# Patient Record
Sex: Female | Born: 1975 | Race: Black or African American | Hispanic: No | Marital: Married | Smoking: Former smoker
Health system: Southern US, Community
[De-identification: ages and names within clinical notes are randomized; demographics above are authoritative.]

## PROBLEM LIST (undated history)

## (undated) DIAGNOSIS — F329 Major depressive disorder, single episode, unspecified: Secondary | ICD-10-CM

## (undated) DIAGNOSIS — N879 Dysplasia of cervix uteri, unspecified: Secondary | ICD-10-CM

## (undated) DIAGNOSIS — R7303 Prediabetes: Secondary | ICD-10-CM

## (undated) DIAGNOSIS — R002 Palpitations: Secondary | ICD-10-CM

## (undated) DIAGNOSIS — D219 Benign neoplasm of connective and other soft tissue, unspecified: Secondary | ICD-10-CM

## (undated) DIAGNOSIS — F419 Anxiety disorder, unspecified: Secondary | ICD-10-CM

## (undated) DIAGNOSIS — Z8041 Family history of malignant neoplasm of ovary: Secondary | ICD-10-CM

## (undated) DIAGNOSIS — E559 Vitamin D deficiency, unspecified: Secondary | ICD-10-CM

## (undated) DIAGNOSIS — F32A Depression, unspecified: Secondary | ICD-10-CM

## (undated) HISTORY — DX: Family history of malignant neoplasm of ovary: Z80.41

## (undated) HISTORY — DX: Palpitations: R00.2

## (undated) HISTORY — DX: Depression, unspecified: F32.A

## (undated) HISTORY — DX: Prediabetes: R73.03

## (undated) HISTORY — PX: LEEP: SHX91

## (undated) HISTORY — DX: Benign neoplasm of connective and other soft tissue, unspecified: D21.9

## (undated) HISTORY — DX: Dysplasia of cervix uteri, unspecified: N87.9

## (undated) HISTORY — DX: Major depressive disorder, single episode, unspecified: F32.9

## (undated) HISTORY — DX: Anxiety disorder, unspecified: F41.9

## (undated) HISTORY — PX: COLPOSCOPY: SHX161

## (undated) HISTORY — DX: Vitamin D deficiency, unspecified: E55.9

---

## 2004-02-09 ENCOUNTER — Ambulatory Visit: Payer: Self-pay | Admitting: Family Medicine

## 2004-08-28 ENCOUNTER — Emergency Department: Payer: Self-pay | Admitting: Emergency Medicine

## 2006-05-16 ENCOUNTER — Ambulatory Visit: Payer: Self-pay | Admitting: Emergency Medicine

## 2006-05-16 IMAGING — CR RIGHT FOURTH TOE
1 series · 3 of 3 positions shown · non-contrast
Comparison: none

REASON FOR EXAM: injury
COMMENTS:   LMP: N/A

[Series 1: view not recorded · 0.17mm/px · 3 of 3 slices shown]
[im 1/3]
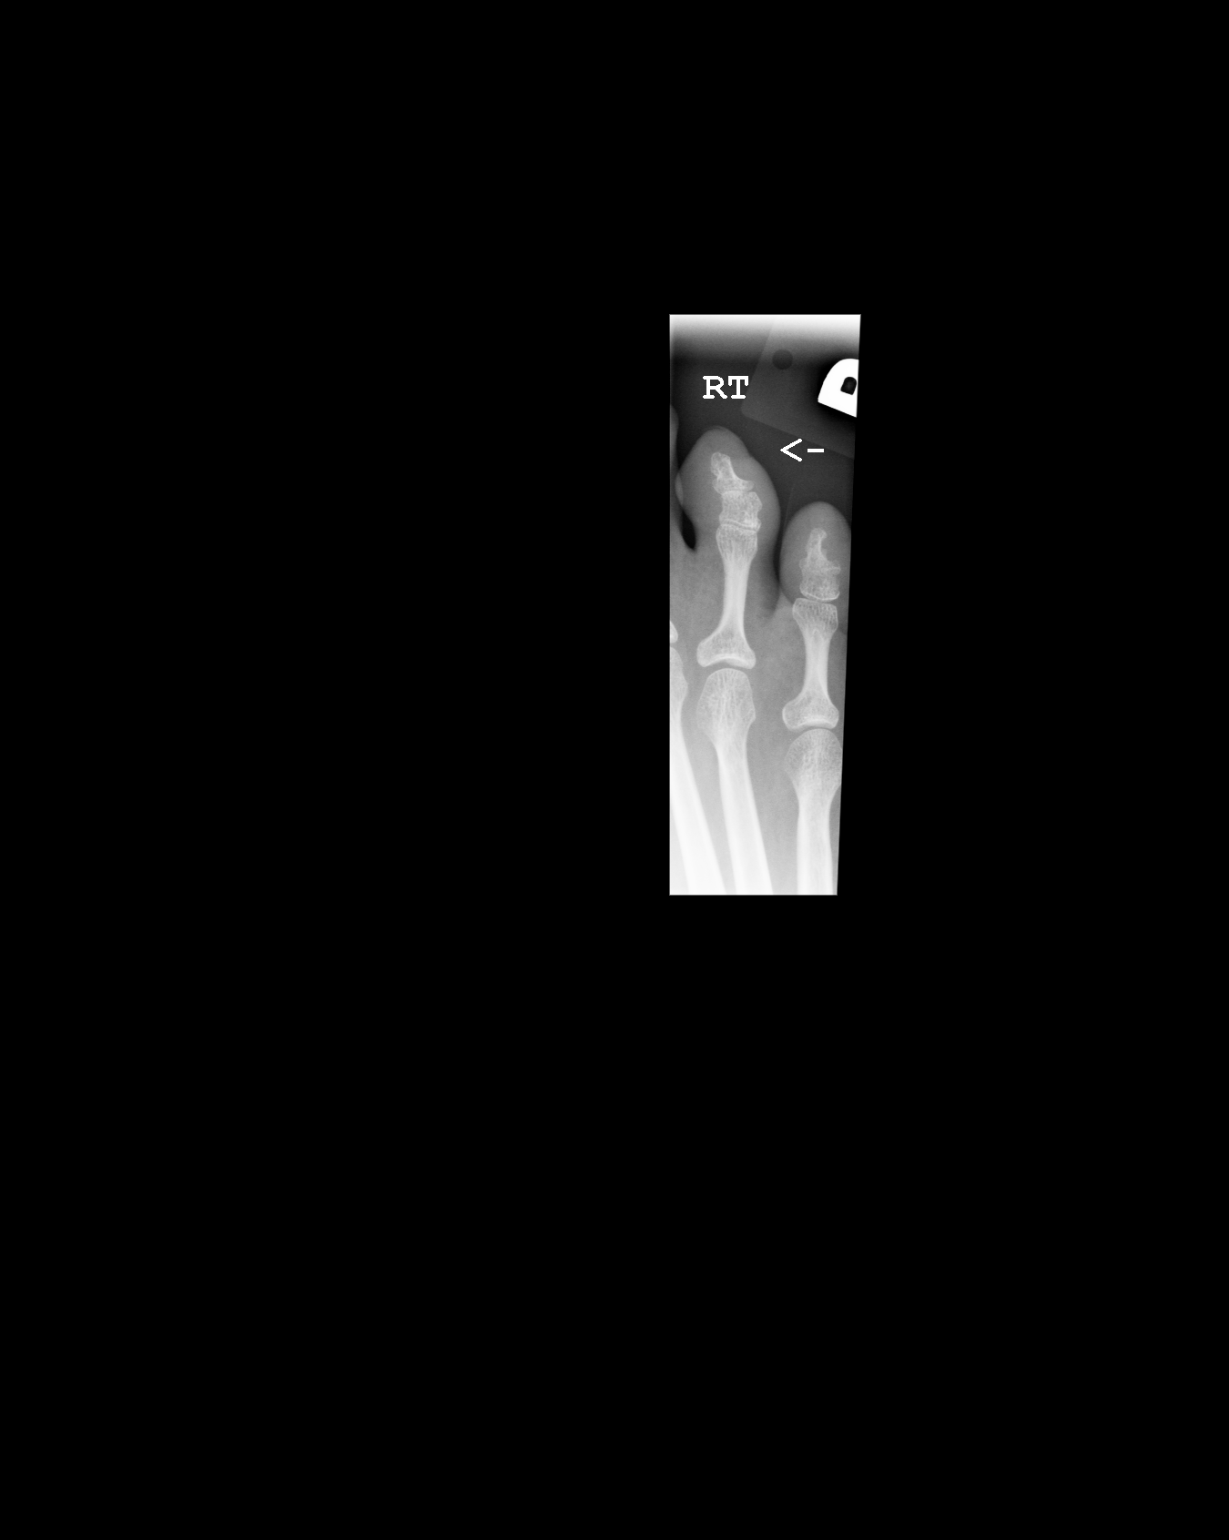
[im 2/3]
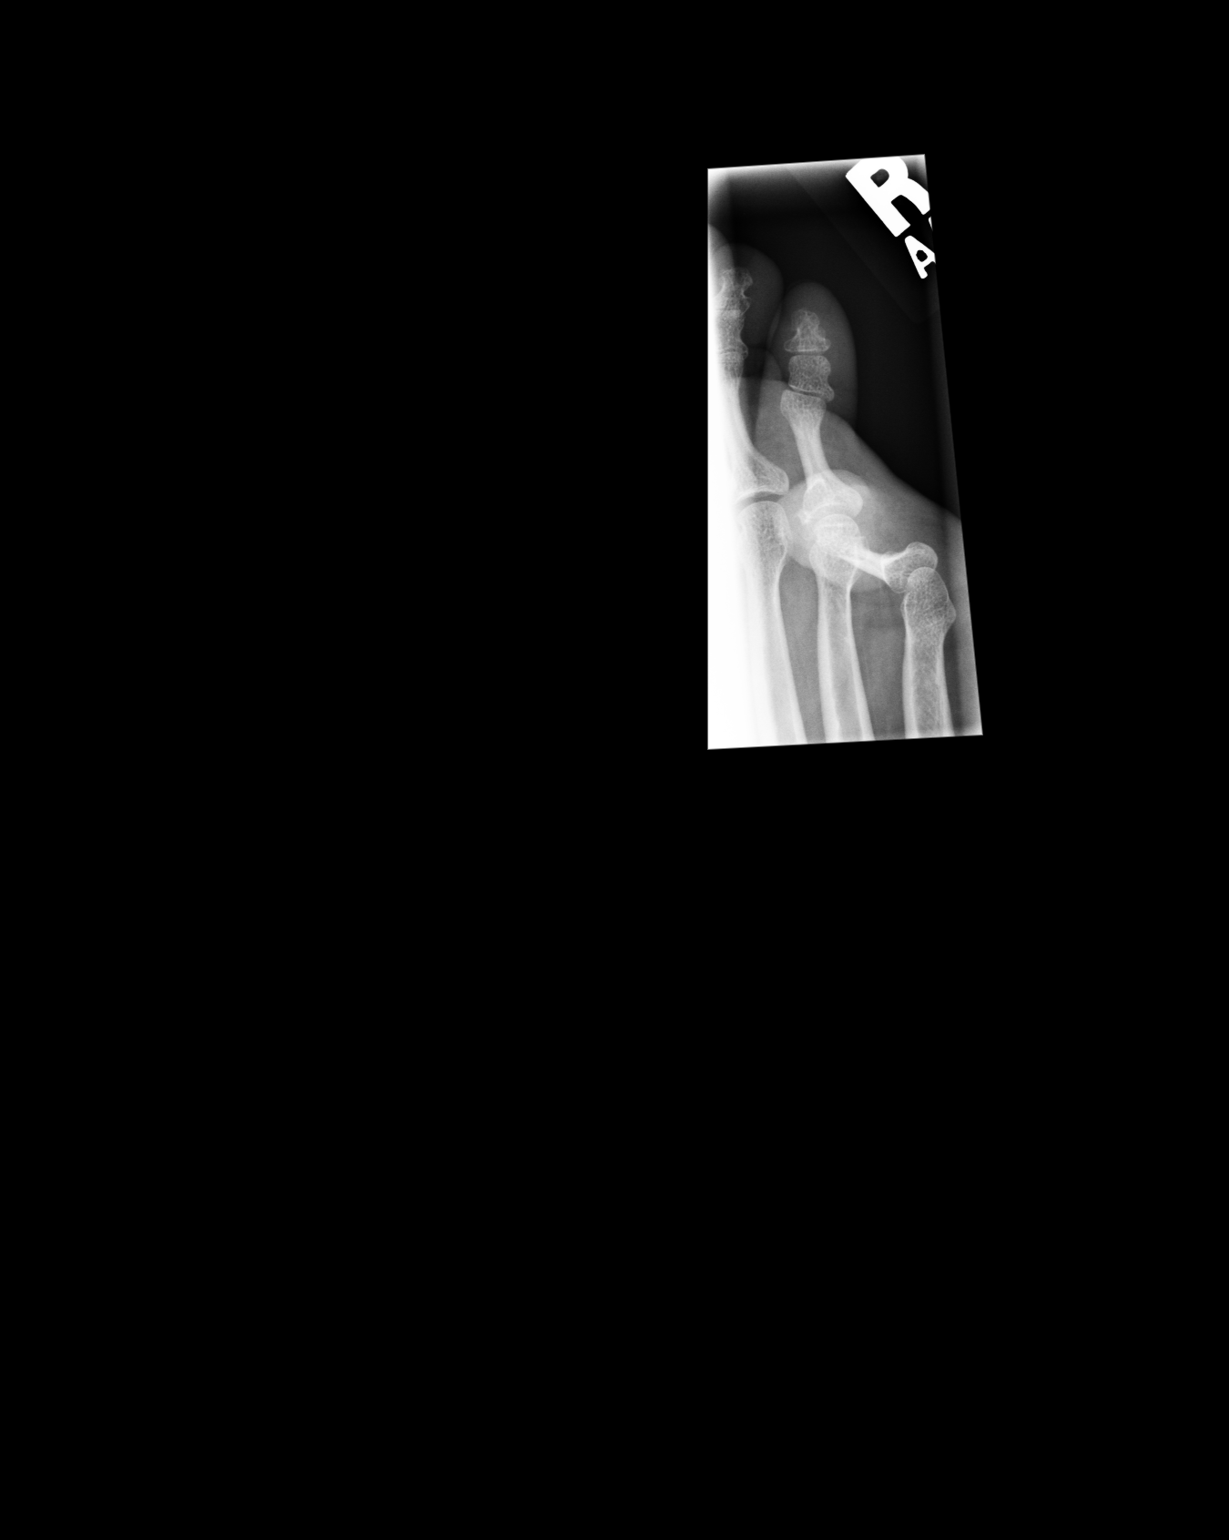
[im 3/3]
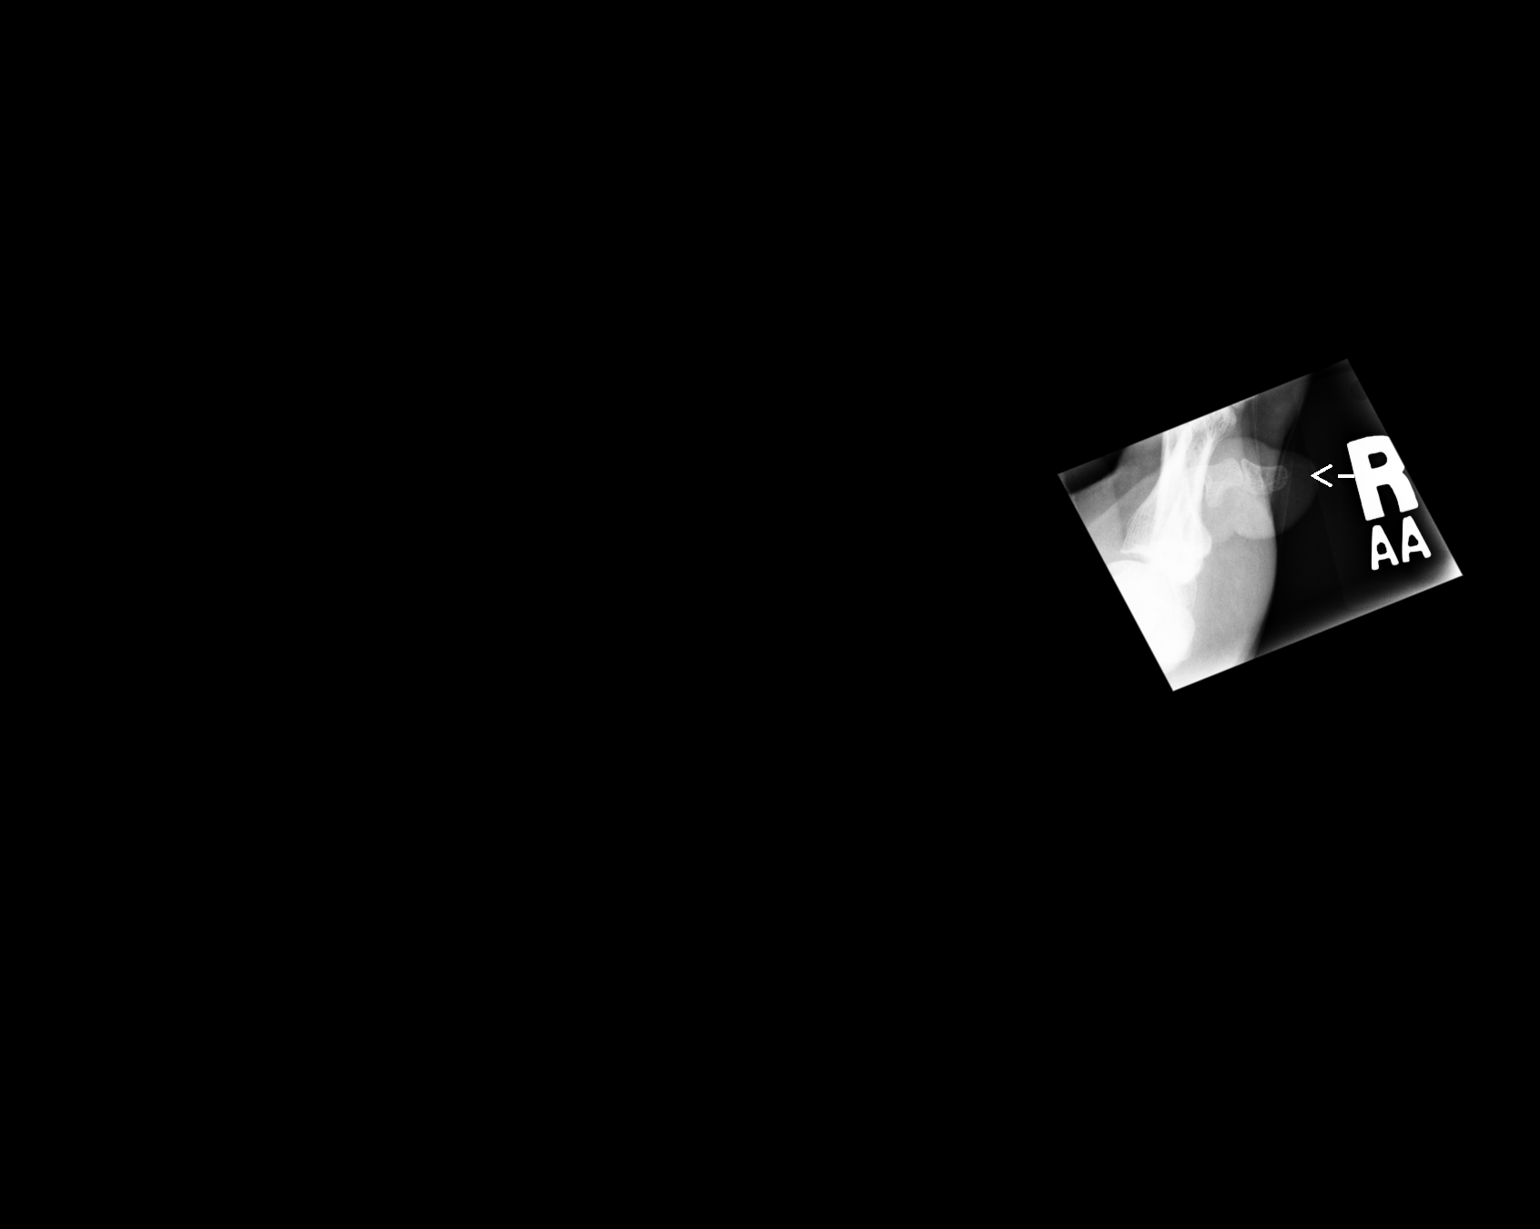

[3 of 3 positions shown; findings below may reference images not displayed]

PROCEDURE:     MDR - MDR TOE 4TH DIGIT RIGHT FOOT  - [DATE]  [DATE]

RESULT:     There does not appear to be evidence of fracture, dislocation or
malalignment.  If there are persistent complaints of pain or persistent
clinical concern, a repeat evaluation in 7-10 days is recommended if
clinically warranted.
IMPRESSION: No radiographic evidence of an overt fracture. A radiooccult fracture cannot
be excluded particularly if of clinical concern. A repeat evaluation can be
obtained in 7-10 days if clinically warranted.

## 2006-06-15 ENCOUNTER — Ambulatory Visit: Payer: Self-pay | Admitting: Emergency Medicine

## 2006-09-21 ENCOUNTER — Ambulatory Visit: Payer: Self-pay | Admitting: Internal Medicine

## 2007-03-12 ENCOUNTER — Ambulatory Visit: Payer: Self-pay | Admitting: Obstetrics and Gynecology

## 2007-03-12 ENCOUNTER — Ambulatory Visit: Payer: Self-pay | Admitting: Family Medicine

## 2007-03-12 IMAGING — US US PELV - US TRANSVAGINAL
1 series · 17 of 25 positions shown · non-contrast
Comparison: none

REASON FOR EXAM: pel pain
COMMENTS:

[Series 1: us pelv - us transvaginal · 17 of 58 slices shown]
[im 1/58]
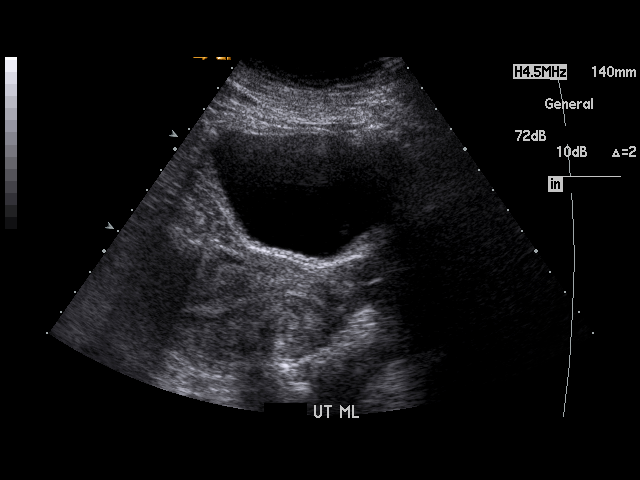
[im 5/58]
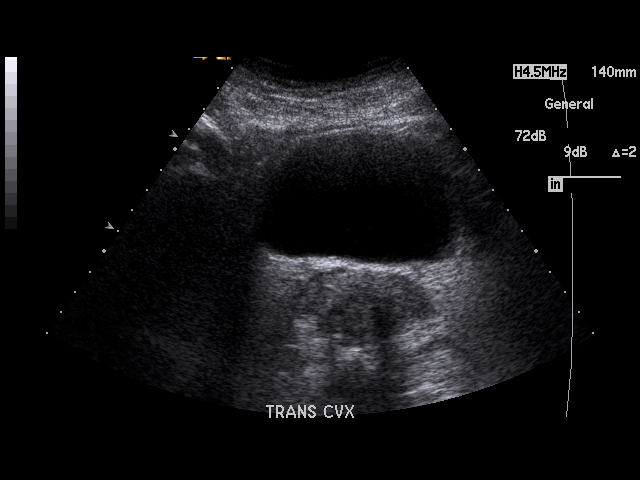
[im 8/58]
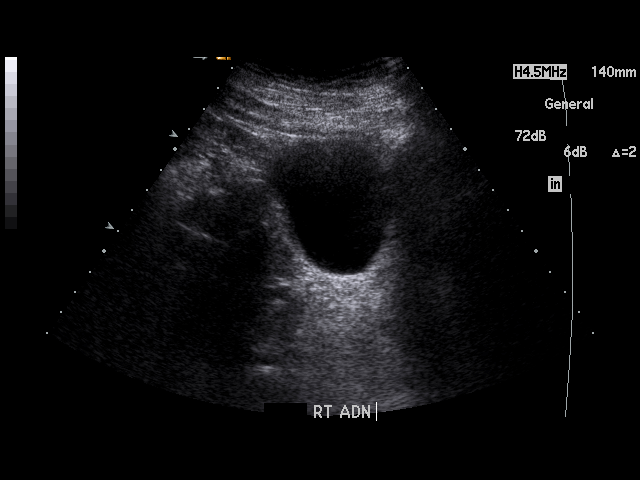
[im 12/58]
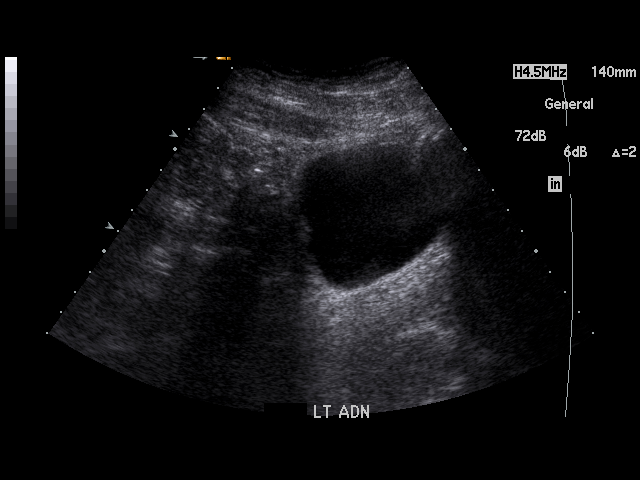
[im 15/58]
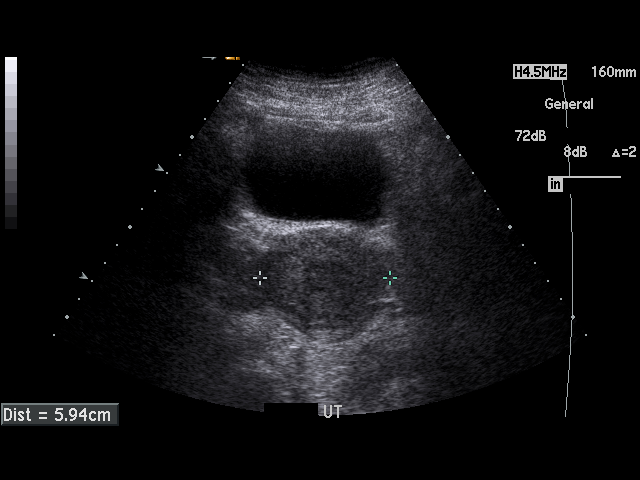
[im 20/58]
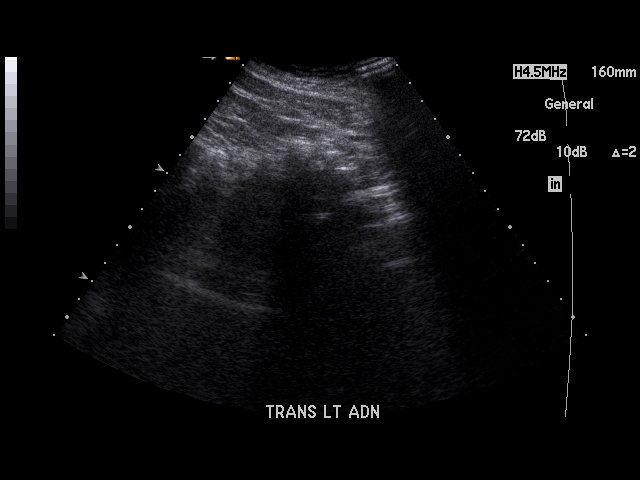
[im 22/58]
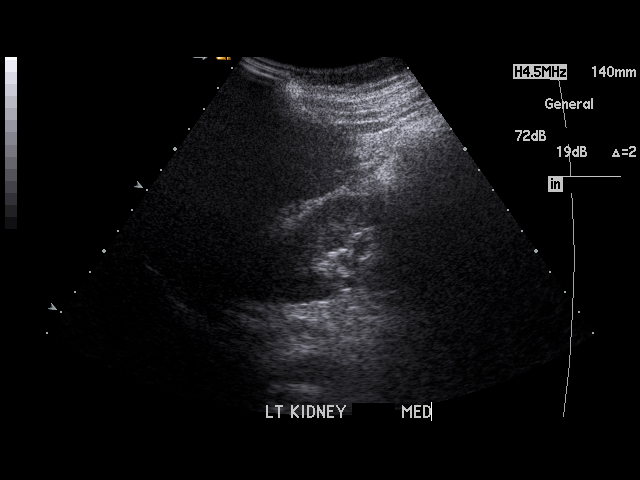
[im 27/58]
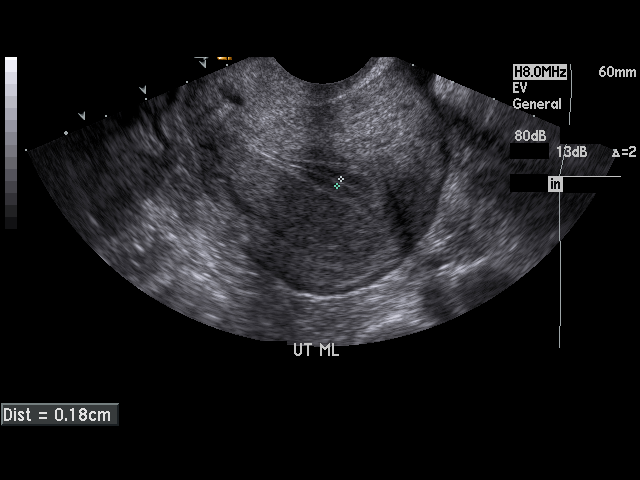
[im 29/58]
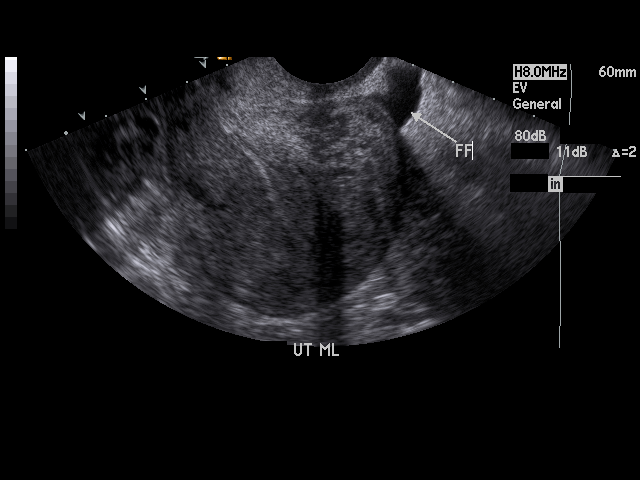
[im 31/58]
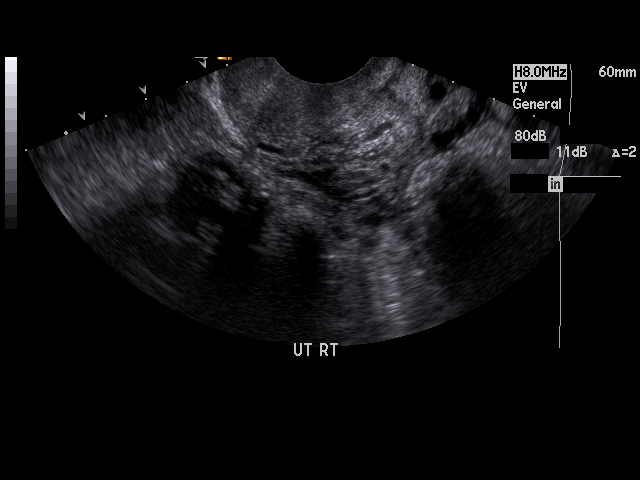
[im 36/58]
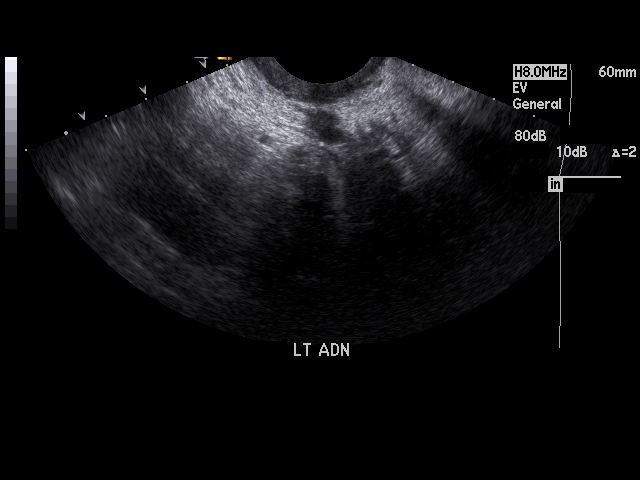
[im 39/58]
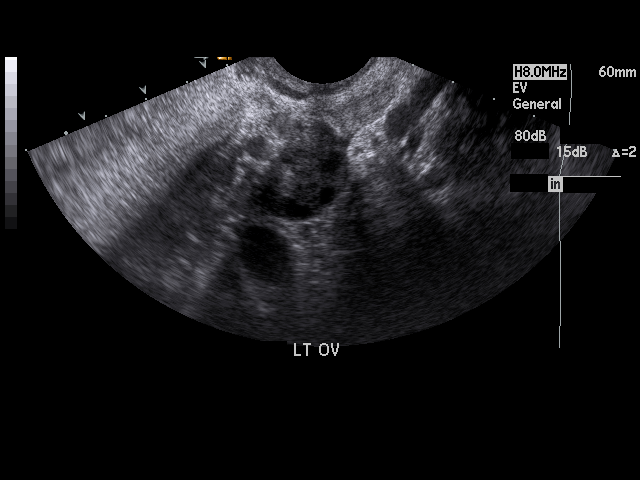
[im 43/58]
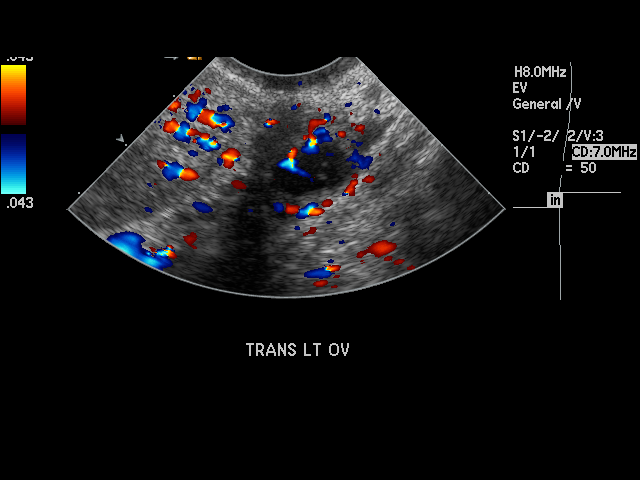
[im 46/58]
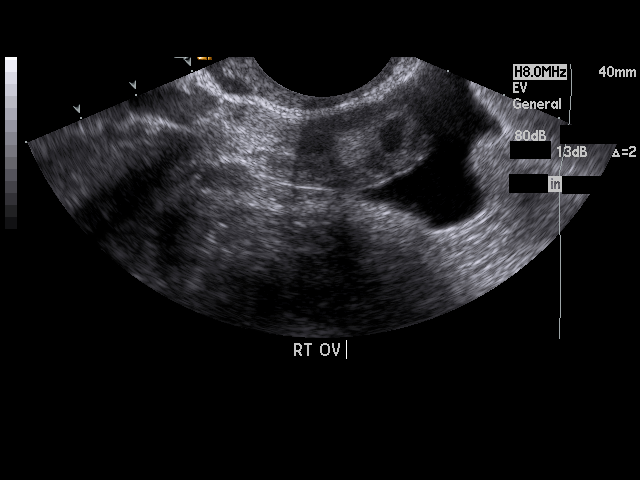
[im 50/58]
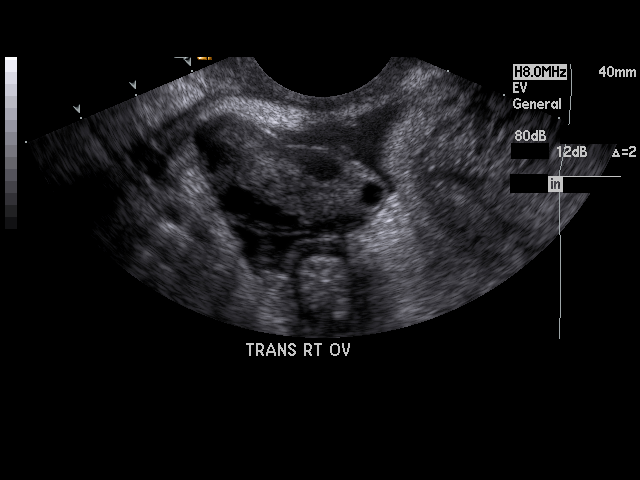
[im 53/58]
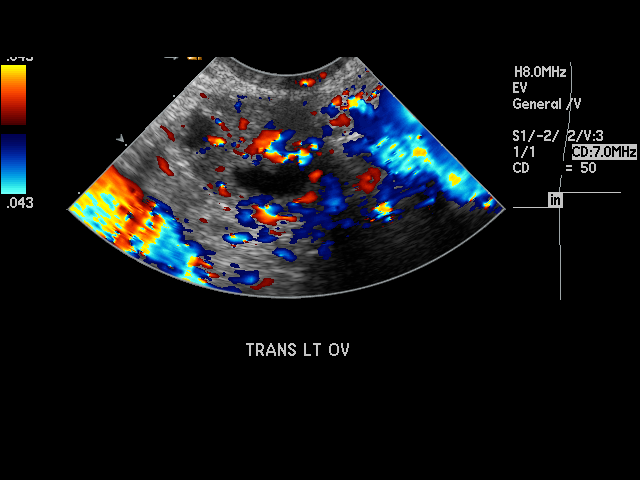
[im 58/58]
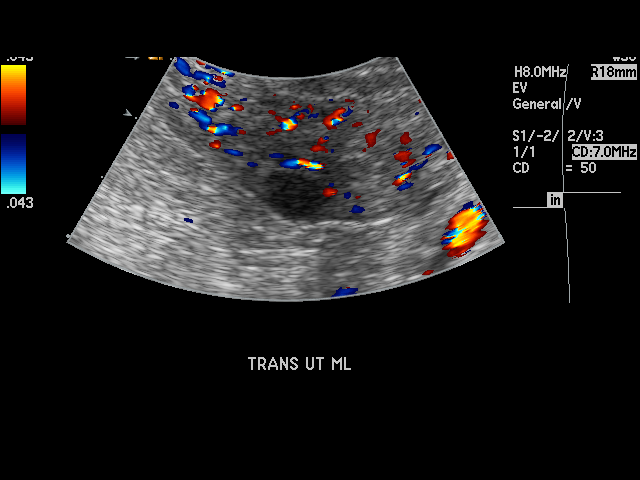

[17 of 25 positions shown; findings below may reference images not displayed]

PROCEDURE:     US  - US PELVIS MASS EXAM  - [DATE]  [DATE] [DATE]  [DATE]

RESULT:     Transabdominal and endovaginal ultrasound was performed.  The
uterus measures 7.93 cm x 5.94 cm x 4.58 cm. There is a 7.3 mm hypoechoic
mass posteriorly in the uterine fundus consistent with a small uterine
fibroid.  The RIGHT and LEFT ovaries are visualized. The RIGHT ovary
measures 3.06 cm at maximum diameter and the LEFT ovary measures 2.5 cm at
maximum diameter. There is a 1.79 cm complex cyst of the LEFT ovary. The
etiology for this is uncertain but a corpus luteal cyst would be one
consideration. There is a nonspecific small amount of free fluid in the
pelvis. The kidneys show no hydronephrosis.
IMPRESSION: 1. The uterus appears normal in size and is retroverted.
2. There is a 7.3 mm hypoechoic mass posteriorly in the uterus compatible
with a tiny uterine fibroid.
3. There is a nonspecific complex cyst of the LEFT ovary measuring 1.79 cm
at maximum diameter.
4. There is nonspecific small amount of free fluid in the pelvis.

## 2007-05-04 ENCOUNTER — Ambulatory Visit: Payer: Self-pay | Admitting: Family Medicine

## 2007-10-28 ENCOUNTER — Ambulatory Visit: Payer: Self-pay | Admitting: Family Medicine

## 2007-12-17 ENCOUNTER — Ambulatory Visit: Payer: Self-pay | Admitting: Internal Medicine

## 2008-02-29 ENCOUNTER — Ambulatory Visit: Payer: Self-pay | Admitting: Internal Medicine

## 2008-03-07 ENCOUNTER — Ambulatory Visit: Payer: Self-pay | Admitting: Internal Medicine

## 2008-10-12 ENCOUNTER — Ambulatory Visit: Payer: Self-pay | Admitting: Internal Medicine

## 2009-04-02 ENCOUNTER — Ambulatory Visit: Payer: Self-pay | Admitting: Internal Medicine

## 2009-04-13 ENCOUNTER — Ambulatory Visit: Payer: Self-pay | Admitting: Internal Medicine

## 2009-11-14 ENCOUNTER — Ambulatory Visit: Payer: Self-pay | Admitting: Internal Medicine

## 2009-12-27 ENCOUNTER — Ambulatory Visit: Payer: Self-pay | Admitting: Family Medicine

## 2012-01-08 DIAGNOSIS — D219 Benign neoplasm of connective and other soft tissue, unspecified: Secondary | ICD-10-CM

## 2012-01-08 HISTORY — DX: Benign neoplasm of connective and other soft tissue, unspecified: D21.9

## 2014-01-02 ENCOUNTER — Ambulatory Visit: Payer: Self-pay | Admitting: Family Medicine

## 2014-01-02 LAB — RAPID STREP-A WITH REFLX: MICRO TEXT REPORT: NEGATIVE

## 2014-01-05 LAB — BETA STREP CULTURE(ARMC)

## 2015-02-05 ENCOUNTER — Ambulatory Visit
Admission: EM | Admit: 2015-02-05 | Discharge: 2015-02-05 | Disposition: A | Discharge status: Home / Self Care | Payer: Federal, State, Local not specified - PPO | Attending: Family Medicine | Admitting: Family Medicine

## 2015-02-05 ENCOUNTER — Encounter: Payer: Self-pay | Admitting: Gynecology

## 2015-02-05 DIAGNOSIS — H6593 Unspecified nonsuppurative otitis media, bilateral: Secondary | ICD-10-CM | POA: Diagnosis not present

## 2015-02-05 DIAGNOSIS — J019 Acute sinusitis, unspecified: Secondary | ICD-10-CM

## 2015-02-05 DIAGNOSIS — J039 Acute tonsillitis, unspecified: Secondary | ICD-10-CM

## 2015-02-05 LAB — RAPID STREP SCREEN (MED CTR MEBANE ONLY): STREPTOCOCCUS, GROUP A SCREEN (DIRECT): NEGATIVE

## 2015-02-05 MED ORDER — PREDNISONE 20 MG PO TABS: 40.0000 mg | ORAL_TABLET | Freq: Every day | ORAL | Status: DC

## 2015-02-05 MED ORDER — AMOXICILLIN-POT CLAVULANATE 875-125 MG PO TABS: 1.0000 | ORAL_TABLET | Freq: Two times a day (BID) | ORAL | Status: AC

## 2015-02-05 MED ORDER — ACETAMINOPHEN 500 MG PO TABS: 1000.0000 mg | ORAL_TABLET | Freq: Four times a day (QID) | ORAL | Status: DC | PRN

## 2015-02-05 MED ORDER — FLUTICASONE PROPIONATE 50 MCG/ACT NA SUSP: 1.0000 | Freq: Two times a day (BID) | NASAL | Status: DC

## 2015-02-05 MED ORDER — FIRST-DUKES MOUTHWASH MT SUSP: 15.0000 mL | Freq: Three times a day (TID) | OROMUCOSAL | Status: AC | PRN

## 2015-02-05 MED ORDER — SALINE SPRAY 0.65 % NA SOLN: 2.0000 | NASAL | Status: DC

## 2015-02-05 NOTE — ED Notes (Signed)
Patient c/o sneezing  / nasal congestion /ear ache / facial pressure x 6 days.

## 2015-02-05 NOTE — Discharge Instructions (Signed)
Otitis Media With Effusion Otitis media with effusion is the presence of fluid in the middle ear. This is a common problem in children, which often follows ear infections. It may be present for weeks or longer after the infection. Unlike an acute ear infection, otitis media with effusion refers only to fluid behind the ear drum and not infection. Children with repeated ear and sinus infections and allergy problems are the most likely to get otitis media with effusion. CAUSES  The most frequent cause of the fluid buildup is dysfunction of the eustachian tubes. These are the tubes that drain fluid in the ears to the back of the nose (nasopharynx). SYMPTOMS   The main symptom of this condition is hearing loss. As a result, you or your child may:  Listen to the TV at a loud volume.  Not respond to questions.  Ask "what" often when spoken to.  Mistake or confuse one sound or word for another.  There may be a sensation of fullness or pressure but usually not pain. DIAGNOSIS   Your health care provider will diagnose this condition by examining you or your child's ears.  Your health care provider may test the pressure in you or your child's ear with a tympanometer.  A hearing test may be conducted if the problem persists. TREATMENT   Treatment depends on the duration and the effects of the effusion.  Antibiotics, decongestants, nose drops, and cortisone-type drugs (tablets or nasal spray) may not be helpful.  Children with persistent ear effusions may have delayed language or behavioral problems. Children at risk for developmental delays in hearing, learning, and speech may require referral to a specialist earlier than children not at risk.  You or your child's health care provider may suggest a referral to an ear, nose, and throat surgeon for treatment. The following may help restore normal hearing:  Drainage of fluid.  Placement of ear tubes (tympanostomy tubes).  Removal of adenoids  (adenoidectomy). HOME CARE INSTRUCTIONS   Avoid secondhand smoke.  Infants who are breastfed are less likely to have this condition.  Avoid feeding infants while they are lying flat.  Avoid known environmental allergens.  Avoid people who are sick. SEEK MEDICAL CARE IF:   Hearing is not better in 3 months.  Hearing is worse.  Ear pain.  Drainage from the ear.  Dizziness. MAKE SURE YOU:   Understand these instructions.  Will watch your condition.  Will get help right away if you are not doing well or get worse.   This information is not intended to replace advice given to you by your health care provider. Make sure you discuss any questions you have with your health care provider.   Document Released: 02/01/2004 Document Revised: 01/14/2014 Document Reviewed: 07/21/2012 Elsevier Interactive Patient Education 2016 [employer] Upper Respiratory Infection, Adult Most upper respiratory infections (URIs) are a viral infection of the air passages leading to the lungs. A URI affects the nose, throat, and upper air passages. The most common type of URI is nasopharyngitis and is typically referred to as "the common cold." URIs run their course and usually go away on their own. Most of the time, a URI does not require medical attention, but sometimes a bacterial infection in the upper airways can follow a viral infection. This is called a secondary infection. Sinus and middle ear infections are common types of secondary upper respiratory infections. Bacterial pneumonia can also complicate a URI. A URI can worsen asthma and chronic obstructive pulmonary disease (  COPD). Sometimes, these complications can require emergency medical care and may be life threatening.  CAUSES Almost all URIs are caused by viruses. A virus is a type of germ and can spread from one person to another.  RISKS FACTORS You may be at risk for a URI if:   You smoke.   You have chronic heart or lung  disease.  You have a weakened defense (immune) system.   You are very young or very old.   You have nasal allergies or asthma.  You work in crowded or poorly ventilated areas.  You work in health care facilities or schools. SIGNS AND SYMPTOMS  Symptoms typically develop 2-3 days after you come in contact with a cold virus. Most viral URIs last 7-10 days. However, viral URIs from the influenza virus (flu virus) can last 14-18 days and are typically more severe. Symptoms may include:   Runny or stuffy (congested) nose.   Sneezing.   Cough.   Sore throat.   Headache.   Fatigue.   Fever.   Loss of appetite.   Pain in your forehead, behind your eyes, and over your cheekbones (sinus pain).  Muscle aches.  DIAGNOSIS  Your health care provider may diagnose a URI by:  Physical exam.  Tests to check that your symptoms are not due to another condition such as:  Strep throat.  Sinusitis.  Pneumonia.  Asthma. TREATMENT  A URI goes away on its own with time. It cannot be cured with medicines, but medicines may be prescribed or recommended to relieve symptoms. Medicines may help:  Reduce your fever.  Reduce your cough.  Relieve nasal congestion. HOME CARE INSTRUCTIONS   Take medicines only as directed by your health care provider.   Gargle warm saltwater or take cough drops to comfort your throat as directed by your health care provider.  Use a warm mist humidifier or inhale steam from a shower to increase air moisture. This may make it easier to breathe.  Drink enough fluid to keep your urine clear or pale yellow.   Eat soups and other clear broths and maintain good nutrition.   Rest as needed.   Return to work when your temperature has returned to normal or as your health care provider advises. You may need to stay home longer to avoid infecting others. You can also use a face mask and careful hand washing to prevent spread of the  virus.  Increase the usage of your inhaler if you have asthma.   Do not use any tobacco products, including cigarettes, chewing tobacco, or electronic cigarettes. If you need help quitting, ask your health care provider. PREVENTION  The best way to protect yourself from getting a cold is to practice good hygiene.   Avoid oral or hand contact with people with cold symptoms.   Wash your hands often if contact occurs.  There is no clear evidence that vitamin C, vitamin E, echinacea, or exercise reduces the chance of developing a cold. However, it is always recommended to get plenty of rest, exercise, and practice good nutrition.  SEEK MEDICAL CARE IF:   You are getting worse rather than better.   Your symptoms are not controlled by medicine.   You have chills.  You have worsening shortness of breath.  You have brown or red mucus.  You have yellow or brown nasal discharge.  You have pain in your face, especially when you bend forward.  You have a fever.  You have swollen neck glands.  You have pain while swallowing.  You have white areas in the back of your throat. SEEK IMMEDIATE MEDICAL CARE IF:   You have severe or persistent:  Headache.  Ear pain.  Sinus pain.  Chest pain.  You have chronic lung disease and any of the following:  Wheezing.  Prolonged cough.  Coughing up blood.  A change in your usual mucus.  You have a stiff neck.  You have changes in your:  Vision.  Hearing.  Thinking.  Mood. MAKE SURE YOU:   Understand these instructions.  Will watch your condition.  Will get help right away if you are not doing well or get worse.   This information is not intended to replace advice given to you by your health care provider. Make sure you discuss any questions you have with your health care provider.   Document Released: 06/19/2000 Document Revised: 05/10/2014 Document Reviewed: 03/31/2013 Elsevier Interactive Patient Education  2016 [employer] Sinusitis, Adult Sinusitis is redness, soreness, and inflammation of the paranasal sinuses. Paranasal sinuses are air pockets within the bones of your face. They are located beneath your eyes, in the middle of your forehead, and above your eyes. In healthy paranasal sinuses, mucus is able to drain out, and air is able to circulate through them by way of your nose. However, when your paranasal sinuses are inflamed, mucus and air can become trapped. This can allow bacteria and other germs to grow and cause infection. Sinusitis can develop quickly and last only a short time (acute) or continue over a long period (chronic). Sinusitis that lasts for more than 12 weeks is considered chronic. CAUSES Causes of sinusitis include:  Allergies.  Structural abnormalities, such as displacement of the cartilage that separates your nostrils (deviated septum), which can decrease the air flow through your nose and sinuses and affect sinus drainage.  Functional abnormalities, such as when the small hairs (cilia) that line your sinuses and help remove mucus do not work properly or are not present. SIGNS AND SYMPTOMS Symptoms of acute and chronic sinusitis are the same. The primary symptoms are pain and pressure around the affected sinuses. Other symptoms include:  Upper toothache.  Earache.  Headache.  Bad breath.  Decreased sense of smell and taste.  A cough, which worsens when you are lying flat.  Fatigue.  Fever.  Thick drainage from your nose, which often is green and may contain pus (purulent).  Swelling and warmth over the affected sinuses. DIAGNOSIS Your health care provider will perform a physical exam. During your exam, your health care provider may perform any of the following to help determine if you have acute sinusitis or chronic sinusitis:  Look in your nose for signs of abnormal growths in your nostrils (nasal polyps).  Tap over the affected sinus to check for  signs of infection.  View the inside of your sinuses using an imaging device that has a light attached (endoscope). If your health care provider suspects that you have chronic sinusitis, one or more of the following tests may be recommended:  Allergy tests.  Nasal culture. A sample of mucus is taken from your nose, sent to a lab, and screened for bacteria.  Nasal cytology. A sample of mucus is taken from your nose and examined by your health care provider to determine if your sinusitis is related to an allergy. TREATMENT Most cases of acute sinusitis are related to a viral infection and will resolve on their own within 10 days. Sometimes, medicines are prescribed  to help relieve symptoms of both acute and chronic sinusitis. These may include pain medicines, decongestants, nasal steroid sprays, or saline sprays. However, for sinusitis related to a bacterial infection, your health care provider will prescribe antibiotic medicines. These are medicines that will help kill the bacteria causing the infection. Rarely, sinusitis is caused by a fungal infection. In these cases, your health care provider will prescribe antifungal medicine. For some cases of chronic sinusitis, surgery is needed. Generally, these are cases in which sinusitis recurs more than 3 times per year, despite other treatments. HOME CARE INSTRUCTIONS  Drink plenty of water. Water helps thin the mucus so your sinuses can drain more easily.  Use a humidifier.  Inhale steam 3-4 times a day (for example, sit in the bathroom with the shower running).  Apply a warm, moist washcloth to your face 3-4 times a day, or as directed by your health care provider.  Use saline nasal sprays to help moisten and clean your sinuses.  Take medicines only as directed by your health care provider.  If you were prescribed either an antibiotic or antifungal medicine, finish it all even if you start to feel better. SEEK IMMEDIATE MEDICAL CARE  IF:  You have increasing pain or severe headaches.  You have nausea, vomiting, or drowsiness.  You have swelling around your face.  You have vision problems.  You have a stiff neck.  You have difficulty breathing.   This information is not intended to replace advice given to you by your health care provider. Make sure you discuss any questions you have with your health care provider.   Document Released: 12/24/2004 Document Revised: 01/14/2014 Document Reviewed: 01/08/2011 Elsevier Interactive Patient Education 2016 [employer] Tonsillitis Tonsillitis is an infection of the throat that causes the tonsils to become red, tender, and swollen. Tonsils are collections of lymphoid tissue at the back of the throat. Each tonsil has crevices (crypts). Tonsils help fight nose and throat infections and keep infection from spreading to other parts of the body for the first 18 months of life.  CAUSES Sudden (acute) tonsillitis is usually caused by infection with streptococcal bacteria. Long-lasting (chronic) tonsillitis occurs when the crypts of the tonsils become filled with pieces of food and bacteria, which makes it easy for the tonsils to become repeatedly infected. SYMPTOMS  Symptoms of tonsillitis include:  A sore throat, with possible difficulty swallowing.  White patches on the tonsils.  Fever.  Tiredness.  New episodes of snoring during sleep, when you did not snore before.  Small, foul-smelling, yellowish-white pieces of material (tonsilloliths) that you occasionally cough up or spit out. The tonsilloliths can also cause you to have bad breath. DIAGNOSIS Tonsillitis can be diagnosed through a physical exam. Diagnosis can be confirmed with the results of lab tests, including a throat culture. TREATMENT  The goals of tonsillitis treatment include the reduction of the severity and duration of symptoms and prevention of associated conditions. Symptoms of tonsillitis can be improved  with the use of steroids to reduce the swelling. Tonsillitis caused by bacteria can be treated with antibiotic medicines. Usually, treatment with antibiotic medicines is started before the cause of the tonsillitis is known. However, if it is determined that the cause is not bacterial, antibiotic medicines will not treat the tonsillitis. If attacks of tonsillitis are severe and frequent, your health care provider may recommend surgery to remove the tonsils (tonsillectomy). HOME CARE INSTRUCTIONS   Rest as much as possible and get plenty of sleep.  Drink  plenty of fluids. While the throat is very sore, eat soft foods or liquids, such as sherbet, soups, or instant breakfast drinks.  Eat frozen ice pops.  Gargle with a warm or cold liquid to help soothe the throat. Mix 1/4 teaspoon of salt and 1/4 teaspoon of baking soda in 8 oz of water. SEEK MEDICAL CARE IF:   Large, tender lumps develop in your neck.  A rash develops.  A green, yellow-brown, or bloody substance is coughed up.  You are unable to swallow liquids or food for 24 hours.  You notice that only one of the tonsils is swollen. SEEK IMMEDIATE MEDICAL CARE IF:   You develop any new symptoms such as vomiting, severe headache, stiff neck, chest pain, or trouble breathing or swallowing.  You have severe throat pain along with drooling or voice changes.  You have severe pain, unrelieved with recommended medications.  You are unable to fully open the mouth.  You develop redness, swelling, or severe pain anywhere in the neck.  You have a fever. MAKE SURE YOU:   Understand these instructions.  Will watch your condition.  Will get help right away if you are not doing well or get worse.   This information is not intended to replace advice given to you by your health care provider. Make sure you discuss any questions you have with your health care provider.   Document Released: 10/03/2004 Document Revised: 01/14/2014 Document  Reviewed: 06/12/2012 Elsevier Interactive Patient Education [employer].

## 2015-02-05 NOTE — ED Provider Notes (Signed)
CSN: [id number]     Arrival date & time 02/05/15  1138 History   First MD Initiated Contact with Patient 02/05/15 1318     Chief Complaint  Patient presents with  . URI   (Consider location/radiation/quality/duration/timing/severity/associated sxs/prior Treatment) HPI Comments: Married african american female works [employer] sick a couple weeks ago doesn't have patient contact; 7 days ago rhinitis clear progressed to yellow now green; ears popping next hoarse voice x 3 days drank a lot of tea with honey and lemon voice mostly back today throat feeling better.  Using advil cold and sinus also doesn't notice any difference with use in symptoms  Denied sick contacts at home established patient, fatigue, sneezing  Patient is a 40 y.o. female presenting with URI. The history is provided by the patient. No language interpreter was used.  URI Presenting symptoms: congestion, cough, ear pain, facial pain, fatigue, rhinorrhea and sore throat   Presenting symptoms: no fever   Congestion:    Location:  Nasal   Interferes with sleep: no     Interferes with eating/drinking: no   Ear pain:    Location:  Bilateral   Severity:  Mild   Onset quality:  Sudden   Duration:  6 days   Progression:  Waxing and waning   Chronicity:  New Rhinorrhea:    Quality:  Green, yellow and clear   Severity:  Mild   Duration:  6 days   Timing:  Intermittent   Progression:  Waxing and waning Sore throat:    Severity:  Mild   Onset quality:  Sudden   Duration:  3 days   Timing:  Constant   Progression:  Partially resolved Severity:  Mild Onset quality:  Sudden Duration:  3 days Timing:  Constant Progression:  Partially resolved Chronicity:  New Relieved by:  Drinking, hot fluids, rest and OTC medications Worsened by:  Eating and drinking Ineffective treatments:  Certain positions, decongestant, drinking, hot fluids, OTC medications and rest Associated symptoms: headaches    Associated symptoms: no arthralgias, no myalgias, no neck pain, no sinus pain, no sneezing, no swollen glands and no wheezing   Headaches:    Severity:  Mild   Onset quality:  Sudden   Duration:  3 days   Timing:  Intermittent   Progression:  Resolved   Chronicity:  New Risk factors: recent illness   Risk factors: not elderly, no chronic cardiac disease, no chronic kidney disease, no chronic respiratory disease, no diabetes mellitus, no immunosuppression, no recent travel and no sick contacts     History reviewed. No pertinent past medical history. History reviewed. No pertinent past surgical history. History reviewed. No pertinent family history. Social History  Substance Use Topics  . Smoking status: Never Smoker   . Smokeless tobacco: None  . Alcohol Use: No   OB History    No data available     Review of Systems  Constitutional: Positive for fatigue. Negative for fever, chills, diaphoresis, activity change and appetite change.  HENT: Positive for congestion, ear pain, nosebleeds, postnasal drip, rhinorrhea, sinus pressure, sore throat and voice change. Negative for dental problem, ear discharge, facial swelling, hearing loss, mouth sores and sneezing.   Eyes: Negative for photophobia, pain, discharge, redness, itching and visual disturbance.  Respiratory: Positive for cough. Negative for choking, chest tightness, shortness of breath, wheezing and stridor.   Cardiovascular: Negative for chest pain, palpitations and leg swelling.  Gastrointestinal: Negative for nausea, vomiting, abdominal pain, diarrhea, constipation, blood  in stool, abdominal distention, anal bleeding and rectal pain.  Endocrine: Negative for cold intolerance and heat intolerance.  Genitourinary: Negative for dysuria and difficulty urinating.  Musculoskeletal: Negative for myalgias, back pain, joint swelling, arthralgias, gait problem, neck pain and neck stiffness.  Skin: Negative for color change, pallor,  rash and wound.  Allergic/Immunologic: Negative for environmental allergies.  Neurological: Positive for headaches. Negative for dizziness, tremors, seizures, syncope, facial asymmetry, speech difficulty, weakness, light-headedness and numbness.  Hematological: Negative for adenopathy. Does not bruise/bleed easily.  Psychiatric/Behavioral: Negative for behavioral problems, confusion, sleep disturbance and agitation.    Allergies  Review of patient's allergies indicates no known allergies.  Home Medications   Prior to Admission medications   Medication Sig Start Date End Date Taking? Authorizing Provider  acetaminophen (TYLENOL) 500 MG tablet Take 2 tablets (1,000 mg total) by mouth every 6 (six) hours as needed for mild pain, moderate pain, fever or headache. 02/05/15   Olen Cordial, NP  amoxicillin-clavulanate (AUGMENTIN) 875-125 MG tablet Take 1 tablet by mouth every 12 (twelve) hours. 02/08/15 02/17/15  Olen Cordial, NP  Diphenhyd-Hydrocort-Nystatin (FIRST-DUKES MOUTHWASH) SUSP Use as directed 15 mLs in the mouth or throat 3 (three) times daily as needed. 02/05/15 02/11/15  Olen Cordial, NP  fluticasone (FLONASE) 50 MCG/ACT nasal spray Place 1 spray into both nostrils 2 (two) times daily. 02/05/15   Olen Cordial, NP  predniSONE (DELTASONE) 20 MG tablet Take 2 tablets (40 mg total) by mouth daily with breakfast. 02/05/15   Olen Cordial, NP  sodium chloride (OCEAN) 0.65 % SOLN nasal spray Place 2 sprays into both nostrils every 2 (two) hours while awake. 02/05/15   Olen Cordial, NP   Meds Ordered and Administered this Visit  Medications - No data to display  BP 121/53 mmHg  Pulse 91  Temp(Src) 99.4 F (37.4 C) (Oral)  Resp 18  Ht 5\' 7"  (1.702 m)  Wt 185 lb (83.915 kg)  BMI 28.97 kg/m2  SpO2 100%  LMP 01/15/2015 No data found.   Physical Exam  Constitutional: She is oriented to person, place, and time. She appears well-developed and well-nourished. She is  active and cooperative.  Non-toxic appearance. She does not have a sickly appearance. She appears ill. No distress.  HENT:  Head: Normocephalic and atraumatic.  Right Ear: Hearing, external ear and ear canal normal. A middle ear effusion is present.  Left Ear: Hearing, external ear and ear canal normal. A middle ear effusion is present.  Nose: Mucosal edema and rhinorrhea present. No nose lacerations, sinus tenderness, nasal deformity, septal deviation or nasal septal hematoma. No epistaxis.  No foreign bodies. Right sinus exhibits no maxillary sinus tenderness and no frontal sinus tenderness. Left sinus exhibits no maxillary sinus tenderness and no frontal sinus tenderness.  Mouth/Throat: Uvula is midline and mucous membranes are normal. Mucous membranes are not pale, not dry and not cyanotic. She does not have dentures. No oral lesions. No trismus in the jaw. Normal dentition. No dental abscesses, uvula swelling, lacerations or dental caries. Oropharyngeal exudate, posterior oropharyngeal edema and posterior oropharyngeal erythema present. No tonsillar abscesses.  Left tonsils with exudate; bilateral tonsils 2+ symmetrical erythema/edema; cobblestoning posterior pharynx; bilateral TMs with air fluid level clear; bilateral external canals with mild erythema 6 oclock position; bilateral nasal turbinates with edema/erythema yellow discharge  Eyes: Conjunctivae, EOM and lids are normal. Pupils are equal, round, and reactive to light. Right eye exhibits no chemosis, no discharge, no exudate and no hordeolum.  No foreign body present in the right eye. Left eye exhibits no chemosis, no discharge, no exudate and no hordeolum. No foreign body present in the left eye. Right conjunctiva is not injected. Right conjunctiva has no hemorrhage. Left conjunctiva is not injected. Left conjunctiva has no hemorrhage. No scleral icterus. Right eye exhibits normal extraocular motion and no nystagmus. Left eye exhibits normal  extraocular motion and no nystagmus. Right pupil is round and reactive. Left pupil is round and reactive. Pupils are equal.  Neck: Trachea normal and normal range of motion. Neck supple. No tracheal tenderness, no spinous process tenderness and no muscular tenderness present. No rigidity. No tracheal deviation, no edema, no erythema and normal range of motion present. No thyroid mass and no thyromegaly present.  Cardiovascular: Normal rate, regular rhythm, S1 normal, S2 normal, normal heart sounds and intact distal pulses.  PMI is not displaced.  Exam reveals no gallop and no friction rub.   No murmur heard. Pulmonary/Chest: Effort normal and breath sounds normal. No accessory muscle usage or stridor. No respiratory distress. She has no decreased breath sounds. She has no wheezes. She has no rhonchi. She has no rales. She exhibits no tenderness.  Abdominal: Soft. She exhibits no distension.  Musculoskeletal: Normal range of motion. She exhibits no edema or tenderness.       Right shoulder: Normal.       Left shoulder: Normal.       Right hip: Normal.       Left hip: Normal.       Right knee: Normal.       Left knee: Normal.       Cervical back: Normal.       Right hand: Normal.       Left hand: Normal.  Lymphadenopathy:       Head (right side): No submental, no submandibular, no tonsillar, no preauricular, no posterior auricular and no occipital adenopathy present.       Head (left side): No submental, no submandibular, no tonsillar, no preauricular, no posterior auricular and no occipital adenopathy present.    She has no cervical adenopathy.       Right cervical: No superficial cervical, no deep cervical and no posterior cervical adenopathy present.      Left cervical: No superficial cervical, no deep cervical and no posterior cervical adenopathy present.  Neurological: She is alert and oriented to person, place, and time. She has normal strength. She is not disoriented. She displays no  atrophy and no tremor. No cranial nerve deficit or sensory deficit. She exhibits normal muscle tone. She displays no seizure activity. Coordination and gait normal. GCS eye subscore is 4. GCS verbal subscore is 5. GCS motor subscore is 6.  Skin: Skin is warm, dry and intact. No abrasion, no bruising, no burn, no ecchymosis, no laceration, no lesion, no petechiae and no rash noted. She is not diaphoretic. No cyanosis or erythema. No pallor. Nails show no clubbing.  Psychiatric: She has a normal mood and affect. Her speech is normal and behavior is normal. Judgment and thought content normal. Cognition and memory are normal.  Nursing note and vitals reviewed.   ED Course  Procedures (including critical care time)  Labs Review Labs Reviewed  RAPID STREP SCREEN (NOT AT [facility])  CULTURE, GROUP A STREP [facility])    Imaging Review No results found.   1345 Discussed rapid strep negative and will call with throat culture results typically 48 hours.  No antibiotic start at this time  as rapid negative and other URI symptoms.  Discussed with patient virus and post nasal drip can cause sore throat/tonsillitis.  Patient verbalized understanding of information/instructions, agreed with plan of care and had no further questions at this time.  MDM   1. Acute tonsillitis, unspecified etiology   2. Acute rhinosinusitis   3. Otitis media with effusion, bilateral    Supportive treatment.   No evidence of invasive bacterial infection, non toxic and well hydrated.  This is most likely self limiting viral infection.  I do not see where any further testing or imaging is necessary at this time.   I will suggest supportive care, rest, good hygiene and encourage the patient to take adequate fluids.  The patient is to return to clinic or EMERGENCY ROOM if symptoms worsen or change significantly e.g. ear pain, fever, purulent discharge from ears or bleeding.  Exitcare handout on otitis media with effusion given to patient.   Patient verbalized agreement and understanding of treatment plan.    Patient notified rapid strep negative.  Suspect Viral illness: no evidence of invasive bacterial infection, non toxic and well hydrated.  This is most likely self limiting viral infection.  I do not see where any further testing or imaging is necessary at this time.   I will suggest supportive care, rest, good hygiene and encourage the patient to take adequate fluids.  refused work excuse.  Notified patient staff will call with culture results once available next 48+ hours.   flonase 1 spray each nostril BID prn, nasal saline 1-2 sprays each nostril prn q2h, motrin 800mg  po TID prn or tylenol 1000mg  po QID prn pain/fever.  Discussed honey with lemon and salt water gargles for comfort also.  The patient is to return to clinic or EMERGENCY ROOM if symptoms worsen or change significantly e.g. fever, lethargy, SOB, wheezing.  Exitcare handout on viral illness given to patient.  Patient verbalized agreement and understanding of treatment plan.    Start flonase 1 spray each nostril bid and saline nasal 2 sprays each nostril q2h prn congestion.  If no relief symptoms with 48 hours of flonase/saline use start augmentin 875mg  po BID x 10 days.  tylenol 1000mg  po QID prn pain.  No evidence of systemic bacterial infection, non toxic and well hydrated.  I do not see where any further testing or imaging is necessary at this time.   I will suggest supportive care, rest, good hygiene and encourage the patient to take adequate fluids.  The patient is to return to clinic or EMERGENCY ROOM if symptoms worsen or change significantly.  Exitcare handout on sinusitis given to patient.  Patient verbalized agreement and understanding of treatment plan and had no further questions at this time.   P2:  Hand washing and cover cough  Patient refused work note.  Usually no specific medical treatment is needed if a virus is causing the sore throat.  The throat most often  gets better on its own within 5 to 7 days.  Antibiotic medicine does not cure viral pharyngitis.  Discussed rapid strep negative.  Throat culture pending typically will call in 48 hours with results.  Prednisone 40mg  po with breakfast x 5 days, dukes mouthwash 42ml po TID gargle/swallow prn sore throat.  Tylenol 1000mg  po QID prn pain/fever. For acute pharyngitis caused by bacteria, your healthcare provider will prescribe an antibiotic.  Marland Kitchen Do not smoke.  Marland Kitchen Avoid secondhand smoke and other air pollutants.  . Use a cool mist humidifier to  add moisture to the air.  . Get plenty of rest.  . You may want to rest your throat by talking less and eating a diet that is mostly liquid or soft for a day or two.   Marland Kitchen Nonprescription throat lozenges and mouthwashes should help relieve the soreness.   . Gargling with warm saltwater and drinking warm liquids may help.  (You can make a saltwater solution by adding 1/4 teaspoon of salt to 8 ounces, or 240 mL, of warm water.)  . A nonprescription pain reliever such as aspirin, acetaminophen, or ibuprofen may ease general aches and pains.   FOLLOW UP with clinic provider if no improvements in the next 7-10 days. Exitcare handout on pharyngitis given to patient.   Patient verbalized understanding of instructions and agreed with plan of care and had no further questions at this time. P2:  Hand washing and diet.    Olen Cordial, NP 02/05/15 1359

## 2015-02-08 LAB — CULTURE, GROUP A STREP (THRC)

## 2015-02-09 ENCOUNTER — Telehealth: Payer: Self-pay | Admitting: Family Medicine

## 2015-02-09 NOTE — Telephone Encounter (Signed)
Telephone message left for patient throat culture normal/negative for strep.  If further questions or concerns to contact clinic between [phone] and ask to speak with a nurse.

## 2015-02-12 ENCOUNTER — Encounter: Payer: Self-pay | Admitting: *Deleted

## 2015-02-12 ENCOUNTER — Ambulatory Visit
Admission: EM | Admit: 2015-02-12 | Discharge: 2015-02-12 | Disposition: A | Discharge status: Home / Self Care | Payer: Federal, State, Local not specified - PPO | Attending: Family Medicine | Admitting: Family Medicine

## 2015-02-12 DIAGNOSIS — J329 Chronic sinusitis, unspecified: Secondary | ICD-10-CM | POA: Diagnosis not present

## 2015-02-12 DIAGNOSIS — H109 Unspecified conjunctivitis: Secondary | ICD-10-CM | POA: Diagnosis not present

## 2015-02-12 LAB — RAPID INFLUENZA A&B ANTIGENS: Influenza A (ARMC): NOT DETECTED

## 2015-02-12 LAB — RAPID INFLUENZA A&B ANTIGENS (ARMC ONLY): INFLUENZA B (ARMC): NOT DETECTED

## 2015-02-12 MED ORDER — POLYMYXIN B-TRIMETHOPRIM 10000-0.1 UNIT/ML-% OP SOLN: 1.0000 [drp] | Freq: Four times a day (QID) | OPHTHALMIC | Status: AC

## 2015-02-12 NOTE — ED Provider Notes (Signed)
CSN: [id number]     Arrival date & time 02/12/15  1139 History   First MD Initiated Contact with Patient 02/12/15 1235     Chief Complaint  Patient presents with  . URI   (Consider location/radiation/quality/duration/timing/severity/associated sxs/prior Treatment) HPI: Patient presents today with symptoms of nasal congestion, sinus pressure. Patient also has symptoms of left eyebrow denies. Patient states that she was seen here last week and was told that she had a viral illness. Her rapid strep test was negative. Patient states that she does not have a bad sore throat anymore. She denies any productive cough, chest pain, shortness of breath, severe headache, nausea, vomiting, diarrhea. She has been treating her symptoms with over-the-counter medications.  No past medical history on file. No past surgical history on file. No family history on file. Social History  Substance Use Topics  . Smoking status: Never Smoker   . Smokeless tobacco: Not on file  . Alcohol Use: No   OB History    No data available     Review of Systems: Negative except mentioned above.   Allergies  Review of patient's allergies indicates no known allergies.  Home Medications   Prior to Admission medications   Medication Sig Start Date End Date Taking? Authorizing Provider  acetaminophen (TYLENOL) 500 MG tablet Take 2 tablets (1,000 mg total) by mouth every 6 (six) hours as needed for mild pain, moderate pain, fever or headache. 02/05/15   Olen Cordial, NP  amoxicillin-clavulanate (AUGMENTIN) 875-125 MG tablet Take 1 tablet by mouth every 12 (twelve) hours. 02/08/15 02/17/15  Olen Cordial, NP  fluticasone (FLONASE) 50 MCG/ACT nasal spray Place 1 spray into both nostrils 2 (two) times daily. 02/05/15   Olen Cordial, NP  predniSONE (DELTASONE) 20 MG tablet Take 2 tablets (40 mg total) by mouth daily with breakfast. 02/05/15   Olen Cordial, NP  sodium chloride (OCEAN) 0.65 % SOLN nasal spray Place 2  sprays into both nostrils every 2 (two) hours while awake. 02/05/15   Olen Cordial, NP   Meds Ordered and Administered this Visit  Medications - No data to display  BP 133/61 mmHg  Pulse 88  Temp(Src) 97.8 F (36.6 C) (Oral)  Resp 18  Ht 5\' 8"  (1.727 m)  Wt 185 lb (83.915 kg)  BMI 28.14 kg/m2  SpO2 100%  LMP 01/15/2015 No data found.   Physical Exam:  GENERAL: NAD HEENT: mild pharyngeal erythema, no exudate, no erythema of TMs, no cervical LAD, mild erythema of left conjunctiva, no discharge appreciated, PERRL, EOMI, mild maxillary sinus tenderness bilaterally RESP: CTA B CARD: RRR NEURO: CN II-XII grossly intact   ED Course  Procedures (including critical care time)  Labs Review Labs Reviewed  RAPID INFLUENZA A&B ANTIGENS ([facility] ONLY)    Imaging Review No results found.   MDM  A/P: Sinusitis, Left Conjunctivitis- we'll treat patient with Augmentin that was arty prescribed to her at last visit, will also treat her with Polytrim ophthalmic for the conjunctivitis, encouraged use of Flonase for her nasal congestion, rest, hydration, Tylenol/Ibuprofen when necessary. If symptoms do persist or worsen I do recommend the patient follow up with her primary care physician.    Paulina Fusi, MD 02/12/15 1302

## 2015-02-12 NOTE — ED Notes (Signed)
Patient was seen last Sunday for sore throat. Per patient strep was negative and she was given amoxillicin which she did not take. Patient stated feeling worse.

## 2015-08-28 ENCOUNTER — Ambulatory Visit
Admission: EM | Admit: 2015-08-28 | Discharge: 2015-08-28 | Disposition: A | Discharge status: Home / Self Care | Payer: Federal, State, Local not specified - PPO | Attending: Family Medicine | Admitting: Family Medicine

## 2015-08-28 DIAGNOSIS — N938 Other specified abnormal uterine and vaginal bleeding: Secondary | ICD-10-CM

## 2015-08-28 LAB — PREGNANCY, URINE: PREG TEST UR: NEGATIVE

## 2015-08-28 NOTE — ED Provider Notes (Signed)
[facility] ____________________________________________  Time seen: Approximately 7:25 PM  I have reviewed the triage vital signs and the nursing notes.   HISTORY  Chief Complaint Vaginal Bleeding   HPI Donna Zuniga is a 40 y.o. female presenting for evaluation of vaginal bleeding. Patient reports she began having vaginal bleeding on August 4 which was consistent with start date of her normal menstrual cycle. Patient reports her menstrual was normally last 1 week. Reports she is continuing vaginal bleeding. Patient reports that the fluid vaginal bleeding varies but reports overall has been light with intermittent heavier days. Patient reports bleeding right now is described as moderate. Patient reports she is not using tampons but using pads and panty lingers. Patient reports she is not saturating pads and states that she could probably use 1 pad throughout the whole day. Patient reports history of similar in the past. Patient reports she had a very similar situation occurred last year.   Patient reports she was previously evaluated by her OB/GYN at that time was found to have a small fibroid in her uterus but declines other findings and was then started on medroxyprogesterone. Patient reports initially she was taking the  Medroxyprogesterone once a day but states that this did not stop her bleeding or OB/GYN and had increased to 3 times a day. Patient reports she has not followed up with her OB/GYN or primary care for this current episode due to her work schedule.  Patient declines pain. Reports occasional pelvic cramping sensation but no pain. Denies back pain, dysuria, vaginal discharge. Denies any weakness, dizziness, palpitations, chest pain, shortness of breath or other complaints. Denies fevers. Reports continues to eat and drink well. Reports has continued to remain active and work. Patient declines pregnancy. Declines concerns of STDs. Reports sexually active with 1  partner, her husband. Reports gravida 4, para 2, abortion 2. Declines any abdominal or pelvic surgeries.  Patient's last menstrual period was 08/11/2015. As above.  PCP: [facility]   History reviewed. No pertinent past medical history. Reports history of anemia, no history of blood transfusions, reports was briefly taken oral iron but states has not taken in several years as not directed to do so by PCP. There are no active problems to display for this patient.   History reviewed. No pertinent surgical history.   No current facility-administered medications for this encounter.   Current Outpatient Prescriptions:  .  acetaminophen (TYLENOL) 500 MG tablet, Take 2 tablets (1,000 mg total) by mouth every 6 (six) hours as needed for mild pain, moderate pain, fever or headache., Disp: 30 tablet, Rfl: 0 .  fluticasone (FLONASE) 50 MCG/ACT nasal spray, Place 1 spray into both nostrils 2 (two) times daily., Disp: 16 g, Rfl: 0 .  predniSONE (DELTASONE) 20 MG tablet, Take 2 tablets (40 mg total) by mouth daily with breakfast., Disp: 10 tablet, Rfl: 0 .  sodium chloride (OCEAN) 0.65 % SOLN nasal spray, Place 2 sprays into both nostrils every 2 (two) hours while awake., Disp: , Rfl: 0  Allergies Review of patient's allergies indicates no known allergies.  History reviewed. No pertinent family history.  Social History Social History  Substance Use Topics  . Smoking status: Current Smoker  . Smokeless tobacco: Never Used  . Alcohol use No    Review of Systems Constitutional: No fever/chills Eyes: No visual changes. ENT: No sore throat. Cardiovascular: Denies chest pain. Respiratory: Denies shortness of breath. Gastrointestinal: No abdominal pain.  No nausea, no vomiting.  No diarrhea.  No constipation. Genitourinary: Negative for dysuria. Musculoskeletal: Negative for back pain. Skin: Negative for rash. Neurological: Negative for headaches, focal weakness or  numbness.  10-point ROS otherwise negative.  ____________________________________________   PHYSICAL EXAM:  VITAL SIGNS: ED Triage Vitals  Enc Vitals Group     BP 08/28/15 1754 127/61     Pulse Rate 08/28/15 1754 78     Resp 08/28/15 1754 18     Temp 08/28/15 1754 98.4 F (36.9 C)     Temp Source 08/28/15 1754 Oral     SpO2 08/28/15 1754 100 %     Weight 08/28/15 1750 185 lb (83.9 kg)     Height 08/28/15 1750 5\' 8"  (1.727 m)     Head Circumference --      Peak Flow --      Pain Score 08/28/15 1753 1     Pain Loc --      Pain Edu? --      Excl. in [place]? --     Constitutional: Alert and oriented. Well appearing and in no acute distress. Eyes: Conjunctivae are normal. PERRL. EOMI. ENT      Head: Normocephalic and atraumatic.      Nose: No congestion/rhinnorhea.      Mouth/Throat: Mucous membranes are moist. Cardiovascular: Normal rate, regular rhythm. Grossly normal heart sounds.  Good peripheral circulation. Respiratory: Normal respiratory effort without tachypnea nor retractions. Breath sounds are clear and equal bilaterally. No wheezes/rales/rhonchi.. Gastrointestinal: Soft and nontender. No distention. Normal Bowel sounds. No CVA tenderness. Musculoskeletal:  Ambulatory with steady gait. No midline cervical, thoracic or lumbar tenderness to palpation.  Pelvic: Patient declines pelvic exam. Neurologic:  Normal speech and language. No gross focal neurologic deficits are appreciated. Speech is normal. No gait instability.  Skin:  Skin is warm, dry and intact. No rash noted. Psychiatric: Mood and affect are normal. Speech and behavior are normal. Patient exhibits appropriate insight and judgment   ___________________________________________   LABS (all labs ordered are listed, but only abnormal results are displayed)  Labs Reviewed  PREGNANCY, URINE    PROCEDURES Procedures    INITIAL IMPRESSION / [place] / ED COURSE  Pertinent labs & imaging results  that were available during my care of the patient were reviewed by me and considered in my medical decision making (see chart for details).  Very well-appearing patient. No acute distress. Presenting for evaluation of abnormal vaginal bleeding. Patient reports menstrual started at normal time but has continued with vaginal bleeding. Patient reports moderate vaginal bleeding at this time. Denies any other accompanying symptoms. Patient reports feeling well otherwise. Patient requests prescription for medroxyprogesterone as this has worked well for her in the past with her dysfunctional uterine bleeding during the one previous occurrence. Urine pregnancy negative.   Discussed in detail with patient regarding evaluation of uterine bleeding as well as evaluating CBC at this time evaluating hemodynamic stability. Discussed in detail with patient and recommended for patient to be seen and evaluated by OB/GYN for further monitoring and management of this. Offered to perform pelvic exam for further evaluation, patient declined. Discussed in detail and offered to evaluate CBC and serum hCG, patient declined any lab work and states that she does not want labs at this time. Discussed risks and benefits of medication of medroxyprogesterone including as patient is a smoker higher risk of elevated blood pressure, DVT, strokes and other side effects. Counseled as patient does not currently want to proceed with further evaluation of blood work at this  time, patient opts to follow up outpatient with her primary physician. Patient declines any further evaluation urgent care at this time. Encourage patient for any bleeding changes or pain to or worsening concerns proceed directly to the ER. Patient then recalls that she has a half-day next week and will plan to follow-up with OB/GYN then.  Discussed follow up with Primary care physician this week. Discussed follow up and return parameters including no resolution or any  worsening concerns. Patient verbalized understanding and agreed to plan.   ____________________________________________   FINAL CLINICAL IMPRESSION(S) / ED DIAGNOSES  Final diagnoses:  Dysfunctional uterine bleeding     Discharge Medication List as of 08/28/2015  7:03 PM      Note: This dictation was prepared with Dragon dictation along with smaller phrase technology. Any transcriptional errors that result from this process are unintentional.    Clinical Course      Marylene Land, NP 08/28/15 2116    Marylene Land, NP 08/28/15 2117

## 2015-08-28 NOTE — ED Triage Notes (Addendum)
Patient started her menstrual cycle Aug 11, 2015, and has continued to have bleeding ever since. She states the bleeding is dark at times and then becomes bright red and heavy again. It happened last year and her OB/GYN prescribed medroxyprogestrone and this stopped the bleeding then.

## 2015-11-08 DIAGNOSIS — E559 Vitamin D deficiency, unspecified: Secondary | ICD-10-CM

## 2015-11-08 HISTORY — DX: Vitamin D deficiency, unspecified: E55.9

## 2015-11-20 DIAGNOSIS — Z1151 Encounter for screening for human papillomavirus (HPV): Secondary | ICD-10-CM | POA: Diagnosis not present

## 2015-11-20 DIAGNOSIS — Z1239 Encounter for other screening for malignant neoplasm of breast: Secondary | ICD-10-CM | POA: Diagnosis not present

## 2015-11-20 DIAGNOSIS — Z315 Encounter for genetic counseling: Secondary | ICD-10-CM | POA: Diagnosis not present

## 2015-11-20 DIAGNOSIS — Z01419 Encounter for gynecological examination (general) (routine) without abnormal findings: Secondary | ICD-10-CM | POA: Diagnosis not present

## 2015-11-20 DIAGNOSIS — Z124 Encounter for screening for malignant neoplasm of cervix: Secondary | ICD-10-CM | POA: Diagnosis not present

## 2015-11-20 DIAGNOSIS — Z8041 Family history of malignant neoplasm of ovary: Secondary | ICD-10-CM | POA: Diagnosis not present

## 2016-01-10 ENCOUNTER — Ambulatory Visit (INDEPENDENT_AMBULATORY_CARE_PROVIDER_SITE_OTHER): Payer: Self-pay

## 2016-01-10 ENCOUNTER — Ambulatory Visit
Admission: EM | Admit: 2016-01-10 | Discharge: 2016-01-10 | Disposition: A | Discharge status: Home / Self Care | Payer: Self-pay | Attending: Family Medicine | Admitting: Family Medicine

## 2016-01-10 ENCOUNTER — Encounter: Payer: Self-pay | Admitting: Emergency Medicine

## 2016-01-10 DIAGNOSIS — S161XXA Strain of muscle, fascia and tendon at neck level, initial encounter: Secondary | ICD-10-CM

## 2016-01-10 DIAGNOSIS — S199XXA Unspecified injury of neck, initial encounter: Secondary | ICD-10-CM | POA: Diagnosis not present

## 2016-01-10 DIAGNOSIS — M542 Cervicalgia: Secondary | ICD-10-CM | POA: Diagnosis not present
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
## 2016-01-10 MED ORDER — IBUPROFEN 800 MG PO TABS
800.0000 mg | ORAL_TABLET | Freq: Once | ORAL | Status: AC
  Administered 2016-01-10: 800 mg via ORAL

## 2016-01-10 MED ORDER — CYCLOBENZAPRINE HCL 10 MG PO TABS: 10.0000 mg | ORAL_TABLET | Freq: Every day | ORAL | 0 refills | Status: DC

## 2016-01-10 MED ORDER — HYDROCODONE-ACETAMINOPHEN 5-325 MG PO TABS: 1.0000 | ORAL_TABLET | Freq: Four times a day (QID) | ORAL | 0 refills | Status: DC | PRN
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
## 2016-01-15 ENCOUNTER — Telehealth: Payer: Self-pay

## 2016-01-23 DIAGNOSIS — S161XXD Strain of muscle, fascia and tendon at neck level, subsequent encounter: Secondary | ICD-10-CM | POA: Diagnosis not present

## 2016-02-05 DIAGNOSIS — M545 Low back pain: Secondary | ICD-10-CM | POA: Diagnosis not present

## 2016-02-05 DIAGNOSIS — M542 Cervicalgia: Secondary | ICD-10-CM | POA: Diagnosis not present

## 2016-02-20 DIAGNOSIS — M545 Low back pain: Secondary | ICD-10-CM | POA: Diagnosis not present

## 2016-02-20 DIAGNOSIS — M542 Cervicalgia: Secondary | ICD-10-CM | POA: Diagnosis not present

## 2016-03-12 DIAGNOSIS — M545 Low back pain: Secondary | ICD-10-CM | POA: Diagnosis not present

## 2016-03-12 DIAGNOSIS — M542 Cervicalgia: Secondary | ICD-10-CM | POA: Diagnosis not present

## 2016-05-15 DIAGNOSIS — K080 Exfoliation of teeth due to systemic causes: Secondary | ICD-10-CM | POA: Diagnosis not present

## 2016-05-22 DIAGNOSIS — R202 Paresthesia of skin: Secondary | ICD-10-CM | POA: Diagnosis not present

## 2016-05-22 DIAGNOSIS — R0981 Nasal congestion: Secondary | ICD-10-CM | POA: Diagnosis not present

## 2016-05-22 DIAGNOSIS — Z720 Tobacco use: Secondary | ICD-10-CM | POA: Diagnosis not present

## 2016-06-07 DIAGNOSIS — N920 Excessive and frequent menstruation with regular cycle: Secondary | ICD-10-CM | POA: Diagnosis not present

## 2016-06-13 ENCOUNTER — Ambulatory Visit: Payer: Self-pay | Admitting: Obstetrics and Gynecology

## 2016-07-01 DIAGNOSIS — F4323 Adjustment disorder with mixed anxiety and depressed mood: Secondary | ICD-10-CM | POA: Diagnosis not present

## 2016-07-11 DIAGNOSIS — F4323 Adjustment disorder with mixed anxiety and depressed mood: Secondary | ICD-10-CM | POA: Diagnosis not present

## 2016-07-12 DIAGNOSIS — F4323 Adjustment disorder with mixed anxiety and depressed mood: Secondary | ICD-10-CM | POA: Insufficient documentation

## 2016-07-17 DIAGNOSIS — F4320 Adjustment disorder, unspecified: Secondary | ICD-10-CM | POA: Diagnosis not present

## 2016-08-05 ENCOUNTER — Telehealth: Payer: Self-pay

## 2016-08-05 DIAGNOSIS — Z30011 Encounter for initial prescription of contraceptive pills: Secondary | ICD-10-CM

## 2016-08-05 MED ORDER — NORETHIN-ETH ESTRAD-FE BIPHAS 1 MG-10 MCG / 10 MCG PO TABS: 1.0000 | ORAL_TABLET | Freq: Every day | ORAL | 0 refills | Status: DC

## 2016-08-05 NOTE — Telephone Encounter (Signed)
Pt is on Microgestrin FE 1/20.  She would prefer the Lo Loestrin that you discussed.  Please send to [facility] in [place].  Pt wants to be notified either way.

## 2016-08-05 NOTE — Telephone Encounter (Signed)
Pt has questions about her bleeding.  She started her menses 7/22.  Her daughter stopped taking her bcp, Loestrin, so pt started taking it on 7/22.  Why is she still bleeding? At her annual you discussed Lo Loestrin c her.  Pt has stopped smoking completely on 6/1.  Will you rx Lo Loestrin?  If so, when does she start it?  Finish this pk of Loestrin or start new pk of Lo Loestrin?  Wants to stop bleeding altogether.  Pt is off today if needs appt.  Adv pt it takes body a good 27m to adjust to bc.  [phone]

## 2016-08-05 NOTE — Telephone Encounter (Signed)
Rx Lo Loestrin eRxd. Annual due 11/18. RN to notify pt.

## 2016-08-05 NOTE — Telephone Encounter (Signed)
Which dose of Loestrin is she on? 1/20 or 1.5/30. If 1/20, pt can continue that Rx and I can send it in. If 1.5/30, I recommend changing her to lower dose. She would start next pill pack. Reassurance re: BTB on OCPs for 3 months. RN to discuss with pt.

## 2016-08-05 NOTE — Telephone Encounter (Signed)
Pt aware.

## 2016-08-22 DIAGNOSIS — F4323 Adjustment disorder with mixed anxiety and depressed mood: Secondary | ICD-10-CM | POA: Diagnosis not present

## 2016-09-12 DIAGNOSIS — L811 Chloasma: Secondary | ICD-10-CM | POA: Diagnosis not present

## 2016-09-12 DIAGNOSIS — L821 Other seborrheic keratosis: Secondary | ICD-10-CM | POA: Diagnosis not present

## 2016-09-13 NOTE — Telephone Encounter (Signed)
Pt returning call. She is off work now & would appreciate a call back. PE#[phone]

## 2016-09-16 ENCOUNTER — Ambulatory Visit (INDEPENDENT_AMBULATORY_CARE_PROVIDER_SITE_OTHER): Payer: Federal, State, Local not specified - PPO | Admitting: Obstetrics and Gynecology

## 2016-09-16 ENCOUNTER — Ambulatory Visit (INDEPENDENT_AMBULATORY_CARE_PROVIDER_SITE_OTHER): Payer: Federal, State, Local not specified - PPO

## 2016-09-16 ENCOUNTER — Other Ambulatory Visit: Payer: Self-pay | Admitting: Obstetrics and Gynecology

## 2016-09-16 ENCOUNTER — Ambulatory Visit: Payer: Self-pay | Admitting: Obstetrics and Gynecology

## 2016-09-16 ENCOUNTER — Encounter: Payer: Self-pay | Admitting: Obstetrics and Gynecology

## 2016-09-16 VITALS — BP 140/80 | HR 88 | Ht 66.5 in | Wt 199.0 lb

## 2016-09-16 DIAGNOSIS — R102 Pelvic and perineal pain unspecified side: Secondary | ICD-10-CM

## 2016-09-16 DIAGNOSIS — N939 Abnormal uterine and vaginal bleeding, unspecified: Secondary | ICD-10-CM

## 2016-09-16 DIAGNOSIS — R319 Hematuria, unspecified: Secondary | ICD-10-CM | POA: Diagnosis not present

## 2016-09-16 DIAGNOSIS — D259 Leiomyoma of uterus, unspecified: Secondary | ICD-10-CM | POA: Insufficient documentation

## 2016-09-16 DIAGNOSIS — N3001 Acute cystitis with hematuria: Secondary | ICD-10-CM | POA: Diagnosis not present

## 2016-09-16 DIAGNOSIS — D219 Benign neoplasm of connective and other soft tissue, unspecified: Secondary | ICD-10-CM | POA: Diagnosis not present

## 2016-09-16 LAB — POCT URINALYSIS DIPSTICK
BILIRUBIN UA: NEGATIVE
GLUCOSE UA: NEGATIVE
Ketones, UA: NEGATIVE
NITRITE UA: NEGATIVE
Spec Grav, UA: 1.025 (ref 1.010–1.025)
pH, UA: 6 (ref 5.0–8.0)

## 2016-09-16 MED ORDER — NITROFURANTOIN MONOHYD MACRO 100 MG PO CAPS: 100.0000 mg | ORAL_CAPSULE | Freq: Two times a day (BID) | ORAL | 0 refills | Status: AC

## 2016-09-16 NOTE — Telephone Encounter (Signed)
Pt is calling today to see if she a be worked in this afternoon due to severe abdmonial pain. Please advise

## 2016-09-16 NOTE — Telephone Encounter (Signed)
Per Elmo Putt schedule patient at 4pm and follow you at 4:30 today. Pt advise of appt

## 2016-09-16 NOTE — Telephone Encounter (Signed)
Pt to come in at 2:50 with 3:30 u/s.

## 2016-09-16 NOTE — Progress Notes (Signed)
Chief Complaint  Patient presents with  . Pelvic Pain    HPI:      [name] is a 41 y.o. No obstetric history on file. who LMP was Patient's last menstrual period was 08/21/2016., presents today for pelvic pain/pressure/ache for the past few days. Pt laid around all day yesterday. She took tylenol and ibup without relief. Sx are a little better today. Pain feels like menstrual cramping/discomfort.   She is on Lo Loestrin OCPs and just started last wk of pills yesterday. Menses are monthly, last 2-3 days, no BTB, mild cramping Qmenses, relieved with motrin. Sx are just worse this month.   She denies any urin sx, vag sx, GI sx. No unusual LBP, belly pain, fevers. She is sex active, no new partners. No unusual dyspareunia. Hx of UTI in the past.  She had a 3.6 cm leio on 9/14 u/s.  Annual due 11/18.     Past Medical History:  Diagnosis Date  . Cervical dysplasia   . Family history of ovarian cancer    doesn't meet [employer] genetic testing guidelines.  . Leiomyoma 2014  . Vitamin D deficiency 11/2015    Past Surgical History:  Procedure Laterality Date  . LEEP      Family History  Problem Relation Age of Onset  . Ovarian cancer Maternal Aunt 50    Social History   Social History  . Marital status: Married    Spouse name: N/A  . Number of children: N/A  . Years of education: N/A   Occupational History  . Not on file.   Social History Main Topics  . Smoking status: Never Smoker  . Smokeless tobacco: Never Used  . Alcohol use No  . Drug use: No  . Sexual activity: Not on file   Other Topics Concern  . Not on file   Social History Narrative  . No narrative on file     Current Outpatient Prescriptions:  .  cyclobenzaprine (FLEXERIL) 10 MG tablet, Take 1 tablet (10 mg total) by mouth at bedtime. prn (Patient not taking: Reported on 09/16/2016), Disp: 30 tablet, Rfl: 0 .  HYDROcodone-acetaminophen (NORCO/VICODIN) 5-325 MG tablet, Take 1-2 tablets by  mouth every 6 (six) hours as needed for severe pain. (Patient not taking: Reported on 09/16/2016), Disp: 6 tablet, Rfl: 0 .  nitrofurantoin, macrocrystal-monohydrate, (MACROBID) 100 MG capsule, Take 1 capsule (100 mg total) by mouth 2 (two) times daily., Disp: 10 capsule, Rfl: 0 .  Norethindrone-Ethinyl Estradiol-Fe Biphas (LO LOESTRIN FE) 1 MG-10 MCG / 10 MCG tablet, Take 1 tablet by mouth daily., Disp: 84 tablet, Rfl: 0   ROS:  Review of Systems  Constitutional: Negative for fever.  Gastrointestinal: Negative for blood in stool, constipation, diarrhea, nausea and vomiting.  Genitourinary: Positive for pelvic pain. Negative for dyspareunia, dysuria, flank pain, frequency, hematuria, urgency, vaginal bleeding, vaginal discharge and vaginal pain.  Musculoskeletal: Negative for back pain.  Skin: Negative for rash.     OBJECTIVE:   Vitals:  BP 140/80 (BP Location: Left Arm, Patient Position: Sitting, Cuff Size: Normal)   Pulse 88   Ht 5' 6.5" (1.689 m)   Wt 199 lb (90.3 kg)   LMP 08/21/2016   BMI 31.64 kg/m   Physical Exam  Constitutional: She is oriented to person, place, and time and well-developed, well-nourished, and in no distress. Vital signs are normal.  Abdominal: Soft. There is no tenderness. There is no rebound and no guarding.  Genitourinary: Vagina normal, cervix  normal, right adnexa normal, left adnexa normal and vulva normal. Uterus is enlarged. Cervix exhibits no motion tenderness and no tenderness. Right adnexum displays no mass and no tenderness. Left adnexum displays no mass and no tenderness. Vulva exhibits no erythema, no exudate, no lesion, no rash and no tenderness. Vagina exhibits no lesion.  Neurological: She is oriented to person, place, and time.  Vitals reviewed.   Results: GYN U/S-->EM=5.36 MM; NO FF IN CDS; 2 LEIO--5.6 X 6.5 CM AND 1.5 X 2.1 CM; BILAT OVAR WITH FOLLICLES  Assessment/Plan: Pelvic pain - For several days. Neg u/s except 2 leio, doubt  etiology of pain. Pos hematuria. Will treat for UTI with macrobid. Will call pt with results/sx f/u.   Acute cystitis with hematuria - Check C&S. Rx macrobid. - Plan: POCT Urinalysis Dipstick, Urine Culture, nitrofurantoin, macrocrystal-monohydrate, (MACROBID) 100 MG capsule  Leiomyoma - Slightly larger from 2014. Most likely not cause of sx. Will follow expectantly.   Hematuria, unspecified type - No blood on vag exam. Will rechk if C&S neg.     Meds ordered this encounter  Medications  . nitrofurantoin, macrocrystal-monohydrate, (MACROBID) 100 MG capsule    Sig: Take 1 capsule (100 mg total) by mouth 2 (two) times daily.    Dispense:  10 capsule    Refill:  0      Return if symptoms worsen or fail to improve.  Alicia B. Copland, PA-C 09/16/2016 5:09 PM

## 2016-09-16 NOTE — Telephone Encounter (Signed)
Called and spoke with Patient about being worked in today. Advise patient this is the work in appt per [place] . Pt is requesting to be seen after 3:30.  Pt has been put schedule at 2:50 and will call back . Prefers to have someone call her back.Please advise.

## 2016-09-19 LAB — URINE CULTURE

## 2016-09-23 ENCOUNTER — Telehealth: Payer: Self-pay

## 2016-09-23 NOTE — Telephone Encounter (Signed)
Pt calling for urine results.  Is still have pain, thinks it may be from her cycle.  Has questions.  [phone] Left detailed msg to call with her specific questions so we can route msg more effeciently.

## 2016-09-24 NOTE — Telephone Encounter (Signed)
Pt has finished the macrobid, is aware of urine results.  Pt also wanted to let [facility] know that she started the 3rd pack of lo loestrin fe Sunday, starts cramping really bad on the brown pills. Wanted to know if she needed to take something otc for the cramping like advil.  She was hoping the lo loestrin fe would help the cramping.  Adv may take ibuprophen 200mg  and inc dose as needed up to 800mg .  Also adv it take body a good 72m to adjust to new bc.  Pt wanted to be sure I let you know all this and she will keep you posted.

## 2016-09-25 NOTE — Telephone Encounter (Signed)
Ok. Will f/u with sx at 11/18 annual/sooner prn.

## 2016-10-03 DIAGNOSIS — F4323 Adjustment disorder with mixed anxiety and depressed mood: Secondary | ICD-10-CM | POA: Diagnosis not present

## 2016-10-08 DIAGNOSIS — F331 Major depressive disorder, recurrent, moderate: Secondary | ICD-10-CM | POA: Diagnosis not present

## 2016-10-29 DIAGNOSIS — F331 Major depressive disorder, recurrent, moderate: Secondary | ICD-10-CM | POA: Diagnosis not present

## 2016-11-11 ENCOUNTER — Telehealth: Payer: Self-pay

## 2016-11-11 DIAGNOSIS — Z30011 Encounter for initial prescription of contraceptive pills: Secondary | ICD-10-CM

## 2016-11-11 MED ORDER — NORETHIN-ETH ESTRAD-FE BIPHAS 1 MG-10 MCG / 10 MCG PO TABS
1.0000 | ORAL_TABLET | Freq: Every day | ORAL | 0 refills | Status: DC
Start: 2016-11-11 — End: 2016-11-11

## 2016-11-11 MED ORDER — NORETHIN-ETH ESTRAD-FE BIPHAS 1 MG-10 MCG / 10 MCG PO TABS: 1.0000 | ORAL_TABLET | Freq: Every day | ORAL | 0 refills | Status: DC

## 2016-11-11 NOTE — Telephone Encounter (Signed)
Pt is refusing to schedule annual. Pt will call back to be schedule

## 2016-11-11 NOTE — Telephone Encounter (Signed)
Pt calling for 90d supply of bcp be sent in as she is on her last week of pills.  [phone]

## 2016-12-03 DIAGNOSIS — F331 Major depressive disorder, recurrent, moderate: Secondary | ICD-10-CM | POA: Diagnosis not present

## 2016-12-10 ENCOUNTER — Encounter: Payer: Self-pay | Admitting: Obstetrics and Gynecology

## 2016-12-10 ENCOUNTER — Ambulatory Visit (INDEPENDENT_AMBULATORY_CARE_PROVIDER_SITE_OTHER): Payer: Federal, State, Local not specified - PPO | Admitting: Obstetrics and Gynecology

## 2016-12-10 VITALS — BP 140/94 | HR 72 | Ht 66.0 in | Wt 208.0 lb

## 2016-12-10 DIAGNOSIS — R351 Nocturia: Secondary | ICD-10-CM | POA: Diagnosis not present

## 2016-12-10 DIAGNOSIS — Z1231 Encounter for screening mammogram for malignant neoplasm of breast: Secondary | ICD-10-CM

## 2016-12-10 DIAGNOSIS — F4323 Adjustment disorder with mixed anxiety and depressed mood: Secondary | ICD-10-CM | POA: Diagnosis not present

## 2016-12-10 DIAGNOSIS — R311 Benign essential microscopic hematuria: Secondary | ICD-10-CM | POA: Diagnosis not present

## 2016-12-10 DIAGNOSIS — E049 Nontoxic goiter, unspecified: Secondary | ICD-10-CM

## 2016-12-10 DIAGNOSIS — Z3041 Encounter for surveillance of contraceptive pills: Secondary | ICD-10-CM | POA: Diagnosis not present

## 2016-12-10 DIAGNOSIS — Z124 Encounter for screening for malignant neoplasm of cervix: Secondary | ICD-10-CM | POA: Diagnosis not present

## 2016-12-10 DIAGNOSIS — Z01419 Encounter for gynecological examination (general) (routine) without abnormal findings: Secondary | ICD-10-CM | POA: Diagnosis not present

## 2016-12-10 DIAGNOSIS — G4700 Insomnia, unspecified: Secondary | ICD-10-CM | POA: Diagnosis not present

## 2016-12-10 DIAGNOSIS — Z1239 Encounter for other screening for malignant neoplasm of breast: Secondary | ICD-10-CM

## 2016-12-10 DIAGNOSIS — Z6832 Body mass index (BMI) 32.0-32.9, adult: Secondary | ICD-10-CM | POA: Diagnosis not present

## 2016-12-10 DIAGNOSIS — Z0000 Encounter for general adult medical examination without abnormal findings: Secondary | ICD-10-CM | POA: Diagnosis not present

## 2016-12-10 DIAGNOSIS — Z8041 Family history of malignant neoplasm of ovary: Secondary | ICD-10-CM | POA: Diagnosis not present

## 2016-12-10 LAB — POCT URINALYSIS DIPSTICK
Bilirubin, UA: NEGATIVE
Glucose, UA: NEGATIVE
KETONES UA: NEGATIVE
NITRITE UA: NEGATIVE
PH UA: 6 (ref 5.0–8.0)
Spec Grav, UA: 1.015 (ref 1.010–1.025)

## 2016-12-10 MED ORDER — NORETHIN-ETH ESTRAD-FE BIPHAS 1 MG-10 MCG / 10 MCG PO TABS: 1.0000 | ORAL_TABLET | Freq: Every day | ORAL | 3 refills | Status: DC

## 2016-12-10 NOTE — Patient Instructions (Signed)
I value your feedback and entrusting us with your care. If you get a [facility] patient survey, I would appreciate you taking the time to let us know about your experience today. Thank you! 

## 2016-12-10 NOTE — Progress Notes (Signed)
PCP:  Ricardo Jericho, NP   Chief Complaint  Patient presents with  . Gynecologic Exam    urinating during the night;  poss menopausal - gets hot at hs     HPI:      Donna Zuniga is a 41 y.o. No obstetric history on file. who LMP was Patient's last menstrual period was 11/15/2016., presents today for her annual examination.  Her menses are regular every 28-30 days, lasting 2-3 days.  Dysmenorrhea mild, occurring first 1-2 days of flow. She does not have intermenstrual bleeding.  Sex activity: single partner, contraception - OCP (estrogen/progesterone).  Last Pap: 11/20/15  Results were: no abnormalities /neg HPV DNA . Likes yearly paps. Hx of STDs: HPV  Last mammogram: none recent. There is no FH of breast cancer. There is a FH of ovarian cancer in her mat aunt, genetic testing hasn't been done because pt doesn't meet [employer] guidelines. The patient does do self-breast exams.  Tobacco use: The patient denies current or previous tobacco use. Alcohol use: none No drug use.  Exercise: moderately active  She does get adequate calcium and Vitamin D in her diet.  Pt notes urinary frequency and nocturia. She drinks caffeine all day.   She also has a hx of enlarged thyroid, followed by PCP.  She has had wt gain with depression after loss of father unexpectedly this yr. Her PCP has given her exercise regimen to start and will f/u with her in 1 mo.   Her BP is elevated today but pt was upset with phone call before coming to office. It was 120/70 with PCP this morning.   Past Medical History:  Diagnosis Date  . Anxiety   . Cervical dysplasia   . Depression   . Family history of ovarian cancer    doesn't meet BC[employer] Fed genetic testing guidelines.  . Leiomyoma 2014  . Vitamin D deficiency 11/2015    Past Surgical History:  Procedure Laterality Date  . LEEP      Family History  Problem Relation Age of Onset  . Ovarian cancer Maternal Aunt 64  .  Hypertension Father     Social History   Socioeconomic History  . Marital status: Married    Spouse name: Not on file  . Number of children: Not on file  . Years of education: Not on file  . Highest education level: Not on file  Social Needs  . Financial resource strain: Not on file  . Food insecurity - worry: Not on file  . Food insecurity - inability: Not on file  . Transportation needs - medical: Not on file  . Transportation needs - non-medical: Not on file  Occupational History  . Not on file  Tobacco Use  . Smoking status: Never Smoker  . Smokeless tobacco: Never Used  Substance and Sexual Activity  . Alcohol use: No  . Drug use: No  . Sexual activity: Yes    Birth control/protection: Pill  Other Topics Concern  . Not on file  Social History Narrative  . Not on file    Current Meds  Medication Sig  . acetaminophen (TYLENOL) 500 MG tablet Take 1 tablet by mouth as needed.  . Cholecalciferol (VITAMIN D3) 2000 units capsule Take 1 capsule by mouth daily.  . Norethindrone-Ethinyl Estradiol-Fe Biphas (LO LOESTRIN FE) 1 MG-10 MCG / 10 MCG tablet Take 1 tablet by mouth daily.  . sertraline (ZOLOFT) 100 MG tablet Take 1 tablet by mouth daily.  Marland Kitchen  traZODone (DESYREL) 50 MG tablet Take 1 tablet by mouth daily.  . [DISCONTINUED] Norethindrone-Ethinyl Estradiol-Fe Biphas (LO LOESTRIN FE) 1 MG-10 MCG / 10 MCG tablet Take 1 tablet daily by mouth.     ROS:  Review of Systems  Constitutional: Negative for fatigue, fever and unexpected weight change.  Respiratory: Negative for cough, shortness of breath and wheezing.   Cardiovascular: Negative for chest pain, palpitations and leg swelling.  Gastrointestinal: Negative for blood in stool, constipation, diarrhea, nausea and vomiting.  Endocrine: Negative for cold intolerance, heat intolerance and polyuria.  Genitourinary: Positive for frequency. Negative for dyspareunia, dysuria, flank pain, genital sores, hematuria, menstrual  problem, pelvic pain, urgency, vaginal bleeding, vaginal discharge and vaginal pain.  Musculoskeletal: Negative for back pain, joint swelling and myalgias.  Skin: Negative for rash.  Neurological: Negative for dizziness, syncope, light-headedness, numbness and headaches.  Hematological: Negative for adenopathy.  Psychiatric/Behavioral: Positive for dysphoric mood. Negative for agitation, confusion, sleep disturbance and suicidal ideas. The patient is not nervous/anxious.      Objective: BP (!) 140/94   Pulse 72   Ht 5\' 6"  (1.676 m)   Wt 208 lb (94.3 kg)   LMP 11/15/2016   BMI 33.57 kg/m    Physical Exam  Constitutional: She is oriented to person, place, and time. She appears well-developed and well-nourished.  Genitourinary: Vagina normal and uterus normal. There is no rash or tenderness on the right labia. There is no rash or tenderness on the left labia. No erythema or tenderness in the vagina. No vaginal discharge found. Right adnexum does not display mass and does not display tenderness. Left adnexum does not display mass and does not display tenderness. Cervix does not exhibit motion tenderness or polyp. Uterus is not enlarged or tender.  Neck: Normal range of motion. Thyromegaly present.  Cardiovascular: Normal rate, regular rhythm and normal heart sounds.  No murmur heard. Pulmonary/Chest: Effort normal and breath sounds normal. Right breast exhibits no mass, no nipple discharge, no skin change and no tenderness. Left breast exhibits no mass, no nipple discharge, no skin change and no tenderness.  Abdominal: Soft. There is no tenderness. There is no guarding.  Musculoskeletal: Normal range of motion.  Neurological: She is alert and oriented to person, place, and time. No cranial nerve deficit.  Psychiatric: She has a normal mood and affect. Her behavior is normal.  Vitals reviewed.   Results: Results for orders placed or performed in visit on 12/10/16 (from the past 24  hour(s))  POCT Urinalysis Dipstick     Status: Abnormal   Collection Time: 12/10/16  5:17 PM  Result Value Ref Range   Color, UA yellow    Clarity, UA clear    Glucose, UA neg    Bilirubin, UA neg    Ketones, UA neg    Spec Grav, UA 1.015 1.010 - 1.025   Blood, UA small    pH, UA 6.0 5.0 - 8.0   Protein, UA trace    Urobilinogen, UA  0.2 or 1.0 E.U./dL   Nitrite, UA neg    Leukocytes, UA Small (1+) (A) Negative    Assessment/Plan: Encounter for annual routine gynecological examination  Cervical cancer screening - Pt prefers yearly paps. - Plan: Pap IG (Image Guided)  Encounter for surveillance of contraceptive pills - OCP RF.  - Plan: Norethindrone-Ethinyl Estradiol-Fe Biphas (LO LOESTRIN FE) 1 MG-10 MCG / 10 MCG tablet  Screening for breast cancer - Pt to sched mammo. - Plan: MM DIGITAL SCREENING BILATERAL  Benign essential microscopic hematuria - Check C&S. If neg, will rechk urine in a couple wks.  - Plan: Urine Culture, POCT Urinalysis Dipstick  Nocturia - D/C caffeine after 12 noon. Check C&S.   Enlarged thyroid - Followed by PCP.  Family history of ovarian cancer - Pt doesn't meet [employer] Fed guidelines for genetic testing. Will cont to follow FH.  Meds ordered this encounter  Medications  . Norethindrone-Ethinyl Estradiol-Fe Biphas (LO LOESTRIN FE) 1 MG-10 MCG / 10 MCG tablet    Sig: Take 1 tablet by mouth daily.    Dispense:  84 tablet    Refill:  3    Please text Lo Loestrin Fe to [id number] to activate coupon card.  Pt needs to schedule annual exam.             GYN counsel breast self exam, mammography screening, adequate intake of calcium and vitamin D, diet and exercise     F/U  Return in about 1 year (around 12/10/2017).  Tanny Harnack B. Tou Hayner, PA-C 12/10/2016 5:22 PM

## 2016-12-11 LAB — PAP IG (IMAGE GUIDED): PAP SMEAR COMMENT: 0

## 2016-12-12 LAB — URINE CULTURE

## 2016-12-24 DIAGNOSIS — F331 Major depressive disorder, recurrent, moderate: Secondary | ICD-10-CM | POA: Diagnosis not present

## 2017-01-09 ENCOUNTER — Inpatient Hospital Stay: Admission: RE | Admit: 2017-01-09 | Payer: Federal, State, Local not specified - PPO | Source: Ambulatory Visit

## 2017-01-15 DIAGNOSIS — F331 Major depressive disorder, recurrent, moderate: Secondary | ICD-10-CM | POA: Diagnosis not present

## 2017-01-20 ENCOUNTER — Inpatient Hospital Stay: Admission: RE | Admit: 2017-01-20 | Payer: Federal, State, Local not specified - PPO | Source: Ambulatory Visit

## 2017-01-29 ENCOUNTER — Telehealth: Payer: Self-pay

## 2017-01-29 NOTE — Telephone Encounter (Signed)
Pt is scheduled for her 1st mammogram on 02/03/17 & inquiring what the procedure is after the mammogram is done on her receiving results. [phone].

## 2017-01-29 NOTE — Telephone Encounter (Signed)
Spoke w/pt. Notified per [facility] that [facility] will notify patient and [facility] will send letter thru my chart unless there is a reason to contact patient with other results.

## 2017-02-03 ENCOUNTER — Inpatient Hospital Stay: Admission: RE | Admit: 2017-02-03 | Payer: Federal, State, Local not specified - PPO | Source: Ambulatory Visit

## 2017-02-06 DIAGNOSIS — E669 Obesity, unspecified: Secondary | ICD-10-CM | POA: Diagnosis not present

## 2017-02-06 DIAGNOSIS — R600 Localized edema: Secondary | ICD-10-CM | POA: Diagnosis not present

## 2017-02-25 ENCOUNTER — Inpatient Hospital Stay: Admission: RE | Admit: 2017-02-25 | Payer: Federal, State, Local not specified - PPO | Source: Ambulatory Visit

## 2017-03-17 ENCOUNTER — Inpatient Hospital Stay: Admission: RE | Admit: 2017-03-17 | Payer: Federal, State, Local not specified - PPO | Source: Ambulatory Visit

## 2017-03-31 DIAGNOSIS — E669 Obesity, unspecified: Secondary | ICD-10-CM | POA: Diagnosis not present

## 2017-04-15 DIAGNOSIS — F331 Major depressive disorder, recurrent, moderate: Secondary | ICD-10-CM | POA: Diagnosis not present

## 2017-04-21 ENCOUNTER — Telehealth: Payer: Self-pay

## 2017-04-21 NOTE — Telephone Encounter (Signed)
Pt has question about her birth control. [phone]

## 2017-04-21 NOTE — Telephone Encounter (Signed)
Pt on Lo Lo and forgot to take pill on Saturday night. She had intercourse Sunday morning. She took 2 pills Sunday night. Questioning if she needs Plan B. Advised that is her decision. She should use back up protection for the remainder of the month and if she is late for her menses take a pregnancy test.

## 2017-04-28 ENCOUNTER — Telehealth: Payer: Self-pay | Admitting: Obstetrics and Gynecology

## 2017-04-28 NOTE — Telephone Encounter (Signed)
Bleeding due to missed pill. She can double up pills till placebo pills, take placebo pills normally, and then start new pill pack correctly. May help stop bleeding but there is no magic Rx for this.

## 2017-04-28 NOTE — Telephone Encounter (Signed)
Patient needs to speak to Alicia's CMA.  Patient stays it is very important that she speaks to someone today.

## 2017-04-28 NOTE — Telephone Encounter (Signed)
Pt eants ABC to send her something in to stop her period. She missed a pill then doubled up the next day now shes on her period and its been on for a week and very painful and heavy. Pt aware this could be due to the missing and doubling up on the pill. She states this have never been this painful please send in something to make it stop,

## 2017-04-28 NOTE — Telephone Encounter (Signed)
Pt states can you Rx provera, it had helped in the past. Pt aware of what you had advised her to do with the BC pills, she states she does not have placebo pills , she's not understanding.

## 2017-04-29 NOTE — Telephone Encounter (Signed)
Rx provera can't be added to her OCPs (progesterone already in OCPs).  She can either continue pills as she has been doing and bleeding should improve, or try my suggestion. The last 4 pills are "placebo pills". Thx.

## 2017-04-29 NOTE — Telephone Encounter (Signed)
Spoke with pt. Bleeding is minimal in AM and then restarts in afternoon. Has cramping, too. Has today's active pill left and then placebo pills. Finish pill pack normally and then see if sx resolve with start of new pill pack. Had u/s 10/18 with 2 large leio but normal otherwise. F/u prn.

## 2017-04-29 NOTE — Telephone Encounter (Signed)
Can you call pt, she wants to talk to you, she doesn't want to have her period for 2 weeks. She doesn't understand why , this has never happened before when she missed a pill. Ive explained it to her and she needs to talk to you , she says

## 2017-05-21 DIAGNOSIS — E669 Obesity, unspecified: Secondary | ICD-10-CM | POA: Diagnosis not present

## 2017-05-21 DIAGNOSIS — K5900 Constipation, unspecified: Secondary | ICD-10-CM | POA: Diagnosis not present

## 2017-05-28 DIAGNOSIS — E663 Overweight: Secondary | ICD-10-CM | POA: Diagnosis not present

## 2017-06-04 DIAGNOSIS — E663 Overweight: Secondary | ICD-10-CM | POA: Insufficient documentation

## 2017-06-11 DIAGNOSIS — F331 Major depressive disorder, recurrent, moderate: Secondary | ICD-10-CM | POA: Diagnosis not present

## 2017-07-04 DIAGNOSIS — R51 Headache: Secondary | ICD-10-CM | POA: Diagnosis not present

## 2017-07-04 DIAGNOSIS — E663 Overweight: Secondary | ICD-10-CM | POA: Diagnosis not present

## 2017-07-05 DIAGNOSIS — F331 Major depressive disorder, recurrent, moderate: Secondary | ICD-10-CM | POA: Diagnosis not present

## 2017-07-15 ENCOUNTER — Telehealth: Payer: Self-pay | Admitting: Obstetrics and Gynecology

## 2017-07-15 NOTE — Telephone Encounter (Signed)
RN to find out more and can advise pt.

## 2017-07-15 NOTE — Telephone Encounter (Signed)
Patient is working in a different time zone.  She wants to make sure she takes her medicine at the same time in our time zone or the time zone she is in.

## 2017-07-15 NOTE — Telephone Encounter (Signed)
Pt just wanted to make sure she should keep taking her pill the same time, pt aware she is correct, what ever the time difference is she would still take it OUR time

## 2017-07-15 NOTE — Telephone Encounter (Signed)
Please advise 

## 2017-08-13 DIAGNOSIS — F331 Major depressive disorder, recurrent, moderate: Secondary | ICD-10-CM | POA: Diagnosis not present

## 2017-09-10 DIAGNOSIS — F331 Major depressive disorder, recurrent, moderate: Secondary | ICD-10-CM | POA: Diagnosis not present

## 2017-09-15 DIAGNOSIS — E663 Overweight: Secondary | ICD-10-CM | POA: Diagnosis not present

## 2017-09-15 DIAGNOSIS — M25512 Pain in left shoulder: Secondary | ICD-10-CM | POA: Diagnosis not present

## 2017-10-09 ENCOUNTER — Telehealth: Payer: Self-pay

## 2017-10-09 NOTE — Telephone Encounter (Signed)
Pt has some questions/concerns about her menstrual period. [phone]

## 2017-10-09 NOTE — Telephone Encounter (Signed)
Pt is going to [place] & was wanting to do continued dosing on her birth control to try to avoid her period as her active pills run out the day before she leaves. Pt is on Lo-Lo Estrin. Verified with JS that patient should start a new pack of pills on Wed of her last week of current pack when she would normally take the white pill. Pt aware.

## 2017-10-21 ENCOUNTER — Other Ambulatory Visit: Payer: Self-pay

## 2017-10-21 ENCOUNTER — Encounter: Payer: Self-pay | Admitting: Emergency Medicine

## 2017-10-21 ENCOUNTER — Ambulatory Visit
Admission: EM | Admit: 2017-10-21 | Discharge: 2017-10-21 | Disposition: A | Discharge status: Home / Self Care | Payer: Federal, State, Local not specified - PPO | Attending: Family Medicine | Admitting: Family Medicine

## 2017-10-21 DIAGNOSIS — R197 Diarrhea, unspecified: Secondary | ICD-10-CM

## 2017-10-21 DIAGNOSIS — R112 Nausea with vomiting, unspecified: Secondary | ICD-10-CM

## 2017-10-21 DIAGNOSIS — A084 Viral intestinal infection, unspecified: Secondary | ICD-10-CM | POA: Diagnosis not present

## 2017-10-21 DIAGNOSIS — R109 Unspecified abdominal pain: Secondary | ICD-10-CM

## 2017-10-21 LAB — CBC WITH DIFFERENTIAL/PLATELET
Abs Immature Granulocytes: 0.01 10*3/uL (ref 0.00–0.07)
BASOS PCT: 0 %
Basophils Absolute: 0 10*3/uL (ref 0.0–0.1)
EOS PCT: 1 %
Eosinophils Absolute: 0 10*3/uL (ref 0.0–0.5)
HCT: 37.1 % (ref 36.0–46.0)
Hemoglobin: 12.5 g/dL (ref 12.0–15.0)
Immature Granulocytes: 0 %
Lymphocytes Relative: 17 %
Lymphs Abs: 0.9 10*3/uL (ref 0.7–4.0)
MCH: 28.7 pg (ref 26.0–34.0)
MCHC: 33.7 g/dL (ref 30.0–36.0)
MCV: 85.1 fL (ref 80.0–100.0)
MONO ABS: 0.4 10*3/uL (ref 0.1–1.0)
MONOS PCT: 7 %
Neutro Abs: 3.8 10*3/uL (ref 1.7–7.7)
Neutrophils Relative %: 75 %
PLATELETS: 236 10*3/uL (ref 150–400)
RBC: 4.36 MIL/uL (ref 3.87–5.11)
RDW: 14.4 % (ref 11.5–15.5)
WBC: 5.1 10*3/uL (ref 4.0–10.5)
nRBC: 0 % (ref 0.0–0.2)

## 2017-10-21 LAB — COMPREHENSIVE METABOLIC PANEL
ALT: 13 U/L (ref 0–44)
ANION GAP: 8 (ref 5–15)
AST: 13 U/L — ABNORMAL LOW (ref 15–41)
Albumin: 3.3 g/dL — ABNORMAL LOW (ref 3.5–5.0)
Alkaline Phosphatase: 47 U/L (ref 38–126)
BUN: 12 mg/dL (ref 6–20)
CALCIUM: 8.5 mg/dL — AB (ref 8.9–10.3)
CHLORIDE: 108 mmol/L (ref 98–111)
CO2: 25 mmol/L (ref 22–32)
CREATININE: 0.91 mg/dL (ref 0.44–1.00)
Glucose, Bld: 92 mg/dL (ref 70–99)
Potassium: 4 mmol/L (ref 3.5–5.1)
SODIUM: 141 mmol/L (ref 135–145)
Total Bilirubin: 0.6 mg/dL (ref 0.3–1.2)
Total Protein: 7.6 g/dL (ref 6.5–8.1)

## 2017-10-21 MED ORDER — ONDANSETRON HCL 4 MG/2ML IJ SOLN
4.0000 mg | Freq: Once | INTRAMUSCULAR | Status: AC
  Administered 2017-10-21: 4 mg via INTRAVENOUS

## 2017-10-21 MED ORDER — KETOROLAC TROMETHAMINE 30 MG/ML IJ SOLN
30.0000 mg | Freq: Once | INTRAMUSCULAR | Status: AC
  Administered 2017-10-21: 30 mg via INTRAVENOUS

## 2017-10-21 MED ORDER — ONDANSETRON 4 MG PO TBDP: 4.0000 mg | ORAL_TABLET | Freq: Three times a day (TID) | ORAL | 0 refills | Status: DC | PRN

## 2017-10-21 MED ORDER — SODIUM CHLORIDE 0.9 % IV BOLUS
1000.0000 mL | Freq: Once | INTRAVENOUS | Status: AC
  Administered 2017-10-21: 1000 mL via INTRAVENOUS

## 2017-10-21 NOTE — ED Provider Notes (Signed)
[facility]    CSN: [id number] Arrival date & time: 10/21/17  1323  History   Chief Complaint Chief Complaint  Patient presents with  . Abdominal Pain   HPI 42 year old female presents with abdominal pain, diarrhea, nausea, and vomiting.  Patient was recently in [place].  She states that her symptoms started on Sunday.  She developed sharp periumbilical abdominal pain.  Associated watery diarrhea.  She has had nausea and a few episodes of emesis.  Her symptoms have continued to be present.  She just got back in [place] yesterday.  Patient feels weak.  Feels dehydrated.  Has had chills but no documented fever.  She has had possible sick contacts where she was staying.  She states that her pain is severe.  No relieving factors.  No other associated symptoms.  No other claims.  PMH, Surgical Hx, Family Hx, Social History reviewed and updated as below.  Past Medical History:  Diagnosis Date  . Anxiety   . Cervical dysplasia   . Depression   . Family history of ovarian cancer    doesn't meet [employer] Fed genetic testing guidelines.  . Leiomyoma 2014  . Vitamin D deficiency 11/2015   Patient Active Problem List   Diagnosis Date Noted  . Enlarged thyroid 12/10/2016  . Nocturia 12/10/2016  . Pelvic pain 09/16/2016  . Leiomyoma 09/16/2016   Past Surgical History:  Procedure Laterality Date  . LEEP     OB History    Gravida  4   Para  2   Term  2   Preterm      AB  2   Living        SAB      TAB      Ectopic      Multiple      Live Births             Home Medications    Prior to Admission medications   Medication Sig Start Date End Date Taking? Authorizing Provider  acetaminophen (TYLENOL) 500 MG tablet Take 1 tablet by mouth as needed. 02/05/15  Yes [provider]  Cholecalciferol (VITAMIN D3) 2000 units capsule Take 1 capsule by mouth daily.   Yes [provider]  Norethindrone-Ethinyl Estradiol-Fe Biphas (LO LOESTRIN  FE) 1 MG-10 MCG / 10 MCG tablet Take 1 tablet by mouth daily. 31/5/40  Yes Copland, Elmo Putt B, PA-C  ondansetron (ZOFRAN-ODT) 4 MG disintegrating tablet Take 1 tablet (4 mg total) by mouth every 8 (eight) hours as needed for nausea or vomiting. 10/21/17   Coral Spikes, DO   Family History Family History  Problem Relation Age of Onset  . Ovarian cancer Maternal Aunt 3  . Hypertension Father    Social History Social History   Tobacco Use  . Smoking status: Former Research scientist (life sciences)  . Smokeless tobacco: Never Used  Substance Use Topics  . Alcohol use: No  . Drug use: No    Allergies   Patient has no known allergies.   Review of Systems Review of Systems  Constitutional: Positive for chills.  Gastrointestinal: Positive for abdominal pain, diarrhea, nausea and vomiting.   Physical Exam Triage Vital Signs ED Triage Vitals  Enc Vitals Group     BP 10/21/17 1351 135/86     Pulse Rate 10/21/17 1351 96     Resp 10/21/17 1351 16     Temp 10/21/17 1351 98.4 F (36.9 C)     Temp Source 10/21/17 1351 Oral  SpO2 10/21/17 1351 100 %     Weight 10/21/17 1347 167 lb (75.8 kg)     Height 10/21/17 1347 5\' 6"  (1.676 m)     Head Circumference --      Peak Flow --      Pain Score 10/21/17 1344 10     Pain Loc --      Pain Edu? --      Excl. in [place]? --    Updated Vital Signs BP 135/86 (BP Location: Left Arm)   Pulse 96   Temp 98.4 F (36.9 C) (Oral)   Resp 16   Ht 5\' 6"  (1.676 m)   Wt 75.8 kg   LMP  (Within Months)   SpO2 100%   BMI 26.95 kg/m   Visual Acuity Right Eye Distance:   Left Eye Distance:   Bilateral Distance:    Right Eye Near:   Left Eye Near:    Bilateral Near:     Physical Exam  Constitutional: She is oriented to person, place, and time. She appears well-developed. No distress.  HENT:  Mouth/Throat: Oropharynx is clear and moist.  Cardiovascular: Regular rhythm.  Mild tachycardia.  Pulmonary/Chest: Effort normal and breath sounds normal.  Abdominal: Soft.  Bowel sounds are normal. She exhibits no distension. There is no tenderness.  Neurological: She is alert and oriented to person, place, and time.  Psychiatric: She has a normal mood and affect. Her behavior is normal.  Nursing note and vitals reviewed.  UC Treatments / Results  Labs (all labs ordered are listed, but only abnormal results are displayed) Labs Reviewed  COMPREHENSIVE METABOLIC PANEL - Abnormal; Notable for the following components:      Result Value   Calcium 8.5 (*)    Albumin 3.3 (*)    AST 13 (*)    All other components within normal limits  CBC WITH DIFFERENTIAL/PLATELET    EKG None  Radiology No results found.  Procedures Procedures (including critical care time)  Medications Ordered in UC Medications  sodium chloride 0.9 % bolus 1,000 mL (1,000 mLs Intravenous New Bag/Given 10/21/17 1447)  ondansetron (ZOFRAN) injection 4 mg (4 mg Intravenous Given 10/21/17 1447)  ketorolac (TORADOL) 30 MG/ML injection 30 mg (30 mg Intravenous Given 10/21/17 1525)    Initial Impression / Assessment and Plan / UC Course  I have reviewed the triage vital signs and the nursing notes.  Pertinent labs & imaging results that were available during my care of the patient were reviewed by me and considered in my medical decision making (see chart for details).    42 year old female presents with nausea, vomiting, diarrhea, with a predominance of diarrhea.  Recent travel out of the country.  Suspect viral gastroenteritis versus foodborne illness.  Her husband is also affected.  This favors a viral etiology.  Patient well-appearing here.  Patient endorsing severe pain.  IV fluids given as well as Zofran.  No emesis here.  Toradol given for pain.  Patient stable at discharge.  Work note given.  Zofran as needed.  Supportive care.  Final Clinical Impressions(s) / UC Diagnoses   Final diagnoses:  Viral gastroenteritis     Discharge Instructions     Rest, lots of fluids.  Use  the zofran as needed for nausea. Push fluids.  Take care  Dr. Lacinda Axon     ED Prescriptions    Medication Sig Dispense Auth. Provider   ondansetron (ZOFRAN-ODT) 4 MG disintegrating tablet Take 1 tablet (4 mg total) by mouth every  8 (eight) hours as needed for nausea or vomiting. 20 tablet Coral Spikes, DO     Controlled Substance Prescriptions [place] Controlled Substance Registry consulted? Not Applicable   Coral Spikes, DO 10/21/17 1542

## 2017-10-21 NOTE — Discharge Instructions (Signed)
Rest, lots of fluids.  Use the zofran as needed for nausea. Push fluids.  Take care  Dr. Lacinda Axon

## 2017-10-21 NOTE — ED Triage Notes (Signed)
Pt c/o sharp pain in the center of her abdomen. She has had diarrhea that is watery. She has vomited once or twice. Started 2 days ago. She has been to [place] and got back yesterday.

## 2017-11-03 ENCOUNTER — Telehealth: Payer: Self-pay

## 2017-11-03 DIAGNOSIS — R51 Headache: Secondary | ICD-10-CM | POA: Diagnosis not present

## 2017-11-03 NOTE — Telephone Encounter (Signed)
Pt calling triage today because she is bleeding now for about 9 to 10 days and passing big clots wants to know if it is related to continuing her pills. Pt is also having a bad headache sense Friday. Pt is going to primary at 1pm and wanting to get a call back before 1 to see if she can ask the primary care to give her something to stop the bleeding she is having. Please advise. Thank you!

## 2017-11-03 NOTE — Telephone Encounter (Signed)
Spoke with pt. The bleeding can be caused by doing the continuous dosing of your pills. Just stop the active pills (has 2 more left),  and start the 4 placebo pills. You may still bleed, but then start a new pill pack. Adding additional hormones to stop the current bleeding will only make attaining cycle control harder. Pt seeing PCP for headache, which could be hormone triggered. F/u prn.

## 2017-11-18 DIAGNOSIS — Z23 Encounter for immunization: Secondary | ICD-10-CM | POA: Diagnosis not present

## 2017-11-18 DIAGNOSIS — F331 Major depressive disorder, recurrent, moderate: Secondary | ICD-10-CM | POA: Diagnosis not present

## 2017-12-16 DIAGNOSIS — Z1231 Encounter for screening mammogram for malignant neoplasm of breast: Secondary | ICD-10-CM | POA: Diagnosis not present

## 2017-12-16 DIAGNOSIS — E663 Overweight: Secondary | ICD-10-CM | POA: Diagnosis not present

## 2017-12-16 DIAGNOSIS — F4321 Adjustment disorder with depressed mood: Secondary | ICD-10-CM | POA: Diagnosis not present

## 2017-12-16 DIAGNOSIS — Z0000 Encounter for general adult medical examination without abnormal findings: Secondary | ICD-10-CM | POA: Diagnosis not present

## 2017-12-16 DIAGNOSIS — R002 Palpitations: Secondary | ICD-10-CM | POA: Diagnosis not present

## 2017-12-16 NOTE — Progress Notes (Signed)
PCP:  Ricardo Jericho, NP   Chief Complaint  Patient presents with  . Gynecologic Exam     HPI:      [name] is a 42 y.o. No obstetric history on file. who LMP was Patient's last menstrual period was 12/02/2017 (approximate)., presents today for her annual examination.  Her menses are regular every 28-30 days, lasting 2-3 days, light flow with OCPs. Occas doesn't have any bleeding on placebo pills.  Dysmenorrhea mild, occurring first 1-2 days of flow. She does not have intermenstrual bleeding.  Sex activity: single partner, contraception - OCP (estrogen/progesterone).  Last Pap: 12/10/16  Results were: no abnormalities /neg HPV DNA 2017. Likes yearly paps. 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 Plans to do at [facility] with PCP. There is no FH of breast cancer. There is a FH of ovarian cancer in her mat aunt, genetic testing hasn't been done because pt doesn't meet [employer] guidelines. The patient does do self-breast exams.  Tobacco use: The patient denies current or previous tobacco use. Alcohol use: none No drug use.  Exercise: moderately active  She does get adequate calcium and Vitamin D in her diet.  She has a hx of enlarged thyroid, followed by PCP. Other labs with PCP, too.  Past Medical History:  Diagnosis Date  . Anxiety   . Cervical dysplasia   . Depression   . Family history of ovarian cancer    doesn't meet BCBS Fed genetic testing guidelines.  . Leiomyoma 2014  . Vitamin D deficiency 11/2015    Past Surgical History:  Procedure Laterality Date  . COLPOSCOPY    . LEEP      Family History  Problem Relation Age of Onset  . Ovarian cancer Maternal Aunt 56  . Hypertension Father   . Other Mother        fibroids/leiomyoma of uterus    Social History   Socioeconomic History  . Marital status: Married    Spouse name: Not on file  . Number of children: Not on file  . Years of education: Not on file    . Highest education level: Not on file  Occupational History  . Not on file  Social Needs  . Financial resource strain: Not on file  . Food insecurity:    Worry: Not on file    Inability: Not on file  . Transportation needs:    Medical: Not on file    Non-medical: Not on file  Tobacco Use  . Smoking status: Former Research scientist (life sciences)  . Smokeless tobacco: Never Used  Substance and Sexual Activity  . Alcohol use: Yes    Comment: occ  . Drug use: No  . Sexual activity: Yes    Birth control/protection: Pill  Lifestyle  . Physical activity:    Days per week: Not on file    Minutes per session: Not on file  . Stress: Not on file  Relationships  . Social connections:    Talks on phone: Not on file    Gets together: Not on file    Attends religious service: Not on file    Active member of club or organization: Not on file    Attends meetings of clubs or organizations: Not on file    Relationship status: Not on file  . Intimate partner violence:    Fear of current or ex partner: Not on file    Emotionally abused: Not on file  Physically abused: Not on file    Forced sexual activity: Not on file  Other Topics Concern  . Not on file  Social History Narrative  . Not on file    Current Meds  Medication Sig  . Cholecalciferol (VITAMIN D3) 2000 units capsule Take 1 capsule by mouth daily.  . Norethindrone-Ethinyl Estradiol-Fe Biphas (LO LOESTRIN FE) 1 MG-10 MCG / 10 MCG tablet Take 1 tablet by mouth daily.     ROS:  Review of Systems  Constitutional: Negative for fatigue, fever and unexpected weight change.  Respiratory: Negative for cough, shortness of breath and wheezing.   Cardiovascular: Negative for chest pain, palpitations and leg swelling.  Gastrointestinal: Negative for blood in stool, constipation, diarrhea, nausea and vomiting.  Endocrine: Negative for cold intolerance, heat intolerance and polyuria.  Genitourinary: Negative for dyspareunia, dysuria, flank pain,  frequency, genital sores, hematuria, menstrual problem, pelvic pain, urgency, vaginal bleeding, vaginal discharge and vaginal pain.  Musculoskeletal: Negative for back pain, joint swelling and myalgias.  Skin: Negative for rash.  Neurological: Negative for dizziness, syncope, light-headedness, numbness and headaches.  Hematological: Negative for adenopathy.  Psychiatric/Behavioral: Negative for agitation, confusion, dysphoric mood, sleep disturbance and suicidal ideas. The patient is not nervous/anxious.      Objective: BP 136/80   Pulse 94   Ht 5\' 7"  (1.702 m)   Wt 184 lb (83.5 kg)   LMP 12/02/2017 (Approximate)   BMI 28.82 kg/m    Physical Exam  Constitutional: She is oriented to person, place, and time. She appears well-developed and well-nourished.  Genitourinary: Vagina normal and uterus normal. There is no rash or tenderness on the right labia. There is no rash or tenderness on the left labia. No erythema or tenderness in the vagina. No vaginal discharge found. Right adnexum does not display mass and does not display tenderness. Left adnexum does not display mass and does not display tenderness. Cervix does not exhibit motion tenderness or polyp. Uterus is not enlarged or tender.  Neck: Normal range of motion. No thyromegaly present.  Cardiovascular: Normal rate, regular rhythm and normal heart sounds.  No murmur heard. Pulmonary/Chest: Effort normal and breath sounds normal. Right breast exhibits no mass, no nipple discharge, no skin change and no tenderness. Left breast exhibits no mass, no nipple discharge, no skin change and no tenderness.  Abdominal: Soft. There is no tenderness. There is no guarding.  Musculoskeletal: Normal range of motion.  Neurological: She is alert and oriented to person, place, and time. No cranial nerve deficit.  Psychiatric: She has a normal mood and affect. Her behavior is normal.  Vitals reviewed.   Assessment/Plan: Encounter for annual routine  gynecological examination  Cervical cancer screening - Pt likes yearly paps. - Plan: Cytology - PAP  Screening for breast cancer - Pt plans to sched at [facility]. - Plan: MM 3D SCREEN BREAST BILATERAL  Family history of ovarian cancer - Doesn't meet BCBS Fed testing guidelines for cancer genetic testing but will cont to monitor yearly.       GYN counsel breast self exam, mammography screening, adequate intake of calcium and vitamin D, diet and exercise     F/U  Return in about 1 year (around 12/18/2018).  Dale Strausser B. Brynn Reznik, PA-C 12/17/2017 2:48 PM

## 2017-12-16 NOTE — Patient Instructions (Addendum)
I value your feedback and entrusting us with your care. If you get a [place] patient survey, I would appreciate you taking the time to let us know about your experience today. Thank you!  [facility]: [phone]  [facility]: [phone]  

## 2017-12-17 ENCOUNTER — Encounter: Payer: Self-pay | Admitting: Obstetrics and Gynecology

## 2017-12-17 ENCOUNTER — Other Ambulatory Visit (HOSPITAL_COMMUNITY)
Admission: RE | Admit: 2017-12-17 | Discharge: 2017-12-17 | Disposition: A | Discharge status: Home / Self Care | Payer: Federal, State, Local not specified - PPO | Source: Ambulatory Visit | Attending: Obstetrics and Gynecology | Admitting: Obstetrics and Gynecology

## 2017-12-17 ENCOUNTER — Ambulatory Visit (INDEPENDENT_AMBULATORY_CARE_PROVIDER_SITE_OTHER): Payer: Federal, State, Local not specified - PPO | Admitting: Obstetrics and Gynecology

## 2017-12-17 VITALS — BP 136/80 | HR 94 | Ht 67.0 in | Wt 184.0 lb

## 2017-12-17 DIAGNOSIS — Z124 Encounter for screening for malignant neoplasm of cervix: Secondary | ICD-10-CM | POA: Insufficient documentation

## 2017-12-17 DIAGNOSIS — Z1239 Encounter for other screening for malignant neoplasm of breast: Secondary | ICD-10-CM

## 2017-12-17 DIAGNOSIS — Z8041 Family history of malignant neoplasm of ovary: Secondary | ICD-10-CM | POA: Insufficient documentation

## 2017-12-17 DIAGNOSIS — Z01419 Encounter for gynecological examination (general) (routine) without abnormal findings: Secondary | ICD-10-CM | POA: Diagnosis not present

## 2017-12-18 LAB — CYTOLOGY - PAP: Diagnosis: NEGATIVE

## 2018-01-03 ENCOUNTER — Other Ambulatory Visit: Payer: Self-pay | Admitting: Obstetrics and Gynecology

## 2018-01-03 DIAGNOSIS — Z3041 Encounter for surveillance of contraceptive pills: Secondary | ICD-10-CM

## 2018-01-04 ENCOUNTER — Other Ambulatory Visit: Payer: Self-pay | Admitting: Maternal Newborn

## 2018-01-04 DIAGNOSIS — Z3041 Encounter for surveillance of contraceptive pills: Secondary | ICD-10-CM

## 2018-01-04 MED ORDER — NORETHIN-ETH ESTRAD-FE BIPHAS 1 MG-10 MCG / 10 MCG PO TABS: 1.0000 | ORAL_TABLET | Freq: Every day | ORAL | 3 refills | Status: DC

## 2018-01-04 NOTE — Progress Notes (Signed)
Rx for Lo Loestrin, patient was unable to get refill.

## 2018-03-10 ENCOUNTER — Encounter: Payer: Self-pay | Admitting: Obstetrics and Gynecology

## 2018-03-11 ENCOUNTER — Other Ambulatory Visit: Payer: Self-pay | Admitting: Obstetrics and Gynecology

## 2018-03-11 DIAGNOSIS — Z713 Dietary counseling and surveillance: Secondary | ICD-10-CM

## 2018-03-11 MED ORDER — PHENTERMINE HCL 37.5 MG PO TABS: 37.5000 mg | ORAL_TABLET | Freq: Every day | ORAL | 1 refills | Status: DC

## 2018-03-11 NOTE — Progress Notes (Signed)
Rx phentermine for 2 months. Pt has done Rx in past. Gained 20# back due to depression sx with loss of father. Seeing therapist. Monitoring to see if she needs depression Rx too.

## 2018-03-11 NOTE — Telephone Encounter (Signed)
Pt left msg on triage line as well.  [phone]

## 2018-04-09 ENCOUNTER — Encounter: Payer: Self-pay | Admitting: Obstetrics and Gynecology

## 2018-04-13 ENCOUNTER — Encounter: Payer: Self-pay | Admitting: Obstetrics and Gynecology

## 2018-04-13 ENCOUNTER — Ambulatory Visit (INDEPENDENT_AMBULATORY_CARE_PROVIDER_SITE_OTHER): Payer: Federal, State, Local not specified - PPO | Admitting: Obstetrics and Gynecology

## 2018-04-13 ENCOUNTER — Other Ambulatory Visit: Payer: Self-pay

## 2018-04-13 DIAGNOSIS — F329 Major depressive disorder, single episode, unspecified: Secondary | ICD-10-CM

## 2018-04-13 DIAGNOSIS — R635 Abnormal weight gain: Secondary | ICD-10-CM

## 2018-04-13 DIAGNOSIS — F32A Depression, unspecified: Secondary | ICD-10-CM

## 2018-04-13 DIAGNOSIS — F419 Anxiety disorder, unspecified: Secondary | ICD-10-CM

## 2018-04-13 NOTE — Patient Instructions (Signed)
I value your feedback and entrusting us with your care. If you get a [place] patient survey, I would appreciate you taking the time to let us know about your experience today. Thank you! 

## 2018-04-13 NOTE — Progress Notes (Signed)
Virtual Visit via Telephone Note  I connected with Donna Zuniga on 04/14/18 at 10:20 by telephone and verified that I am speaking with the correct person using two identifiers.    Chief Complaint  Patient presents with  . Depression  . Weight Gain    HPI:      [name] is a 43 y.o. [phone] who LMP was No LMP recorded., has complaints of weight gain for a couple months. She first reached out via MyChart 3/20 stating she has was eating more due to depression sx with loss of father and had a 20# wt gain. She was seeing therapist at that time. Pt had lost weight previously and had used phentermine in the past with sx improvement of appetite and increased energy. She wanted to try this again and was started on phentermine  3/20. Pt states it didn't help with her appetite or give her energy this time and she wondered if there was another medication that could do this. She is not able to do telephone visits with therapist currently and has depression sx. She also just lost 2 relatives in the last week, 1 to Covid, and she can't go to the funerals. She can't exercise except walk around her neighborhood so she just ordered a treadmill. She usually likes to shop/go out for stress mgmt, but can't do that now either with covid, so she feels like everything is "closing in on her". Because of this, she feels overwhelmed, has lots of worry and little energy, and is eating to cope. No SI. Her PCP prescribed her wellbutrin 1/20 but pt hasn't started it because she is afraid of anti-depressant meds. She is not interested in SSRIs.  She has a hx of heart palpitations and was cleared by her cardiologist at [facility] to take phentermine. He states her sx are related to stress.    Past Medical History:  Diagnosis Date  . Anxiety   . Cervical dysplasia   . Depression   . Family history of ovarian cancer    doesn't meet BCBS Fed genetic testing guidelines.  Marland Kitchen Heart palpitations    Dr. Posey Pronto at  [facility]  . Leiomyoma 2014  . Vitamin D deficiency 11/2015    Past Surgical History:  Procedure Laterality Date  . COLPOSCOPY    . LEEP      Family History  Problem Relation Age of Onset  . Ovarian cancer Maternal Aunt 56  . Hypertension Father   . Other Mother        fibroids/leiomyoma of uterus     Current Outpatient Medications:  .  Cholecalciferol (VITAMIN D3) 2000 units capsule, Take 1 capsule by mouth daily., Disp: , Rfl:  .  Norethindrone-Ethinyl Estradiol-Fe Biphas (LO LOESTRIN FE) 1 MG-10 MCG / 10 MCG tablet, Take 1 tablet by mouth daily., Disp: 84 tablet, Rfl: 3 .  phentermine (ADIPEX-P) 37.5 MG tablet, Take 1 tablet (37.5 mg total) by mouth daily before breakfast., Disp: 30 tablet, Rfl: 1 .  potassium chloride SA (K-DUR,KLOR-CON) 20 MEQ tablet, Take by mouth., Disp: , Rfl:    OBJECTIVE:    Physical Exam Vitals signs reviewed.  Constitutional:      Appearance: She is well-developed.  Neurological:     Mental Status: She is alert and oriented to person, place, and time.  Psychiatric:        Mood and Affect: Mood normal.        Behavior: Behavior normal.  Thought Content: Thought content normal.        Judgment: Judgment normal.    GAD 7 : Generalized Anxiety Score 04/13/2018  Nervous, Anxious, on Edge 3  Control/stop worrying 3  Worry too much - different things 3  Trouble relaxing 3  Restless 0  Easily annoyed or irritable 3  Afraid - awful might happen 3  Total GAD 7 Score 18   Depression screen PHQ 2/9 04/13/2018  Decreased Interest 3  Down, Depressed, Hopeless 3  PHQ - 2 Score 6  Altered sleeping 3  Tired, decreased energy 3  Change in appetite 3  Feeling bad or failure about yourself  1  Trouble concentrating 2  Moving slowly or fidgety/restless 2  Suicidal thoughts 0  PHQ-9 Score 20      Assessment/Plan: Anxiety and depression  Weight gain  Due to loss of relatives/father, eating to cope with wt gain. Encouraged pt to start  wellbutrin since it's in contrave anyway. Doesn't want SSRI. Discussed that phentermine won't reverse depression sx and sequela and she is better off trying to treat depression first. Hopefully with increased motivation and decreased depression sx, she won't be as hungry and will be able to control her wt. Pt agreed to try Rx and see if sx improve. Has Rx from PCP. F/u prn.    I discussed the assessment and treatment plan with the patient. The patient was provided an opportunity to ask questions and all were answered. The patient agreed with the plan and demonstrated an understanding of the instructions.   The patient was advised to call back or seek an in-person evaluation if the symptoms worsen or if the condition fails to improve as anticipated.  I provided 18 minutes of non-face-to-face time during this encounter.   Alicia B. Copland, PA-C 04/14/2018 8:20 AM

## 2018-08-26 ENCOUNTER — Ambulatory Visit: Payer: Federal, State, Local not specified - PPO | Admitting: Obstetrics and Gynecology

## 2018-08-31 ENCOUNTER — Ambulatory Visit: Payer: Federal, State, Local not specified - PPO | Admitting: Obstetrics and Gynecology

## 2018-09-07 ENCOUNTER — Telehealth: Payer: Self-pay

## 2018-09-07 DIAGNOSIS — Z1329 Encounter for screening for other suspected endocrine disorder: Secondary | ICD-10-CM

## 2018-09-07 DIAGNOSIS — N921 Excessive and frequent menstruation with irregular cycle: Secondary | ICD-10-CM

## 2018-09-07 NOTE — Telephone Encounter (Signed)
Patient states she has been bleeding for 3 weeks now. The same thing happened last month also. She has an apt to see ABC on Wed. She is requesting ABC to call her and inquiring if she can give her something to stop the bleeding and she is cramping pretty bad. [id number]

## 2018-09-07 NOTE — Telephone Encounter (Signed)
Is pt still taking OCPs?

## 2018-09-08 NOTE — Telephone Encounter (Signed)
Pt says yes, she is still taking OCP's, hasn't stopped them or missed a pill. Says the appt for tomorrow still needs to happen. Asked her what sx she was having that she needed to be seen, says all this happened about a week ago, is not really experiencing any of them now but still needs to see ABC. Is it possible to get something for the bad cramping? Would like to know what type of labs you are ordering?

## 2018-09-08 NOTE — Telephone Encounter (Signed)
Spoke with Donna Zuniga. Still on Lo lo. Menses were monthly on placebo pills, light for a few days. Has had BTB past 2 cycles, mod to light flow. Donna Zuniga tired of bleeding. Going on vacation next wk and doesn't want to bleed. No late/missed pills. Donna Zuniga also with cramping--hasn't taken any meds. Can try ibup. Check labs before 4:30 appt tomorrow. If labs normal, will change OCPs. If sx still persist, will check GYN u/s. Annual due 12/20.

## 2018-09-08 NOTE — Telephone Encounter (Signed)
Patient is calling to speak with Elmo Putt Copland to follow up with message left on nurse line. Patient is schedule for 09/10/18 at 4:30. Please advise

## 2018-09-09 ENCOUNTER — Other Ambulatory Visit: Payer: Self-pay | Admitting: Obstetrics and Gynecology

## 2018-09-09 ENCOUNTER — Encounter: Payer: Self-pay | Admitting: Obstetrics and Gynecology

## 2018-09-09 ENCOUNTER — Other Ambulatory Visit: Payer: Self-pay

## 2018-09-09 ENCOUNTER — Ambulatory Visit (INDEPENDENT_AMBULATORY_CARE_PROVIDER_SITE_OTHER): Payer: Federal, State, Local not specified - PPO | Admitting: Obstetrics and Gynecology

## 2018-09-09 VITALS — BP 118/90 | Ht 67.0 in | Wt 201.0 lb

## 2018-09-09 DIAGNOSIS — Z1329 Encounter for screening for other suspected endocrine disorder: Secondary | ICD-10-CM | POA: Diagnosis not present

## 2018-09-09 DIAGNOSIS — Z713 Dietary counseling and surveillance: Secondary | ICD-10-CM

## 2018-09-09 DIAGNOSIS — F329 Major depressive disorder, single episode, unspecified: Secondary | ICD-10-CM | POA: Diagnosis not present

## 2018-09-09 DIAGNOSIS — F32A Depression, unspecified: Secondary | ICD-10-CM

## 2018-09-09 DIAGNOSIS — N921 Excessive and frequent menstruation with irregular cycle: Secondary | ICD-10-CM | POA: Diagnosis not present

## 2018-09-09 DIAGNOSIS — F419 Anxiety disorder, unspecified: Secondary | ICD-10-CM

## 2018-09-09 NOTE — Patient Instructions (Signed)
I value your feedback and entrusting us with your care. If you get a [place] patient survey, I would appreciate you taking the time to let us know about your experience today. Thank you! 

## 2018-09-09 NOTE — Progress Notes (Signed)
Donna Jericho, NP   Chief Complaint  Patient presents with  . Advice Only    discuss weight gain,     HPI:      [name] is a 43 y.o. [phone] who LMP was Patient's last menstrual period was 08/26/2018 (approximate)., presents today for BTB f/u. Discussed with pt on phone yesterday but still wanted appt today. Pt on OCPs with monthly menses usually, but skipped 2-3 months of periods this spring. Then restarted bleeding 7/20 and 8/20 but bleeding lasting 3 wks. Light to mod flow, with cramping. Neg UPT. No missed/late pills. Checking labs today. Will call with results. If neg, will change OCPs and f/u at 12/20 annual. If abn, will treat and cycles should then return to normal. NSAIDs prn cramps.   Pt also complains of wt gain (17# since 12/19 appt). Pt eats for comfort, under increased anxiety/stress due to loss of 4 family members since 1/20. Exercising regularly, diet changes without wt loss. Was started on wellbutrin for depression/anxiety sx. 150 mg dose worked well initially, but then stopped working. Went up to 300 mg with PCP but caused horrible headaches. Was prescribed contrave but too expensive. Seeing therapist. Has tried Phentermine in past with wt loss but hx of heart palpitations in past. Cleared by cardio and safe to take per PCP, but pt hesitant. Has 1 mo RF from 3/20. Did wt watchers in past, seen nutritionist. Scared of wt gain with SSRIs.  Past Medical History:  Diagnosis Date  . Anxiety   . Cervical dysplasia   . Depression   . Family history of ovarian cancer    doesn't meet BCBS Fed genetic testing guidelines.  Marland Kitchen Heart palpitations    Dr. Posey Pronto at [facility]  . Leiomyoma 2014  . Vitamin D deficiency 11/2015    Past Surgical History:  Procedure Laterality Date  . COLPOSCOPY    . LEEP      Family History  Problem Relation Age of Onset  . Ovarian cancer Maternal Aunt 2  . Hypertension Father   . Other Mother        fibroids/leiomyoma of  uterus    Social History   Socioeconomic History  . Marital status: Married    Spouse name: Not on file  . Number of children: Not on file  . Years of education: Not on file  . Highest education level: Not on file  Occupational History  . Not on file  Social Needs  . Financial resource strain: Not on file  . Food insecurity    Worry: Not on file    Inability: Not on file  . Transportation needs    Medical: Not on file    Non-medical: Not on file  Tobacco Use  . Smoking status: Former Research scientist (life sciences)  . Smokeless tobacco: Never Used  Substance and Sexual Activity  . Alcohol use: Yes    Comment: occ  . Drug use: No  . Sexual activity: Yes    Birth control/protection: Pill  Lifestyle  . Physical activity    Days per week: Not on file    Minutes per session: Not on file  . Stress: Not on file  Relationships  . Social Herbalist on phone: Not on file    Gets together: Not on file    Attends religious service: Not on file    Active member of club or organization: Not on file    Attends meetings of clubs or organizations: Not  on file    Relationship status: Not on file  . Intimate partner violence    Fear of current or ex partner: Not on file    Emotionally abused: Not on file    Physically abused: Not on file    Forced sexual activity: Not on file  Other Topics Concern  . Not on file  Social History Narrative  . Not on file    Outpatient Medications Prior to Visit  Medication Sig Dispense Refill  . Cholecalciferol (VITAMIN D3) 2000 units capsule Take 1 capsule by mouth daily.    . hydrOXYzine (ATARAX/VISTARIL) 25 MG tablet     . Norethindrone-Ethinyl Estradiol-Fe Biphas (LO LOESTRIN FE) 1 MG-10 MCG / 10 MCG tablet Take 1 tablet by mouth daily. 84 tablet 3  . phentermine (ADIPEX-P) 37.5 MG tablet Take 1 tablet (37.5 mg total) by mouth daily before breakfast. 30 tablet 1  . potassium chloride SA (K-DUR,KLOR-CON) 20 MEQ tablet Take by mouth.     No  facility-administered medications prior to visit.       ROS:  Review of Systems  Constitutional: Negative for fever.  Gastrointestinal: Negative for blood in stool, constipation, diarrhea, nausea and vomiting.  Genitourinary: Positive for menstrual problem. Negative for dyspareunia, dysuria, flank pain, frequency, hematuria, urgency, vaginal bleeding, vaginal discharge and vaginal pain.  Musculoskeletal: Negative for back pain.  Skin: Negative for rash.  Psychiatric/Behavioral: Positive for agitation and dysphoric mood.    OBJECTIVE:   Vitals:  BP 118/90   Ht 5\' 7"  (1.702 m)   Wt 201 lb (91.2 kg)   LMP 08/26/2018 (Approximate)   BMI 31.48 kg/m   Physical Exam Vitals signs reviewed.  Constitutional:      Appearance: She is well-developed.  Neck:     Musculoskeletal: Normal range of motion.  Pulmonary:     Effort: Pulmonary effort is normal.  Musculoskeletal: Normal range of motion.  Skin:    General: Skin is warm and dry.  Neurological:     General: No focal deficit present.     Mental Status: She is alert and oriented to person, place, and time.     Cranial Nerves: No cranial nerve deficit.  Psychiatric:        Mood and Affect: Mood normal.        Behavior: Behavior normal.        Thought Content: Thought content normal.        Judgment: Judgment normal.      Assessment/Plan: Breakthrough bleeding on OCPs - Plan: TSH + free T4, Prolactin; check labs. If neg, change OCPs. Will call with results.  Thyroid disorder screening - Plan: TSH + free T4  Anxiety and depression--seeing PCP and therapist. May see psych.   Wt loss counseling--restart wt watchers, can add phentermine 1/2 dose for 1 mo to help. Try My[employer]itness Pal app.     Return if symptoms worsen or fail to [id number] annual   B. , PA-C 09/09/2018 4:45 PM

## 2018-09-10 ENCOUNTER — Ambulatory Visit: Payer: Federal, State, Local not specified - PPO | Admitting: Obstetrics and Gynecology

## 2018-09-10 ENCOUNTER — Telehealth: Payer: Self-pay | Admitting: Obstetrics and Gynecology

## 2018-09-10 DIAGNOSIS — R899 Unspecified abnormal finding in specimens from other organs, systems and tissues: Secondary | ICD-10-CM

## 2018-09-10 LAB — TSH+FREE T4
Free T4: 1.75 ng/dL (ref 0.82–1.77)
TSH: 0.275 u[IU]/mL — ABNORMAL LOW (ref 0.450–4.500)

## 2018-09-10 LAB — PROLACTIN: Prolactin: 21.2 ng/mL (ref 4.8–23.3)

## 2018-09-10 MED ORDER — LEVONORGESTREL-ETHINYL ESTRAD 0.1-20 MG-MCG PO TABS: 1.0000 | ORAL_TABLET | Freq: Every day | ORAL | 1 refills | Status: DC

## 2018-09-10 NOTE — Telephone Encounter (Signed)
LM with results re: thyroid labs. TSH low but free T4 normal. Rechk in 1 mo. Order placed.  Will go ahead and change OCPs due to BTB for 2 cycles on Lo Lo. Start aviane pills this wknd. Rx eRxd. F/u at 12/20 annual/sooner prn. If sx persist after BC change, will check GYN u/s.

## 2018-09-10 NOTE — Telephone Encounter (Signed)
Discussed pt's labs. Questions answered. Had TSH=0.53, free T4=0.90 on 3/20 labs with cardio.

## 2018-09-10 NOTE — Telephone Encounter (Signed)
Patient would like a calling back please. Thank you!

## 2018-10-26 ENCOUNTER — Other Ambulatory Visit: Payer: Federal, State, Local not specified - PPO

## 2018-10-26 ENCOUNTER — Encounter: Payer: Self-pay | Admitting: Obstetrics and Gynecology

## 2018-10-27 LAB — TSH+FREE T4
Free T4: 1.48 ng/dL (ref 0.82–1.77)
TSH: 0.498 u[IU]/mL (ref 0.450–4.500)

## 2018-10-27 NOTE — Telephone Encounter (Signed)
Pt aware of normal rechk thyroid levels. Was having BTB with Lo Loestrin, so changed to Aviane 9/20. On 2nd pill pack.  BTB resolved but pt states she feels different with higher dose OCP. Has been having lightheadedness for past 2 wks. Was waiting for thyroid rechk before seeing PCP, but since that is normal, suggested she f/u with PCP for further eval. Could be vertigo, BP, etc. Pt denies any nasal congestion/allergy sx. If eval is neg, then will change pills. Due to finish pill pack end of next wk so could change before time to start new pill pack. Pt to keep me posted.

## 2018-11-23 ENCOUNTER — Telehealth: Payer: Self-pay | Admitting: Obstetrics and Gynecology

## 2018-11-23 DIAGNOSIS — Z0000 Encounter for general adult medical examination without abnormal findings: Secondary | ICD-10-CM

## 2018-11-23 DIAGNOSIS — Z131 Encounter for screening for diabetes mellitus: Secondary | ICD-10-CM

## 2018-11-23 DIAGNOSIS — Z13220 Encounter for screening for lipoid disorders: Secondary | ICD-10-CM

## 2018-11-23 NOTE — Telephone Encounter (Signed)
Please see

## 2018-11-23 NOTE — Telephone Encounter (Signed)
Patient is scheduled for her annual on 12/14 with ABC, she is asking if lab order for annual labs could be put in so she could get these done before appt.  She would like a message via mychart letting her know when these are in so she could get to [facility] and get them done.

## 2018-11-23 NOTE — Telephone Encounter (Signed)
Lab orders placed, pt msg sent through [place].

## 2018-11-26 ENCOUNTER — Encounter: Payer: Self-pay | Admitting: Obstetrics and Gynecology

## 2018-11-26 NOTE — Telephone Encounter (Signed)
Pt left msg on triage line as well.

## 2018-11-26 NOTE — Telephone Encounter (Signed)
Called pt, no answer, LVMTRC. 

## 2018-11-26 NOTE — Telephone Encounter (Signed)
Pls call pt. OCPs changed 9/20 due to BTB on Lo loestrin. BTB should be improving. If not, pls scehd for GYN u/s. I'm out of office now and can't call pt today. If this bleeding is first time it's happened since changing pills, that's ok and can then f/u at 12/20 annual. Thx.

## 2018-11-27 ENCOUNTER — Telehealth: Payer: Self-pay | Admitting: Obstetrics and Gynecology

## 2018-11-27 MED ORDER — MICROGESTIN 24 FE 1-20 MG-MCG PO TABS: 1.0000 | ORAL_TABLET | Freq: Every day | ORAL | 0 refills | Status: DC

## 2018-11-27 NOTE — Telephone Encounter (Signed)
Spoke with pt. Just not feeling great on placebo pills of aviane. Felt better on Lo Loestrin but had BTB. Will try lomedia, Rx eRxd. Start with next pill pack. F/u at 12/20 annual.

## 2018-12-08 ENCOUNTER — Encounter: Payer: Self-pay | Admitting: Emergency Medicine

## 2018-12-08 ENCOUNTER — Other Ambulatory Visit: Payer: Self-pay

## 2018-12-08 ENCOUNTER — Ambulatory Visit
Admission: EM | Admit: 2018-12-08 | Discharge: 2018-12-08 | Disposition: A | Discharge status: Home / Self Care | Payer: Federal, State, Local not specified - PPO | Attending: Urgent Care | Admitting: Urgent Care

## 2018-12-08 DIAGNOSIS — J069 Acute upper respiratory infection, unspecified: Secondary | ICD-10-CM | POA: Diagnosis not present

## 2018-12-08 MED ORDER — AZITHROMYCIN 250 MG PO TABS: ORAL_TABLET | ORAL | 0 refills | Status: DC

## 2018-12-08 MED ORDER — BENZONATATE 200 MG PO CAPS: 200.0000 mg | ORAL_CAPSULE | Freq: Three times a day (TID) | ORAL | 0 refills | Status: DC | PRN

## 2018-12-08 NOTE — ED Triage Notes (Addendum)
Patient c/o productive cough that started 2 weeks ago. She denies any other symptoms. She refuses COVID testing

## 2018-12-08 NOTE — Discharge Instructions (Signed)
It was very nice seeing you today in clinic. Thank you for entrusting me with your care.   Rest and increase fluid intake. Please utilize the medications that we discussed. Your prescriptions has been called in to your pharmacy.   Make arrangements to follow up with your regular doctor in 1 week for re-evaluation if not improving. If your symptoms/condition worsens, please seek follow up care either here or in the ER. Please remember, our Branch providers are "right here with you" when you need [place].   Again, it was my pleasure to take care of you today. Thank you for choosing our clinic. I hope that you start to feel better quickly.   Honor Loh, MSN, APRN, FNP-C, CEN Advanced Practice Provider [facility]

## 2018-12-09 NOTE — ED Provider Notes (Signed)
Donna Zuniga, Donna Zuniga   Name: Donna Zuniga DOB: 1975-11-04 MRN: [id number] CSN: [id number] PCP: Ricardo Jericho, NP  Arrival date and time:  12/08/18 1703  Chief Complaint:  Cough   NOTE: Prior to seeing the patient today, I have reviewed the triage nursing documentation and vital signs. Clinical staff has updated patient's PMH/PSHx, current medication list, and drug allergies/intolerances to ensure comprehensive history available to assist in medical decision making.   History:   HPI: Donna Zuniga is a 43 y.o. female who presents today with complaints of a 2 week history of a productive cough. She denies any associated fevers. She has been using Delsym and Mucinex, which have helped her to be able to cough up the sputum rather than it settling in her chest. Over the course of the last few days, patient notes that her sputum has changed to being thicker and yellow/green in color. She has Cough is worse at night and when supine. She states, "I can hear my self rattling". Patient has been more fatigued and has had some minor congestion. She denies that she has experienced any nausea, vomiting, diarrhea, or abdominal pain. She is eating and drinking well. Patient denies any perceived alterations to her sense of taste or smell.  Patient denies being in close contact with anyone known to be ill. She has never been tested for SARS-CoV-2 (novel coronavirus) per her report and refuses to be tested while in clinic today.   Past Medical History:  Diagnosis Date   Anxiety    Cervical dysplasia    Depression    Family history of ovarian cancer    doesn't meet BC[employer] Fed genetic testing guidelines.   Heart palpitations    Dr. Posey Pronto at [facility]   Leiomyoma 2014   Vitamin D deficiency 11/2015    Past Surgical History:  Procedure Laterality Date   COLPOSCOPY     LEEP      Family History  Problem Relation Age of Onset   Ovarian cancer Maternal Aunt 48   Hypertension Father     Other Mother        fibroids/leiomyoma of uterus    Social History   Tobacco Use   Smoking status: Former Smoker   Smokeless tobacco: Never Used  Substance Use Topics   Alcohol use: Yes    Comment: occ   Drug use: No    Patient Active Problem List   Diagnosis Date Noted   Family history of ovarian cancer 12/17/2017   Enlarged thyroid 12/10/2016   Nocturia 12/10/2016   Pelvic pain 09/16/2016   Leiomyoma 09/16/2016    Home Medications:    Current Meds  Medication Sig   Cholecalciferol (VITAMIN D3) 2000 units capsule Take 1 capsule by mouth daily.   Norethindrone Acetate-Ethinyl Estrad-FE (MICROGESTIN 24 FE) 1-20 MG-MCG(24) tablet Take 1 tablet by mouth daily.    Allergies:   Bupropion  Review of Systems (ROS): Review of Systems  Constitutional: Positive for fatigue. Negative for fever.  HENT: Positive for congestion. Negative for ear pain, postnasal drip, rhinorrhea, sinus pressure, sinus pain, sneezing and sore throat.   Eyes: Negative for pain, discharge and redness.  Respiratory: Positive for cough (productive) and shortness of breath (mild). Negative for chest tightness.   Cardiovascular: Negative for chest pain and palpitations.  Gastrointestinal: Negative for abdominal pain, diarrhea, nausea and vomiting.  Musculoskeletal: Negative for arthralgias, back pain, myalgias and neck pain.  Skin: Negative for color change, pallor and rash.  Neurological: Negative for dizziness, syncope, weakness  and headaches.  Hematological: Negative for adenopathy.     Vital Signs: Today's Vitals   12/08/18 1744 12/08/18 1745 12/08/18 1747 12/08/18 1823  BP:   (!) 144/82   Pulse:   80   Resp:   18   Temp:   98.6 F (37 C)   TempSrc:   Oral   SpO2:   100%   Weight:  205 lb (93 kg)    Height:  5\' 8"  (1.727 m)    PainSc: 0-No pain   0-No pain    Physical Exam: Physical Exam  Constitutional: She is oriented to person, place, and time and well-developed,  well-nourished, and in no distress. No distress.  HENT:  Head: Normocephalic and atraumatic.  Right Ear: Tympanic membrane normal.  Left Ear: Tympanic membrane normal.  Nose: Mucosal edema (mild) present. No sinus tenderness.  Mouth/Throat: Uvula is midline, oropharynx is clear and moist and mucous membranes are normal.  Eyes: Pupils are equal, round, and reactive to light. Conjunctivae and EOM are normal.  Neck: Normal range of motion. Neck supple.  Cardiovascular: Normal rate, regular rhythm, normal heart sounds and intact distal pulses. Exam reveals no gallop and no friction rub.  No murmur heard. Pulmonary/Chest: Effort normal. No respiratory distress. She has no decreased breath sounds. She has no wheezes. She has rhonchi (scattered). She has no rales.  Deep cough in clinic. SPO2 100% on RA.   Abdominal: Soft. Normal appearance and bowel sounds are normal. She exhibits no distension. There is no abdominal tenderness.  Musculoskeletal: Normal range of motion.  Lymphadenopathy:       Head (right side): Submandibular adenopathy present.       Head (left side): Submandibular adenopathy present.  Neurological: She is alert and oriented to person, place, and time. Gait normal.  Skin: Skin is warm and dry. No rash noted. She is not diaphoretic.  Psychiatric: Mood, memory, affect and judgment normal.  Nursing note and vitals reviewed.   Urgent Care Treatments / Results:   LABS: PLEASE NOTE: all labs that were ordered this encounter are listed, however only abnormal results are displayed. Labs Reviewed - No data to display  EKG: -None  RADIOLOGY: No results found.  PROCEDURES: Procedures  MEDICATIONS RECEIVED THIS VISIT: Medications - No data to display  PERTINENT CLINICAL COURSE NOTES/UPDATES:   Initial Impression / Assessment and Plan / Urgent Care Course:  Pertinent labs & imaging results that were available during my care of the patient were personally reviewed by me and  considered in my medical decision making (see lab/imaging section of note for values and interpretations).  Donna Zuniga is a 43 y.o. female who presents to [facility] today with complaints of Cough   Patient overall well appearing and in no acute distress today in clinic. Presenting symptoms (see HPI) and exam as documented above. She presents with symptoms associated with SARS-CoV-2 (novel coronavirus). Discussed typical symptom constellation. Reviewed potential for infection and need for testing, however patient refused to be tested today. In the absence of testing, patient was encouraged to follow CDC and [place] DHHS guidelines regarding self quarantine until symptoms resolve/improve.   Regarding cough today. Discussed have chest radiograph performed to assess for infiltrates/consolidation that would be suggestive of a community acquired pneumonia. Patient declines radiographs and wishes to proceed with treatment. Given the chronicity of her symptoms, and the fact that she feels like she is worse despite conservative treatment at home, will proceed with treatment using a 5 day course of  azithromycin. Discussed supportive care. Will also send in a supply of benzonatate for PRN use to help with her cough.   Current clinical condition warrants patient being out of work in order to quarantine while waiting for testing results. She was provided with the appropriate documentation to provide to her place of employment that will allow for her to RTW on 12/10/2018. Again, I reminded her that to exercise social distancing, practice frequent handwashing, and wear a mask at all times.   Discussed follow up with primary care physician in 1 week for re-evaluation. I have reviewed the follow up and strict return precautions for any new or worsening symptoms. Patient is aware of symptoms that would be deemed urgent/emergent, and would thus require further evaluation either here or in the emergency  department. At the time of discharge, she verbalized understanding and consent with the discharge plan as it was reviewed with her. All questions were fielded by provider and/or clinic staff prior to patient discharge.    Final Clinical Impressions / Urgent Care Diagnoses:   Final diagnoses:  Acute upper respiratory infection    New Prescriptions:  [place] Controlled Substance Registry consulted? Not Applicable  Meds ordered this encounter  Medications   azithromycin (ZITHROMAX) 250 MG tablet    Sig: 2 tabs (500 mg) x 1 day, then 1 tab (250 mg) x 4 days.    Dispense:  6 tablet    Refill:  0   benzonatate (TESSALON) 200 MG capsule    Sig: Take 1 capsule (200 mg total) by mouth 3 (three) times daily as needed for cough.    Dispense:  15 capsule    Refill:  0    Recommended Follow up Care:  Patient encouraged to follow up with the following provider within the specified time frame, or sooner as dictated by the severity of her symptoms. As always, she was instructed that for any urgent/emergent care needs, she should seek care either here or in the emergency department for more immediate evaluation.  Follow-up Information    Ricardo Jericho, NP In 1 week.   Specialty: Family Medicine Why: General reassessment of symptoms if not improving Contact information: [address] Donna Zuniga [id number] [phone]         NOTE: This note was prepared using Dragon dictation software along with smaller phrase technology. Despite my best ability to proofread, there is the potential that transcriptional errors may still occur from this process, and are completely unintentional.    Karen Kitchens, NP 12/09/18 1850

## 2018-12-21 ENCOUNTER — Other Ambulatory Visit (HOSPITAL_COMMUNITY)
Admission: RE | Admit: 2018-12-21 | Discharge: 2018-12-21 | Disposition: A | Discharge status: Home / Self Care | Payer: Federal, State, Local not specified - PPO | Source: Ambulatory Visit | Attending: Obstetrics and Gynecology | Admitting: Obstetrics and Gynecology

## 2018-12-21 ENCOUNTER — Other Ambulatory Visit: Payer: Self-pay

## 2018-12-21 ENCOUNTER — Encounter: Payer: Self-pay | Admitting: Obstetrics and Gynecology

## 2018-12-21 ENCOUNTER — Ambulatory Visit (INDEPENDENT_AMBULATORY_CARE_PROVIDER_SITE_OTHER): Payer: Federal, State, Local not specified - PPO | Admitting: Obstetrics and Gynecology

## 2018-12-21 VITALS — BP 140/90 | Ht 67.0 in | Wt 211.0 lb

## 2018-12-21 DIAGNOSIS — Z01419 Encounter for gynecological examination (general) (routine) without abnormal findings: Secondary | ICD-10-CM | POA: Diagnosis not present

## 2018-12-21 DIAGNOSIS — Z3041 Encounter for surveillance of contraceptive pills: Secondary | ICD-10-CM

## 2018-12-21 DIAGNOSIS — Z124 Encounter for screening for malignant neoplasm of cervix: Secondary | ICD-10-CM

## 2018-12-21 DIAGNOSIS — Z8041 Family history of malignant neoplasm of ovary: Secondary | ICD-10-CM

## 2018-12-21 DIAGNOSIS — Z1231 Encounter for screening mammogram for malignant neoplasm of breast: Secondary | ICD-10-CM

## 2018-12-21 MED ORDER — AUROVELA 24 FE 1-20 MG-MCG(24) PO TABS: 1.0000 | ORAL_TABLET | Freq: Every day | ORAL | 3 refills | Status: DC

## 2018-12-21 NOTE — Patient Instructions (Signed)
I value your feedback and entrusting us with your care. If you get a [facility] patient survey, I would appreciate you taking the time to let us know about your experience today. Thank you!  As of December 17, 2018, your lab results will be released to your MyChart immediately, before I even have a chance to see them. Please give me time to review them and contact you if there are any abnormalities. Thank you for your patience.  

## 2018-12-21 NOTE — Progress Notes (Signed)
PCP:  Ricardo Jericho, NP   Chief Complaint  Patient presents with  . Gynecologic Exam     HPI:      [name] is a 43 y.o. No obstetric history on file. who LMP was Patient's last menstrual period was 12/06/2018 (approximate)., presents today for her annual examination.  Her menses were regular every 28-30 days, lasting 2-3 days, light flow with Lo Loestrin last yr, but then started having BTB. Changed to aviane for cycle control but didn't feel well on them. so changed to [facility] 11/20 and pt doing well so far. Hasn't done placebo wk of first pack yet.     Sex activity: single partner, contraception - OCP (estrogen/progesterone).  Last Pap: 12/17/17  Results were: no abnormalities /neg HPV DNA 2017. Likes yearly paps. 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 Plans to do at [facility] with PCP. There is a FH of breast cancer in 2 maternal cousins. There is a FH of ovarian cancer in her mat aunt, genetic testing hasn't been done because pt doesn't meet [employer] guidelines, but given new breast cancer dx, can try to get testing covered. The patient does do self-breast exams.  Tobacco use: Quit tobacco several yrs ago Alcohol use: none No drug use.  Exercise: moderately active  She does get adequate calcium and Vitamin D in her diet.  She has a hx of enlarged thyroid, followed by PCP. Other labs with PCP, too. Due for fasting labs--orders in system from 11/23/18.  Pt with hx of depression in past, mostly due to grief/loss. Doing ok now.  Pt also trying to lose wt. Has Rx phentermine but hasn't done it due to increased PVCs, followed by cardio.  Past Medical History:  Diagnosis Date  . Anxiety   . Cervical dysplasia   . Depression   . Family history of ovarian cancer    doesn't meet BCBS Fed genetic testing guidelines.  Marland Kitchen Heart palpitations    Dr. Posey Pronto at [facility]  . Leiomyoma 2014  . Vitamin D  deficiency 11/2015    Past Surgical History:  Procedure Laterality Date  . COLPOSCOPY    . LEEP      Family History  Problem Relation Age of Onset  . Ovarian cancer Maternal Aunt 97  . Hypertension Father   . Other Mother        fibroids/leiomyoma of uterus  . Breast cancer Cousin   . Breast cancer Cousin     Social History   Socioeconomic History  . Marital status: Married    Spouse name: Not on file  . Number of children: Not on file  . Years of education: Not on file  . Highest education level: Not on file  Occupational History  . Not on file  Tobacco Use  . Smoking status: Former Research scientist (life sciences)  . Smokeless tobacco: Never Used  Substance and Sexual Activity  . Alcohol use: Yes    Comment: occ  . Drug use: No  . Sexual activity: Yes    Birth control/protection: Pill  Other Topics Concern  . Not on file  Social History Narrative  . Not on file   Social Determinants of Health   Financial Resource Strain:   . Difficulty of Paying Living Expenses: Not on file  Food Insecurity:   . Worried About Charity fundraiser in the Last Year: Not on file  . Ran Out of  Food in the Last Year: Not on file  Transportation Needs:   . Lack of Transportation (Medical): Not on file  . Lack of Transportation (Non-Medical): Not on file  Physical Activity:   . Days of Exercise per Week: Not on file  . Minutes of Exercise per Session: Not on file  Stress:   . Feeling of Stress : Not on file  Social Connections:   . Frequency of Communication with Friends and Family: Not on file  . Frequency of Social Gatherings with Friends and Family: Not on file  . Attends Religious Services: Not on file  . Active Member of Clubs or Organizations: Not on file  . Attends Archivist Meetings: Not on file  . Marital Status: Not on file  Intimate Partner Violence:   . Fear of Current or Ex-Partner: Not on file  . Emotionally Abused: Not on file  . Physically Abused: Not on file  .  Sexually Abused: Not on file    Current Meds  Medication Sig  . Cholecalciferol (VITAMIN D3) 2000 units capsule Take 1 capsule by mouth daily.  Marland Kitchen MAGNESIUM-OXIDE 400 (241.3 Mg) MG tablet Take 1 tablet by mouth daily.  . Norethindrone Acetate-Ethinyl Estrad-FE (AUROVELA 24 FE) 1-20 MG-MCG(24) tablet Take 1 tablet by mouth daily.  . [DISCONTINUED] Norethindrone Acetate-Ethinyl Estrad-FE (AUROVELA 24 FE) 1-20 MG-MCG(24) tablet TK 1 T PO D     ROS:  Review of Systems  Constitutional: Negative for fatigue, fever and unexpected weight change.  Respiratory: Negative for cough, shortness of breath and wheezing.   Cardiovascular: Negative for chest pain, palpitations and leg swelling.  Gastrointestinal: Negative for blood in stool, constipation, diarrhea, nausea and vomiting.  Endocrine: Negative for cold intolerance, heat intolerance and polyuria.  Genitourinary: Negative for dyspareunia, dysuria, flank pain, frequency, genital sores, hematuria, menstrual problem, pelvic pain, urgency, vaginal bleeding, vaginal discharge and vaginal pain.  Musculoskeletal: Negative for back pain, joint swelling and myalgias.  Skin: Negative for rash.  Neurological: Negative for dizziness, syncope, light-headedness, numbness and headaches.  Hematological: Negative for adenopathy.  Psychiatric/Behavioral: Negative for agitation, confusion, dysphoric mood, sleep disturbance and suicidal ideas. The patient is not nervous/anxious.      Objective: BP 140/90   Ht 5\' 7"  (1.702 m)   Wt 211 lb (95.7 kg)   LMP 12/06/2018 (Approximate)   BMI 33.05 kg/m  Pt got in argument before appt. Normal BP at 9/20 appt and at Urgent care 12/20.   Physical Exam Constitutional:      Appearance: She is well-developed.  Genitourinary:     Vulva, vagina, uterus, right adnexa and left adnexa normal.     No vulval lesion or tenderness noted.     No vaginal discharge, erythema or tenderness.     No cervical motion tenderness or  polyp.     Uterus is not enlarged or tender.     No right or left adnexal mass present.     Right adnexa not tender.     Left adnexa not tender.  Neck:     Thyroid: No thyromegaly.  Cardiovascular:     Rate and Rhythm: Normal rate and regular rhythm.     Heart sounds: Normal heart sounds. No murmur.  Pulmonary:     Effort: Pulmonary effort is normal.     Breath sounds: Normal breath sounds.  Chest:     Breasts:        Right: No mass, nipple discharge, skin change or tenderness.  Left: No mass, nipple discharge, skin change or tenderness.  Abdominal:     Palpations: Abdomen is soft.     Tenderness: There is no abdominal tenderness. There is no guarding.  Musculoskeletal:        General: Normal range of motion.     Cervical back: Normal range of motion.  Neurological:     General: No focal deficit present.     Mental Status: She is alert and oriented to person, place, and time.     Cranial Nerves: No cranial nerve deficit.  Skin:    General: Skin is warm and dry.  Psychiatric:        Mood and Affect: Mood normal.        Behavior: Behavior normal.        Thought Content: Thought content normal.        Judgment: Judgment normal.  Vitals reviewed.     Assessment/Plan: Encounter for annual routine gynecological examination  Cervical cancer screening - Plan: [facility] PAP; pt pref. Aware she may get bill from [place].  Encounter for surveillance of contraceptive pills - Plan: Norethindrone Acetate-Ethinyl Estrad-FE (AUROVELA 24 FE) 1-20 MG-MCG(24) tablet; OCP RF. F/u prn BTB.  Encounter for screening mammogram for malignant neoplasm of breast - Plan: 3D MAMMOGRAM SCREENING BILATERAL; pt to sched mammo.  Family history of ovarian cancer--My[employer] testing discussed, handout given. Pt to f/u if desires testing.  Fasting labs--order in system. Will f/u with results.   Meds ordered this encounter  Medications  . Norethindrone Acetate-Ethinyl Estrad-FE (AUROVELA 24 FE) 1-20  MG-MCG(24) tablet    Sig: Take 1 tablet by mouth daily.    Dispense:  3 Package    Refill:  3    Order Specific Question:   Supervising Provider    Answer:   Gae Dry [id number]        GYN counsel breast self exam, mammography screening, adequate intake of calcium and vitamin D, diet and exercise     F/U  Return in about 1 year (around 12/21/2019).  Exa Bomba B. Alette Kataoka, PA-C 12/22/2018 9:17 AM

## 2018-12-22 ENCOUNTER — Encounter: Payer: Self-pay | Admitting: Obstetrics and Gynecology

## 2018-12-23 LAB — CYTOLOGY - PAP: Diagnosis: NEGATIVE

## 2018-12-27 ENCOUNTER — Other Ambulatory Visit: Payer: Self-pay | Admitting: Obstetrics and Gynecology

## 2018-12-27 DIAGNOSIS — Z3041 Encounter for surveillance of contraceptive pills: Secondary | ICD-10-CM

## 2019-01-05 ENCOUNTER — Other Ambulatory Visit: Payer: Federal, State, Local not specified - PPO

## 2019-01-05 ENCOUNTER — Other Ambulatory Visit: Payer: Self-pay | Admitting: Obstetrics and Gynecology

## 2019-01-05 DIAGNOSIS — Z131 Encounter for screening for diabetes mellitus: Secondary | ICD-10-CM

## 2019-01-06 ENCOUNTER — Telehealth: Payer: Self-pay | Admitting: Obstetrics and Gynecology

## 2019-01-06 LAB — COMPREHENSIVE METABOLIC PANEL
ALT: 10 IU/L (ref 0–32)
AST: 12 IU/L (ref 0–40)
Albumin/Globulin Ratio: 1.4 (ref 1.2–2.2)
Albumin: 4.2 g/dL (ref 3.8–4.8)
Alkaline Phosphatase: 61 IU/L (ref 39–117)
BUN/Creatinine Ratio: 15 (ref 9–23)
BUN: 15 mg/dL (ref 6–24)
Bilirubin Total: 0.2 mg/dL (ref 0.0–1.2)
CO2: 22 mmol/L (ref 20–29)
Calcium: 9.5 mg/dL (ref 8.7–10.2)
Chloride: 103 mmol/L (ref 96–106)
Creatinine, Ser: 1.02 mg/dL — ABNORMAL HIGH (ref 0.57–1.00)
GFR calc Af Amer: 78 mL/min/{1.73_m2} (ref >59)
GFR calc non Af Amer: 68 mL/min/{1.73_m2} (ref >59)
Globulin, Total: 3.1 g/dL (ref 1.5–4.5)
Glucose: 103 mg/dL — ABNORMAL HIGH (ref 65–99)
Potassium: 4 mmol/L (ref 3.5–5.2)
Sodium: 143 mmol/L (ref 134–144)
Total Protein: 7.3 g/dL (ref 6.0–8.5)

## 2019-01-06 LAB — LIPID PANEL
Chol/HDL Ratio: 3.1 ratio (ref 0.0–4.4)
Cholesterol, Total: 157 mg/dL (ref 100–199)
HDL: 51 mg/dL (ref >39)
LDL Chol Calc (NIH): 84 mg/dL (ref 0–99)
Triglycerides: 127 mg/dL (ref 0–149)
VLDL Cholesterol Cal: 22 mg/dL (ref 5–40)

## 2019-01-06 LAB — HEMOGLOBIN A1C
Est. average glucose Bld gHb Est-mCnc: 126 mg/dL
Hgb A1c MFr Bld: 6 % — ABNORMAL HIGH (ref 4.8–5.6)

## 2019-01-06 NOTE — Telephone Encounter (Signed)
Patient calling to follow up on concerns with labwork.  [phone].

## 2019-01-06 NOTE — Telephone Encounter (Signed)
Spoke with pt re: recent labs. WNL except pre-DM. Pt had lost wt and was exercising last yr but has gained it back and not exercising. Plans to restart Jan. Will rechk HgA1C in 6 months. Pt also interested in wt loss meds and referred to Dr. Georgianne Fick in [place] office. Pt's PCP started her on HCTZ 12/20 for LE edema and elevated BP readings (had one in our office 12/20 annual but usually normal). Pt's PCP then retired so getting established with new one.

## 2019-01-06 NOTE — Addendum Note (Signed)
Addended by: Ardeth Perfect B on: [id number] 12:46 PM   Modules accepted: Orders

## 2019-01-20 ENCOUNTER — Ambulatory Visit (INDEPENDENT_AMBULATORY_CARE_PROVIDER_SITE_OTHER): Payer: Federal, State, Local not specified - PPO | Admitting: Obstetrics and Gynecology

## 2019-01-20 ENCOUNTER — Encounter: Payer: Self-pay | Admitting: Obstetrics and Gynecology

## 2019-01-20 ENCOUNTER — Other Ambulatory Visit: Payer: Self-pay

## 2019-01-20 VITALS — BP 146/80 | Ht 68.0 in | Wt 210.0 lb

## 2019-01-20 DIAGNOSIS — I493 Ventricular premature depolarization: Secondary | ICD-10-CM

## 2019-01-20 DIAGNOSIS — I10 Essential (primary) hypertension: Secondary | ICD-10-CM

## 2019-01-20 DIAGNOSIS — R7303 Prediabetes: Secondary | ICD-10-CM | POA: Diagnosis not present

## 2019-01-20 DIAGNOSIS — E669 Obesity, unspecified: Secondary | ICD-10-CM | POA: Diagnosis not present

## 2019-01-20 DIAGNOSIS — Z6831 Body mass index (BMI) 31.0-31.9, adult: Secondary | ICD-10-CM

## 2019-01-20 MED ORDER — NOVOFINE 32G X 6 MM MISC: 1.0000 [IU] | Freq: Every day | 3 refills | Status: DC

## 2019-01-20 MED ORDER — SAXENDA 18 MG/3ML ~~LOC~~ SOPN: PEN_INJECTOR | SUBCUTANEOUS | 3 refills | Status: DC

## 2019-01-20 NOTE — Progress Notes (Signed)
Gynecology Office Visit  Chief Complaint:  Chief Complaint  Patient presents with  . Weight Check    History of Present Illness:  Patientis a 44 y.o. [id number] female, who presents for the evaluation of weight gain. She has gained 20 pounds primarily over 10 months. The patient states the following issues have contributed to her weight problem: covid and decreased physical activity.  The patient has no additional symptoms. The patient specifically denies memory loss, muscle weakness, excessive thirst, and polyuria. Pertinent co-morbidities include HTN, PVC's. She has tried phentermine interventions in the past with good success.    Review of Systems: 10 point review of systems negative unless otherwise noted in HPI  Past Medical History:  Past Medical History:  Diagnosis Date  . Anxiety   . Cervical dysplasia   . Depression   . Family history of ovarian cancer    doesn't meet BCBS Fed genetic testing guidelines.  Marland Kitchen Heart palpitations    Dr. Posey Pronto at [facility]  . Leiomyoma 2014  . Vitamin D deficiency 11/2015    Past Surgical History:  Past Surgical History:  Procedure Laterality Date  . COLPOSCOPY    . LEEP      Gynecologic History: No LMP recorded.  Obstetric History: [id number]  Family History:  Family History  Problem Relation Age of Onset  . Ovarian cancer Maternal Aunt 23  . Hypertension Father   . Other Mother        fibroids/leiomyoma of uterus  . Breast cancer Cousin   . Breast cancer Cousin     Social History:  Social History   Socioeconomic History  . Marital status: Married    Spouse name: Not on file  . Number of children: Not on file  . Years of education: Not on file  . Highest education level: Not on file  Occupational History  . Not on file  Tobacco Use  . Smoking status: Former Research scientist (life sciences)  . Smokeless tobacco: Never Used  Substance and Sexual Activity  . Alcohol use: Yes    Comment: occ  . Drug use: No  . Sexual activity: Yes    Birth  control/protection: Pill  Other Topics Concern  . Not on file  Social History Narrative  . Not on file   Social Determinants of Health   Financial Resource Strain:   . Difficulty of Paying Living Expenses: Not on file  Food Insecurity:   . Worried About Charity fundraiser in the Last Year: Not on file  . Ran Out of Food in the Last Year: Not on file  Transportation Needs:   . Lack of Transportation (Medical): Not on file  . Lack of Transportation (Non-Medical): Not on file  Physical Activity:   . Days of Exercise per Week: Not on file  . Minutes of Exercise per Session: Not on file  Stress:   . Feeling of Stress : Not on file  Social Connections:   . Frequency of Communication with Friends and Family: Not on file  . Frequency of Social Gatherings with Friends and Family: Not on file  . Attends Religious Services: Not on file  . Active Member of Clubs or Organizations: Not on file  . Attends Archivist Meetings: Not on file  . Marital Status: Not on file  Intimate Partner Violence:   . Fear of Current or Ex-Partner: Not on file  . Emotionally Abused: Not on file  . Physically Abused: Not on file  . Sexually Abused:  Not on file    Allergies:  Allergies  Allergen Reactions  . Bupropion Other (See Comments)    Medications: Prior to Admission medications   Medication Sig Start Date End Date Taking? Authorizing Provider  Cholecalciferol (VITAMIN D3) 2000 units capsule Take 1 capsule by mouth daily.    [provider]  MAGNESIUM-OXIDE 400 (241.3 Mg) MG tablet Take 1 tablet by mouth daily. 11/09/18   [provider]  Norethindrone Acetate-Ethinyl Estrad-FE (AUROVELA 24 FE) 1-20 MG-MCG(24) tablet Take 1 tablet by mouth daily. 123XX123   Copland, Elmo Putt B, PA-C  phentermine (ADIPEX-P) 37.5 MG tablet TAKE 1 TABLET(37.5 MG) BY MOUTH DAILY BEFORE BREAKFAST 09/09/18 123XX123  Copland, Deirdre Evener, PA-C    Physical Exam Blood pressure (!) 146/80, height 5\' 8"   (1.727 m), weight 210 lb (95.3 kg). Wt Readings from Last 3 Encounters:  01/20/19 210 lb (95.3 kg)  12/21/18 211 lb (95.7 kg)  12/08/18 205 lb (93 kg)  Body mass index is 31.93 kg/m.   General: NAD HEENT: normocephalic, anicteric Thyroid: no enlargement Pulmonary: no increased work of breathing Neurologic: Grossly intact Psychiatric: mood appropriate, affect full  Assessment: 44 y.o. [id number]  medical weight loss consultation  Plan: Problem List Items Addressed This Visit    None    Visit Diagnoses    PVC (premature ventricular contraction)    -  Primary   Essential hypertension       Class 1 obesity with serious comorbidity and body mass index (BMI) of 31.0 to 31.9 in adult, unspecified obesity type       Relevant Medications   Liraglutide -Weight Management (SAXENDA) 18 MG/3ML SOPN   Prediabetes          1) 1500 Calorie ADA Diet  2) Patient education given regarding appropriate lifestyle changes for weight loss including: regular physical activity, healthy coping strategies, caloric restriction and healthy eating patterns.  3) Patient will be started on weight loss medication. The risks and benefits and side effects of medication, such as Adipex (Phenteramine) ,  Tenuate (Diethylproprion), Belviq (lorcarsin), Contrave (buproprion/naltrexone), Qsymia (phentermine/topiramate), and Saxenda (liraglutide) is discussed. The pros and cons of suppressing appetite and boosting metabolism is discussed. Risks of tolerence and addiction is discussed for selected agents discussed. Use of medicine will ne short term, such as 3-4 months at a time followed by a period of time off of the medicine to avoid these risks and side effects for Adipex, Qsymia, and Tenuate discussed. Pt to call with any negative side effects and agrees to keep follow up appts. - recommend trial of saxenda given co-morbidities  4) Comorbidity Screening - hypothyroidism screening, diabetes, and hyperlipidemia screening  offered  5) Encouraged weekly weight monitorig to track progress and sample 1 week food diary  6) Contraception - discussed that all weight loss drugs fall in to pregnancy category X, patient currently has reliable contraception in the form of OCP, given HTN and age switched to progestin only slynd at today's visit  7) 15 minutes face-to-face; counseling/coordination of care > 50 percent of visit  8) Follow up in 3 months  to assess response    [name], MD, [place], [facility] 01/20/2019, 4:43 PM

## 2019-01-23 DIAGNOSIS — F331 Major depressive disorder, recurrent, moderate: Secondary | ICD-10-CM | POA: Diagnosis not present

## 2019-02-09 ENCOUNTER — Encounter: Payer: Self-pay | Admitting: Obstetrics and Gynecology

## 2019-02-11 ENCOUNTER — Other Ambulatory Visit: Payer: Self-pay | Admitting: Obstetrics and Gynecology

## 2019-02-11 MED ORDER — PHENTERMINE HCL 37.5 MG PO TABS: 37.5000 mg | ORAL_TABLET | Freq: Every day | ORAL | 0 refills | Status: DC

## 2019-02-11 NOTE — Telephone Encounter (Signed)
Medication follow up in person 4 weeks

## 2019-02-11 NOTE — Telephone Encounter (Signed)
Patient is schedule for 03/08/19 with AMS

## 2019-02-15 ENCOUNTER — Other Ambulatory Visit: Payer: Self-pay | Admitting: Obstetrics and Gynecology

## 2019-02-15 MED ORDER — SLYND 4 MG PO TABS: 1.0000 | ORAL_TABLET | Freq: Every day | ORAL | 11 refills | Status: DC

## 2019-03-08 ENCOUNTER — Ambulatory Visit: Payer: Federal, State, Local not specified - PPO | Admitting: Obstetrics and Gynecology

## 2019-03-24 ENCOUNTER — Ambulatory Visit: Payer: Federal, State, Local not specified - PPO | Admitting: Obstetrics and Gynecology

## 2019-03-26 ENCOUNTER — Telehealth: Payer: Self-pay | Admitting: Obstetrics and Gynecology

## 2019-03-26 ENCOUNTER — Other Ambulatory Visit: Payer: Self-pay | Admitting: Obstetrics and Gynecology

## 2019-03-26 ENCOUNTER — Other Ambulatory Visit: Payer: Federal, State, Local not specified - PPO

## 2019-03-26 DIAGNOSIS — N912 Amenorrhea, unspecified: Secondary | ICD-10-CM

## 2019-03-26 NOTE — Telephone Encounter (Signed)
Patient is wanting a call back from Donna Zuniga to go over some concerns she is having. Please advise

## 2019-03-26 NOTE — Telephone Encounter (Signed)
Pt changed to slynd by Dr. Georgianne Fick due to increasing Bps. No menses on slynd. Had neg UPT. Will check estradiol level. If WNL, reassurance that some women won't bleed on pills.  Pt also with fatigue and her body "doesn't feel right". No energy/motivation. Normal thyroid/labs 10/20. Has pre-DM. Phentermine not making her feel better but hasn't taken it consistently per pt report. Hx of anxiety/depression, but pt doesn't think it's related. Has PCP appt 4/2 and f/u with Dr. Georgianne Fick 04/21/19.

## 2019-03-30 ENCOUNTER — Other Ambulatory Visit: Payer: Self-pay | Admitting: Obstetrics and Gynecology

## 2019-03-30 ENCOUNTER — Other Ambulatory Visit: Payer: Federal, State, Local not specified - PPO

## 2019-03-30 DIAGNOSIS — N911 Secondary amenorrhea: Secondary | ICD-10-CM

## 2019-03-30 NOTE — Progress Notes (Signed)
Secondary amenorrhea

## 2019-03-30 NOTE — Telephone Encounter (Signed)
Needs lab appointment orders in

## 2019-03-30 NOTE — Telephone Encounter (Signed)
I called and spoke with patient to schedule labs. Patient is requesting to have release to [place].

## 2019-03-31 DIAGNOSIS — Z20822 Contact with and (suspected) exposure to covid-19: Secondary | ICD-10-CM | POA: Insufficient documentation

## 2019-03-31 DIAGNOSIS — I10 Essential (primary) hypertension: Secondary | ICD-10-CM | POA: Insufficient documentation

## 2019-04-02 DIAGNOSIS — Z20822 Contact with and (suspected) exposure to covid-19: Secondary | ICD-10-CM | POA: Diagnosis not present

## 2019-04-04 ENCOUNTER — Ambulatory Visit
Admission: EM | Admit: 2019-04-04 | Discharge: 2019-04-04 | Disposition: A | Discharge status: Home / Self Care | Payer: Federal, State, Local not specified - PPO

## 2019-04-04 ENCOUNTER — Other Ambulatory Visit: Payer: Self-pay

## 2019-04-04 DIAGNOSIS — J019 Acute sinusitis, unspecified: Secondary | ICD-10-CM

## 2019-04-04 MED ORDER — CETIRIZINE-PSEUDOEPHEDRINE ER 5-120 MG PO TB12: 1.0000 | ORAL_TABLET | Freq: Every day | ORAL | 0 refills | Status: DC

## 2019-04-04 MED ORDER — AMOXICILLIN-POT CLAVULANATE 875-125 MG PO TABS: 1.0000 | ORAL_TABLET | Freq: Two times a day (BID) | ORAL | 0 refills | Status: AC

## 2019-04-04 NOTE — Discharge Instructions (Addendum)
It was very nice seeing you today in clinic. Thank you for entrusting me with your care.   Rest and Stay HYDRATED. Water and electrolyte containing beverages (Gatorade, Pedialyte) are best to prevent dehydration and electrolyte abnormalities. Use medications as prescribed. May use Tylenol and/or Ibuprofen as needed for pain/fever.   Make arrangements to follow up with your regular doctor in 1 week for re-evaluation if not improving. If your symptoms/condition worsens, please seek follow up care either here or in the ER. Please remember, our [place] providers are "right here with you" when you need [place].   Again, it was my pleasure to take care of you today. Thank you for choosing our clinic. I hope that you start to feel better quickly.   Honor Loh, MSN, APRN, FNP-C, CEN Advanced Practice Provider [facility]

## 2019-04-04 NOTE — ED Triage Notes (Addendum)
Pt reports symptoms starting on Wednesday. Got a PCR COVID test on Wednesday which was negative. Had fever but gone now. Has head pressure, ear pain, nasal drainage. "I think I have a sinus infection."

## 2019-04-05 NOTE — ED Provider Notes (Signed)
Donna Zuniga, [name]   Name: Donna Zuniga DOB: [birth year] MRN: [id number] CSN: [id number] PCP: Ricardo Jericho, NP  Arrival date and time:  04/04/19 1206  Chief Complaint:  Nasal Congestion, Otalgia, and Sore Throat  NOTE: Prior to seeing the patient today, I have reviewed the triage nursing documentation and vital signs. Clinical staff has updated patient's PMH/PSHx, current medication list, and drug allergies/intolerances to ensure comprehensive history available to assist in medical decision making.   History:   HPI: Donna Zuniga is a 44 y.o. female who presents today with complaints of fatigue, cough, congestion, paranasal sinus tenderness, sore throat, and chills that started approximately 4 days ago. Patient endorses fever to a Tmax of 100.1. Cough has been productive green sputum with no associated shortness of breath or wheezing. She denies that she has experienced any nausea, vomiting, diarrhea, or abdominal pain. She is eating and drinking well. Patient denies any perceived alterations to her sense of taste or smell. Patient denies being in close contact with anyone known to be ill; no one else is her home has experienced a similar symptom constellation. She has been tested for SARS-CoV-2 (novel coronavirus) in the past 14 days; last tested negative on 03/31/2019 per her report.  Patient has not been vaccinated for influenza this season. In efforts to conservatively manage her symptoms at home, the patient notes that she has used IBU, vitamin C, vitamin D, zinc, and OTC decongestants, which have not helped to improve her symptoms.    Past Medical History:  Diagnosis Date  . Anxiety   . Cervical dysplasia   . Depression   . Family history of ovarian cancer    doesn't meet BCBS Fed genetic testing guidelines.  Marland Kitchen Heart palpitations    Dr. Posey Pronto at [facility]  . Leiomyoma 2014  . Pre-diabetes   . Vitamin D deficiency 11/2015    Past Surgical History:  Procedure Laterality  Date  . COLPOSCOPY    . LEEP      Family History  Problem Relation Age of Onset  . Ovarian cancer Maternal Aunt 59  . Hypertension Father   . Other Mother        fibroids/leiomyoma of uterus  . Breast cancer Cousin   . Breast cancer Cousin     Social History   Tobacco Use  . Smoking status: Former Research scientist (life sciences)  . Smokeless tobacco: Never Used  Substance Use Topics  . Alcohol use: Yes    Comment: occ  . Drug use: No    Patient Active Problem List   Diagnosis Date Noted  . Family history of ovarian cancer 12/17/2017  . Overweight (BMI 25.0-29.9) 06/04/2017  . Enlarged thyroid 12/10/2016  . Nocturia 12/10/2016  . Pelvic pain 09/16/2016  . Leiomyoma 09/16/2016  . Situational mixed anxiety and depressive disorder 07/12/2016    Home Medications:    Current Meds  Medication Sig  . hydrochlorothiazide (MICROZIDE) 12.5 MG capsule Take by mouth.    Allergies:   Bupropion  Review of Systems (ROS):  Review of systems NEGATIVE unless otherwise noted in narrative H&P section.   Vital Signs: Today's Vitals   04/04/19 1220 04/04/19 1221 04/04/19 1256  BP:  126/81   Pulse:  (!) 105   Resp:  18   Temp:  98.1 F (36.7 C)   SpO2:  99%   Weight: 202 lb (91.6 kg)    Height: '5\' 8"'$  (1.727 m)    PainSc: 4   3     Physical Exam: Physical  Exam  Constitutional: She is oriented to person, place, and time and well-developed, well-nourished, and in no distress.  Acutely ill appearing; fatigued/listless.  HENT:  Head: Normocephalic and atraumatic.  Right Ear: No tenderness. Tympanic membrane is injected. Tympanic membrane is not bulging. A middle ear effusion (mild serous) is present.  Left Ear: No tenderness. Tympanic membrane is injected. Tympanic membrane is not bulging. A middle ear effusion (mild serous) is present.  Nose: Mucosal edema, rhinorrhea and sinus tenderness present.  Mouth/Throat: Uvula is midline and mucous membranes are normal. Posterior oropharyngeal erythema  present. No oropharyngeal exudate or posterior oropharyngeal edema.  Eyes: Pupils are equal, round, and reactive to light.  Cardiovascular: Regular rhythm, normal heart sounds and intact distal pulses. Tachycardia present.  Pulmonary/Chest: Effort normal. She has rhonchi (upper airways; clears completely with cough).  Mild cough noted in clinic. No SOB or increased WOB. No distress. Able to speak in complete sentences without difficulties. SPO2 99% on RA.  Neurological: She is alert and oriented to person, place, and time. Gait normal.  Skin: Skin is warm and dry. No rash noted. She is not diaphoretic.  Psychiatric: Mood, memory, affect and judgment normal.  Nursing note and vitals reviewed.   Urgent Care Treatments / Results:   No orders of the defined types were placed in this encounter.   LABS: PLEASE NOTE: all labs that were ordered this encounter are listed, however only abnormal results are displayed. Labs Reviewed - No data to display  EKG: -None  RADIOLOGY: No results found.  PROCEDURES: Procedures  MEDICATIONS RECEIVED THIS VISIT: Medications - No data to display  PERTINENT CLINICAL COURSE NOTES/UPDATES:   Initial Impression / Assessment and Plan / Urgent Care Course:  Pertinent labs & imaging results that were available during my care of the patient were personally reviewed by me and considered in my medical decision making (see lab/imaging section of note for values and interpretations).  Donna Zuniga is a 44 y.o. female who presents to [facility] today with complaints of Nasal Congestion, Otalgia, and Sore Throat  Patient acutely ill appearing (non-toxic) appearing in clinic today. She does not appear to be in any acute distress. Presenting symptoms (see HPI) and exam as documented above. Symptoms for 4 days despite conservative management with vitamin supplements and decongestants. She tested (-) for SARS-CoV-2 (novel coronavirus) on Wednesday of this  week. Given exam, and PMH (+) for recurrent sinus issues, will proceed with treatment for sinusitis using a 7 day course of amoxicillin-clavulanate. Discussed supportive care measures at home during acute phase of illness. Patient to rest as much as possible. She was encouraged to ensure adequate hydration (water and ORS) to prevent dehydration and electrolyte derangements. Patient may use APAP and/or IBU on an as needed basis for pain/fever.  Intervention for cough offered, however patient declined citing that her symptoms are mild/controlled. May use PTC preparations for cough as needed. Will also send in prescription for cetirizine-D to help with congestion and to help abate MEEs.   Current clinical condition warrants patient being out of work in order to recover from her current illness. She was provided with the appropriate documentation to provide to her place of employment that will allow for her to RTW on 04/07/2019 with no restrictions.     Discussed follow up with primary care physician in 1 week for re-evaluation. I have reviewed the follow up and strict return precautions for any new or worsening symptoms. Patient is aware of symptoms that would be  deemed urgent/emergent, and would thus require further evaluation either here or in the emergency department. At the time of discharge, she verbalized understanding and consent with the discharge plan as it was reviewed with her. All questions were fielded by provider and/or clinic staff prior to patient discharge.    Final Clinical Impressions / Urgent Care Diagnoses:   Final diagnoses:  Acute non-recurrent sinusitis, unspecified location    New Prescriptions:  [place] Controlled Substance Registry consulted? Not Applicable  Meds ordered this encounter  Medications  . amoxicillin-clavulanate (AUGMENTIN) 875-125 MG tablet    Sig: Take 1 tablet by mouth 2 (two) times daily for 7 days.    Dispense:  14 tablet    Refill:  0  .  cetirizine-pseudoephedrine (ZYRTEC-D) 5-120 MG tablet    Sig: Take 1 tablet by mouth daily.    Dispense:  30 tablet    Refill:  0    Recommended Follow up Care:  Patient encouraged to follow up with the following provider within the specified time frame, or sooner as dictated by the severity of her symptoms. As always, she was instructed that for any urgent/emergent care needs, she should seek care either here or in the emergency department for more immediate evaluation.  Follow-up Information    Ricardo Jericho, NP In 1 week.   Specialty: Family Medicine Why: General reassessment of symptoms if not improving Contact information: [name] [address] [phone]         NOTE: This note was prepared using Dragon dictation software along with smaller phrase technology. Despite my best ability to proofread, there is the potential that transcriptional errors may still occur from this process, and are completely unintentional.    Karen Kitchens, NP 04/05/19 2022

## 2019-04-12 ENCOUNTER — Other Ambulatory Visit: Payer: Federal, State, Local not specified - PPO

## 2019-04-12 ENCOUNTER — Other Ambulatory Visit: Payer: Self-pay | Admitting: Obstetrics and Gynecology

## 2019-04-12 DIAGNOSIS — N911 Secondary amenorrhea: Secondary | ICD-10-CM | POA: Diagnosis not present

## 2019-04-16 ENCOUNTER — Telehealth: Payer: Self-pay | Admitting: Obstetrics and Gynecology

## 2019-04-16 NOTE — Telephone Encounter (Signed)
Patient is calling for labs results. Please advise. Patient aware AMS is out of the office this week and will return on Monday

## 2019-04-19 DIAGNOSIS — R7303 Prediabetes: Secondary | ICD-10-CM | POA: Diagnosis not present

## 2019-04-19 DIAGNOSIS — F331 Major depressive disorder, recurrent, moderate: Secondary | ICD-10-CM | POA: Diagnosis not present

## 2019-04-19 DIAGNOSIS — I10 Essential (primary) hypertension: Secondary | ICD-10-CM | POA: Diagnosis not present

## 2019-04-19 DIAGNOSIS — R609 Edema, unspecified: Secondary | ICD-10-CM | POA: Diagnosis not present

## 2019-04-19 NOTE — Telephone Encounter (Signed)
Lab results from 4/5 have not resulted not sure if it was at a draw station they read active need to be collected

## 2019-04-21 ENCOUNTER — Telehealth: Payer: Self-pay | Admitting: Obstetrics and Gynecology

## 2019-04-21 ENCOUNTER — Ambulatory Visit: Payer: Federal, State, Local not specified - PPO | Admitting: Obstetrics and Gynecology

## 2019-04-21 ENCOUNTER — Other Ambulatory Visit: Payer: Self-pay | Admitting: Obstetrics and Gynecology

## 2019-04-21 MED ORDER — SLYND 4 MG PO TABS: 1.0000 | ORAL_TABLET | Freq: Every day | ORAL | 11 refills | Status: DC

## 2019-04-21 NOTE — Telephone Encounter (Signed)
Patient was schedule today for a medication follow up. Patient reports she isn't able to reschedule to 04/23/19 am appointment due to work. Patient is aware we were rescheduling due to AMS in surgery but now patient is out of medication and the next available appointment she would be able to reschedule for an in person appointment at the [facility] is May 20, 21. Patient is asking if there is away we can reschedule her to work her in for a 4:30 appointment so she can be seen for a more sooner appointment. Patient reports she is out of her birth control. Can she be scheduled for over the phone or virtual? Please advise

## 2019-04-21 NOTE — Telephone Encounter (Signed)
So we can put her in for a 4:30 video visit anytime next week. Also I sent an Rx for slynd to the pharmacy, she may also come by the office to pick up samples if not covered.  Its ok to do a video visit or phone visit.  I'm actually still waiting on some of her labs results to come back as well

## 2019-04-22 NOTE — Telephone Encounter (Signed)
Called and spoke with patient about prescription being sent to pharmacy. Patient is schedule for Tuesday,04/27/19 for telephone visit with AMS

## 2019-04-23 LAB — TSH+PRL+FSH+TESTT+LH+DHEA S...
17-Hydroxyprogesterone: 18 ng/dL
Androstenedione: 39 ng/dL — ABNORMAL LOW (ref 41–262)
DHEA-SO4: 84.3 ug/dL (ref 57.3–279.2)
FSH: 6.4 m[IU]/mL
LH: 4.3 m[IU]/mL
Prolactin: 30.3 ng/mL — ABNORMAL HIGH (ref 4.8–23.3)
TSH: 0.773 u[IU]/mL (ref 0.450–4.500)
Testosterone, Free: 1 pg/mL (ref 0.0–4.2)
Testosterone: <3 ng/dL — ABNORMAL LOW (ref 8–48)

## 2019-04-23 LAB — ESTRADIOL: Estradiol: 28.6 pg/mL

## 2019-04-23 LAB — BETA HCG QUANT (REF LAB): hCG Quant: <1 m[IU]/mL

## 2019-04-24 DIAGNOSIS — F331 Major depressive disorder, recurrent, moderate: Secondary | ICD-10-CM | POA: Diagnosis not present

## 2019-04-26 DIAGNOSIS — Z20822 Contact with and (suspected) exposure to covid-19: Secondary | ICD-10-CM | POA: Diagnosis not present

## 2019-04-27 ENCOUNTER — Other Ambulatory Visit: Payer: Self-pay

## 2019-04-27 ENCOUNTER — Ambulatory Visit (INDEPENDENT_AMBULATORY_CARE_PROVIDER_SITE_OTHER): Payer: Federal, State, Local not specified - PPO | Admitting: Obstetrics and Gynecology

## 2019-04-27 DIAGNOSIS — N911 Secondary amenorrhea: Secondary | ICD-10-CM | POA: Diagnosis not present

## 2019-04-27 DIAGNOSIS — E221 Hyperprolactinemia: Secondary | ICD-10-CM | POA: Diagnosis not present

## 2019-04-27 NOTE — Progress Notes (Signed)
I connected with [name]  on 05/04/19 at  4:30 PM EDT by telephone and verified that I am speaking with the correct person using two identifiers.   I discussed the limitations, risks, security and privacy concerns of performing an evaluation and management service by telephone and the availability of in person appointments. I also discussed with the patient that there may be a patient responsible charge related to this service. The patient expressed understanding and agreed to proceed.  The patient was at home I spoke with the patient from my workstation phone The names of people involved in this encounter were: [name] , and [facility] Visit   Chief Complaint: No chief complaint on file.   History of Present Illness: 44 y.o. [id number] presenting to discuss follow up of laboratory evaluation from 04/12/2019 which was essential normal other than mild elevation in prolactin.  She continues to have amenorrhea.  No headaches, vision changes, or nipple discharge reported by patient.  Also states that she was unable to tolerate phentermine secondary to worsening PVC's.    Review of Systems: review of systems negative unless noted in HPI  Past Medical History:  Past Medical History:  Diagnosis Date  . Anxiety   . Cervical dysplasia   . Depression   . Family history of ovarian cancer    doesn't meet BCBS Fed genetic testing guidelines.  Marland Kitchen Heart palpitations    Dr. Posey Pronto at [facility]  . Leiomyoma 2014  . Pre-diabetes   . Vitamin D deficiency 11/2015    Past Surgical History:  Past Surgical History:  Procedure Laterality Date  . COLPOSCOPY    . LEEP      Gynecologic History: No LMP recorded.  Obstetric History: [id number]  Family History:  Family History  Problem Relation Age of Onset  . Ovarian cancer Maternal Aunt 21  . Hypertension Father   . Other Mother        fibroids/leiomyoma of uterus  . Breast cancer Cousin   .  Breast cancer Cousin     Social History:  Social History   Socioeconomic History  . Marital status: Married    Spouse name: Not on file  . Number of children: Not on file  . Years of education: Not on file  . Highest education level: Not on file  Occupational History  . Not on file  Tobacco Use  . Smoking status: Former Research scientist (life sciences)  . Smokeless tobacco: Never Used  Substance and Sexual Activity  . Alcohol use: Yes    Comment: occ  . Drug use: No  . Sexual activity: Yes    Birth control/protection: Pill  Other Topics Concern  . Not on file  Social History Narrative  . Not on file   Social Determinants of Health   Financial Resource Strain:   . Difficulty of Paying Living Expenses:   Food Insecurity:   . Worried About Charity fundraiser in the Last Year:   . Arboriculturist in the Last Year:   Transportation Needs:   . Film/video editor (Medical):   Marland Kitchen Lack of Transportation (Non-Medical):   Physical Activity:   . Days of Exercise per Week:   . Minutes of Exercise per Session:   Stress:   . Feeling of Stress :   Social Connections:   . Frequency of Communication with Friends and Family:   . Frequency of Social Gatherings with Friends and Family:   . Attends  Religious Services:   . Active Member of Clubs or Organizations:   . Attends Archivist Meetings:   Marland Kitchen Marital Status:   Intimate Partner Violence:   . Fear of Current or Ex-Partner:   . Emotionally Abused:   Marland Kitchen Physically Abused:   . Sexually Abused:     Allergies:  Allergies  Allergen Reactions  . Bupropion Other (See Comments)    Medications: Prior to Admission medications   Medication Sig Start Date End Date Taking? Authorizing Provider  cetirizine-pseudoephedrine (ZYRTEC-D) 5-120 MG tablet Take 1 tablet by mouth daily. 04/04/19   Karen Kitchens, NP  Cholecalciferol (VITAMIN D3) 2000 units capsule Take 1 capsule by mouth daily.    [provider]  Drospirenone (SLYND) 4 MG TABS  Take 1 tablet by mouth daily. 04/21/19   [name], MD  hydrochlorothiazide (MICROZIDE) 12.5 MG capsule Take by mouth. 01/12/19 01/12/20  [provider]  hydrOXYzine (ATARAX/VISTARIL) 25 MG tablet Take 25 mg by mouth 3 (three) times daily. 03/09/19   [provider]    Physical Exam Vitals: There were no vitals filed for this visit. No LMP recorded.  No physical exam as this was a remote telephone visit to promote social distancing during the current COVID-19 Pandemic   Assessment: 44 y.o. [id number] follow up secondary amenorrhea  Plan: Problem List Items Addressed This Visit    None    Visit Diagnoses    Secondary amenorrhea    -  Primary   Relevant Orders   US Transvaginal Non-OB   Prolactin   Hyperprolactinemia (HCC)       Relevant Orders   Prolactin     1) Amenorrhea - discussed that labs are essentially normal with the exception of mild elevation in Prolactin.  I recommended repeating an AM fasting value. As prior value obtained 09/09/2018 was normal.  Prolactin may be mildly elevated on OCPs or other medications, and after meals. She does not appear to be menopausal based on FSH/LH levels.  Amenorrhea also coincides with switch to [facility].  If prolactin is rising or remains elevated we discussed imaging of the pituitary gland as next step.    2) Medical weight loss - felt that phentermine worsened PVCs.   We discussed contrave as an alternative although this too may increase blood pressure.  Kirke Shaggy is also an options but will like required prior authorization. After consideration of pros and cons patient interested in proceeding with contrave Rx  3) Telephone time 26:20 minutes  [name], MD, [place], [place]

## 2019-04-28 ENCOUNTER — Other Ambulatory Visit: Payer: Federal, State, Local not specified - PPO

## 2019-04-28 DIAGNOSIS — Z20822 Contact with and (suspected) exposure to covid-19: Secondary | ICD-10-CM | POA: Diagnosis not present

## 2019-04-28 DIAGNOSIS — E221 Hyperprolactinemia: Secondary | ICD-10-CM | POA: Diagnosis not present

## 2019-04-28 DIAGNOSIS — N911 Secondary amenorrhea: Secondary | ICD-10-CM | POA: Diagnosis not present

## 2019-05-06 ENCOUNTER — Other Ambulatory Visit: Payer: Self-pay

## 2019-05-06 ENCOUNTER — Ambulatory Visit (INDEPENDENT_AMBULATORY_CARE_PROVIDER_SITE_OTHER): Payer: Federal, State, Local not specified - PPO

## 2019-05-06 ENCOUNTER — Other Ambulatory Visit: Payer: Federal, State, Local not specified - PPO

## 2019-05-06 ENCOUNTER — Ambulatory Visit (INDEPENDENT_AMBULATORY_CARE_PROVIDER_SITE_OTHER): Payer: Federal, State, Local not specified - PPO | Admitting: Obstetrics and Gynecology

## 2019-05-06 ENCOUNTER — Encounter: Payer: Self-pay | Admitting: Obstetrics and Gynecology

## 2019-05-06 VITALS — BP 110/78 | Ht 68.0 in | Wt 204.0 lb

## 2019-05-06 DIAGNOSIS — N911 Secondary amenorrhea: Secondary | ICD-10-CM | POA: Diagnosis not present

## 2019-05-06 DIAGNOSIS — Z6831 Body mass index (BMI) 31.0-31.9, adult: Secondary | ICD-10-CM

## 2019-05-06 DIAGNOSIS — E669 Obesity, unspecified: Secondary | ICD-10-CM

## 2019-05-06 MED ORDER — CONTRAVE 8-90 MG PO TB12: ORAL_TABLET | ORAL | 0 refills | Status: DC

## 2019-05-06 NOTE — Progress Notes (Signed)
Gynecology Ultrasound Follow Up  Chief Complaint:  Chief Complaint  Patient presents with  . Amenorrhea     History of Present Illness: Patient is a 44 y.o. female who presents today for ultrasound evaluation of secondary amenorrhea.  Ultrasound demonstrates the following findgins Adnexa: normal appearance Uterus: Enlarged with at least three uterine fibroids, the largest being subserosal and anterior measuring 62.5 x 53.3 x 59.57mm with endometrial stripe not clearly visualized Additional: no free fluid  Review of Systems: Review of Systems  Constitutional: Negative.   Gastrointestinal: Negative.   Genitourinary: Negative.     Past Medical History:  Past Medical History:  Diagnosis Date  . Anxiety   . Cervical dysplasia   . Depression   . Family history of ovarian cancer    doesn't meet BCBS Fed genetic testing guidelines.  Marland Kitchen Heart palpitations    Dr. Posey Pronto at [facility]  . Leiomyoma 2014  . Pre-diabetes   . Vitamin D deficiency 11/2015    Past Surgical History:  Past Surgical History:  Procedure Laterality Date  . COLPOSCOPY    . LEEP      Gynecologic History:  Patient's last menstrual period was 01/08/2019 (approximate).  Family History:  Family History  Problem Relation Age of Onset  . Ovarian cancer Maternal Aunt 33  . Hypertension Father   . Other Mother        fibroids/leiomyoma of uterus  . Breast cancer Cousin   . Breast cancer Cousin     Social History:  Social History   Socioeconomic History  . Marital status: Married    Spouse name: Not on file  . Number of children: Not on file  . Years of education: Not on file  . Highest education level: Not on file  Occupational History  . Not on file  Tobacco Use  . Smoking status: Former Research scientist (life sciences)  . Smokeless tobacco: Never Used  Substance and Sexual Activity  . Alcohol use: Yes    Comment: occ  . Drug use: No  . Sexual activity: Yes    Birth control/protection: Pill  Other Topics Concern  .  Not on file  Social History Narrative  . Not on file   Social Determinants of Health   Financial Resource Strain:   . Difficulty of Paying Living Expenses:   Food Insecurity:   . Worried About Charity fundraiser in the Last Year:   . Arboriculturist in the Last Year:   Transportation Needs:   . Film/video editor (Medical):   Marland Kitchen Lack of Transportation (Non-Medical):   Physical Activity:   . Days of Exercise per Week:   . Minutes of Exercise per Session:   Stress:   . Feeling of Stress :   Social Connections:   . Frequency of Communication with Friends and Family:   . Frequency of Social Gatherings with Friends and Family:   . Attends Religious Services:   . Active Member of Clubs or Organizations:   . Attends Archivist Meetings:   Marland Kitchen Marital Status:   Intimate Partner Violence:   . Fear of Current or Ex-Partner:   . Emotionally Abused:   Marland Kitchen Physically Abused:   . Sexually Abused:     Allergies:  Allergies  Allergen Reactions  . Bupropion Other (See Comments)    Medications: Prior to Admission medications   Medication Sig Start Date End Date Taking? Authorizing Provider  cetirizine-pseudoephedrine (ZYRTEC-D) 5-120 MG tablet Take 1 tablet by mouth daily.  04/04/19   Karen Kitchens, NP  Cholecalciferol (VITAMIN D3) 2000 units capsule Take 1 capsule by mouth daily.    [provider]  Drospirenone (SLYND) 4 MG TABS Take 1 tablet by mouth daily. 04/21/19   [name], MD  hydrochlorothiazide (MICROZIDE) 12.5 MG capsule Take by mouth. 01/12/19 01/12/20  [provider]  hydrOXYzine (ATARAX/VISTARIL) 25 MG tablet Take 25 mg by mouth 3 (three) times daily. 03/09/19   [provider]    Physical Exam Vitals: Blood pressure 110/78, height 5\' 8"  (1.727 m), weight 204 lb (92.5 kg), last menstrual period 01/08/2019. Body mass index is 31.02 kg/m.  General: NAD HEENT: normocephalic, anicteric Pulmonary: No increased work of  breathing Extremities: no edema, erythema, or tenderness Neurologic: Grossly intact, normal gait Psychiatric: mood appropriate, affect full  US Transvaginal Non-OB  Result Date: 05/06/2019 Patient Name: Skylor Pinn DOB: [birth year] MRN: [id number] ULTRASOUND REPORT Location: [facility] Date of Service: 05/06/2019 Indications: Irregular periods Findings: The uterus is retroverted and measures 8.1 x 5.8 x 7.1 cm. Echo texture is heterogenous with evidence of focal masses. Within the uterus are multiple suspected fibroids measuring: Fibroid 1: 62.5 x 53.3 x 59.6 mm subserosal anterior fundal Fibroid 2: 19.1 x 18.8 x 18.6 mm intramural right Fibroid 3: 37.6 x 34.4 x 31.7 mm intramural right The Endometrium is not visualized. Right Ovary measures 3.5 x 2.3 x 2.3 cm. It is normal in appearance. Dominant right follicle measures 0000000 x 14.0 x 19.0 mm Left Ovary measures 2.7 x 1.1 x 1.4 cm. It is normal in appearance. Survey of the adnexa demonstrates no adnexal masses. There is no free fluid in the cul de sac. Impression: 1. There are three uterine fibroids seen. 2. The endometrium is not visible. 3. Normal appearing ovaries. Recommendations: 1.Clinical correlation with the patient's History and Physical Exam. Gweneth Dimitri, RT Images reviewed.  Several uterine fibroids the largest being a subserosal on the patient's right.  Endometrium is not well visualized. [name], MD, [facility], [facility] 05/06/2019, 2:21 PM     Assessment: 44 y.o. [id number] follow up secondary amenorrhea  Plan: Problem List Items Addressed This Visit    None    Visit Diagnoses    Secondary amenorrhea    -  Primary   Class 1 obesity with serious comorbidity and body mass index (BMI) of 31.0 to 31.9 in adult, unspecified obesity type       Relevant Medications   Naltrexone-buPROPion HCl ER (CONTRAVE) 8-90 MG TB12      1) Stop slynd see if menses resume as there does appear to be a  temporal relationship as to when she was taken of combination OCP.  Repeat prolacitn pending.    2) Weight loss contrave - allergy to Wellbutrin was headaches.  Discussed that contrave contains the same active ingredient, will monitor but if notes headaches likely related to Wellbutrin  3) Return in about 4 weeks (around 06/03/2019) for if no menses 4 weeks, otherwise 3 months weight check.    [name], MD, [facility], [facility] 05/06/2019, 2:46 PM

## 2019-05-31 ENCOUNTER — Other Ambulatory Visit: Payer: Self-pay | Admitting: Obstetrics and Gynecology

## 2019-06-01 NOTE — Telephone Encounter (Signed)
Pt is aware she will need an appointment. Please see mychart msg.

## 2019-06-02 NOTE — Telephone Encounter (Signed)
Patient is schedule for telephone visit 06/03/19 at 8 am

## 2019-06-02 NOTE — Telephone Encounter (Signed)
Needs phone visit can be tomorrow

## 2019-06-03 ENCOUNTER — Other Ambulatory Visit: Payer: Self-pay

## 2019-06-03 ENCOUNTER — Ambulatory Visit (INDEPENDENT_AMBULATORY_CARE_PROVIDER_SITE_OTHER): Payer: Federal, State, Local not specified - PPO | Admitting: Obstetrics and Gynecology

## 2019-06-03 ENCOUNTER — Encounter: Payer: Self-pay | Admitting: Obstetrics and Gynecology

## 2019-06-03 VITALS — Ht 67.0 in | Wt 198.0 lb

## 2019-06-03 DIAGNOSIS — E669 Obesity, unspecified: Secondary | ICD-10-CM

## 2019-06-03 DIAGNOSIS — Z6831 Body mass index (BMI) 31.0-31.9, adult: Secondary | ICD-10-CM

## 2019-06-03 MED ORDER — PHENTERMINE HCL 37.5 MG PO TABS: 37.5000 mg | ORAL_TABLET | Freq: Every day | ORAL | 0 refills | Status: DC

## 2019-06-03 NOTE — Progress Notes (Signed)
I connected with Donna Zuniga on 06/03/19 at  8:10 AM EDT by telephone and verified that I am speaking with the correct person using two identifiers.   I discussed the limitations, risks, security and privacy concerns of performing an evaluation and management service by telephone and the availability of in person appointments. I also discussed with the patient that there may be a patient responsible charge related to this service. The patient expressed understanding and agreed to proceed.  The patient was at work I spoke with the patient from my workstation phone The names of people involved in this encounter were: Donna Zuniga , and Donna Zuniga   Gynecology Office Visit  Chief Complaint:  Chief Complaint  Patient presents with  . Weight Check    History of Present Illness: Patientis a 44 y.o. [id number] female, who presents for the evaluation of the desire to lose weight. She has lost 6 pounds 2  weeks. The patient states the following symptoms since starting her weight loss therapy: appetite suppression, energy, and weight loss.  The patient also reports no other ill effects. The patient specifically denies heart palpitations, anxiety, and insomnia.    Review of Systems: 10 point review of systems negative unless otherwise noted in HPI  Past Medical History:  Past Medical History:  Diagnosis Date  . Anxiety   . Cervical dysplasia   . Depression   . Family history of ovarian cancer    doesn't meet BCBS Fed genetic testing guidelines.  Marland Kitchen Heart palpitations    Dr. Posey Pronto at [facility]  . Leiomyoma 2014  . Pre-diabetes   . Vitamin D deficiency 11/2015    Past Surgical History:  Past Surgical History:  Procedure Laterality Date  . COLPOSCOPY    . LEEP      Gynecologic History: No LMP recorded.  Obstetric History: [id number]  Family History:  Family History  Problem Relation Age of Onset  . Ovarian cancer Maternal Aunt 21  . Hypertension Father   . Other  Mother        fibroids/leiomyoma of uterus  . Breast cancer Cousin   . Breast cancer Cousin     Social History:  Social History   Socioeconomic History  . Marital status: Married    Spouse name: Not on file  . Number of children: Not on file  . Years of education: Not on file  . Highest education level: Not on file  Occupational History  . Not on file  Tobacco Use  . Smoking status: Former Research scientist (life sciences)  . Smokeless tobacco: Never Used  Substance and Sexual Activity  . Alcohol use: Yes    Comment: occ  . Drug use: No  . Sexual activity: Yes    Birth control/protection: Pill  Other Topics Concern  . Not on file  Social History Narrative  . Not on file   Social Determinants of Health   Financial Resource Strain:   . Difficulty of Paying Living Expenses:   Food Insecurity:   . Worried About Charity fundraiser in the Last Year:   . Arboriculturist in the Last Year:   Transportation Needs:   . Film/video editor (Medical):   Marland Kitchen Lack of Transportation (Non-Medical):   Physical Activity:   . Days of Exercise per Week:   . Minutes of Exercise per Session:   Stress:   . Feeling of Stress :   Social Connections:   . Frequency of Communication with Friends and Family:   .  Frequency of Social Gatherings with Friends and Family:   . Attends Religious Services:   . Active Member of Clubs or Organizations:   . Attends Archivist Meetings:   Marland Kitchen Marital Status:   Intimate Partner Violence:   . Fear of Current or Ex-Partner:   . Emotionally Abused:   Marland Kitchen Physically Abused:   . Sexually Abused:     Allergies:  Allergies  Allergen Reactions  . Bupropion Other (See Comments)    Medications: Prior to Admission medications   Medication Sig Start Date End Date Taking? Authorizing Provider  Cholecalciferol (VITAMIN D3) 2000 units capsule Take 1 capsule by mouth daily.   Yes [provider]  hydrochlorothiazide (MICROZIDE) 12.5 MG capsule Take by mouth. 01/12/19  01/12/20 Yes [provider]  hydrOXYzine (ATARAX/VISTARIL) 25 MG tablet Take 25 mg by mouth 3 (three) times daily. 03/09/19  Yes [provider]    Physical Exam Height 5\' 7"  (1.702 m), weight 198 lb (89.8 kg). Wt Readings from Last 3 Encounters:  06/03/19 198 lb (89.8 kg)  05/06/19 204 lb (92.5 kg)  04/04/19 202 lb (91.6 kg)  Body mass index is 31.01 kg/m.  No physical exam as this was a remote telephone visit to promote social distancing during the current COVID-19 Pandemic  Assessment: 44 y.o. [id number]   Plan: Problem List Items Addressed This Visit    None    Visit Diagnoses    Class 1 obesity with serious comorbidity and body mass index (BMI) of 31.0 to 31.9 in adult, unspecified obesity type    -  Primary   Relevant Medications   phentermine (ADIPEX-P) 37.5 MG tablet      1) 1500 Calorie ADA Diet  2) Patient education given regarding appropriate lifestyle changes for weight loss including: regular physical activity, healthy coping strategies, caloric restriction and healthy eating patterns.  3) Still has not had a menstrual cycle since discontinuing Slynd.  If no menses in the next month discussed endometrial biopsy.  Could also consider AMH and repeat FHS/estradiol  4) Patient to take medication, with the benefits of appetite suppression and metabolism boost d/w pt, along with the side effects and risk factors of long term use that will be avoided with our use of short bursts of therapy. Rx provided.    5) Telephone Time 8:36 min  6)  Return in about 4 weeks (around 07/01/2019) for medication follow up (phone).    Donna Mood, MD, [facility], [facility] 06/03/2019, 8:25 AM

## 2019-06-29 DIAGNOSIS — Z23 Encounter for immunization: Secondary | ICD-10-CM | POA: Diagnosis not present

## 2019-07-07 ENCOUNTER — Ambulatory Visit: Payer: Federal, State, Local not specified - PPO | Admitting: Obstetrics and Gynecology

## 2019-07-15 ENCOUNTER — Telehealth: Payer: Self-pay | Admitting: Obstetrics and Gynecology

## 2019-07-15 NOTE — Telephone Encounter (Signed)
Patient is calling to speak with Donna Zuniga about her breast pain and to go over some concerns she is having. Please advise

## 2019-07-15 NOTE — Telephone Encounter (Signed)
Pt having bilat breast tenderness past 2 wks, no masses. Pt was on slynd with amenorrhea (and neg lab eval), so stopped slynd and menses resumed normally. Pt restarted slynd about 2 wks ago. Also on phentermine, no other caffeine use. Pt concerned about breast sx. Hasn't has mammo because too scared to wait for results.  Suggested pt wait to see if breast sx improve with new pill pack in 2 wks. If so, sched scr mammo. If sx persist, RTO for breast eval. More than likely due to hormonal changes. F/u prn.

## 2019-07-20 DIAGNOSIS — Z23 Encounter for immunization: Secondary | ICD-10-CM | POA: Diagnosis not present

## 2019-07-21 ENCOUNTER — Encounter: Payer: Self-pay | Admitting: Obstetrics and Gynecology

## 2019-07-21 ENCOUNTER — Other Ambulatory Visit: Payer: Self-pay

## 2019-07-21 ENCOUNTER — Ambulatory Visit (INDEPENDENT_AMBULATORY_CARE_PROVIDER_SITE_OTHER): Payer: Federal, State, Local not specified - PPO | Admitting: Obstetrics and Gynecology

## 2019-07-21 VITALS — Ht 67.0 in | Wt 191.0 lb

## 2019-07-21 DIAGNOSIS — E663 Overweight: Secondary | ICD-10-CM

## 2019-07-21 DIAGNOSIS — Z6829 Body mass index (BMI) 29.0-29.9, adult: Secondary | ICD-10-CM

## 2019-07-21 MED ORDER — PHENTERMINE HCL 37.5 MG PO TABS: 37.5000 mg | ORAL_TABLET | Freq: Every day | ORAL | 0 refills | Status: DC

## 2019-07-21 NOTE — Progress Notes (Addendum)
I connected with Donna Zuniga on 07/21/2019 at  4:30 PM EDT by telephone and verified that I am speaking with the correct person using two identifiers.   I discussed the limitations, risks, security and privacy concerns of performing an evaluation and management service by telephone and the availability of in person appointments. I also discussed with the patient that there may be a patient responsible charge related to this service. The patient expressed understanding and agreed to proceed.  The patient was at home I spoke with the patient from my workstation phone The names of people involved in this encounter were: Donna Zuniga , and Donna Zuniga   Gynecology Office Visit  Chief Complaint:  Chief Complaint  Patient presents with  . Weight Check    History of Present Illness: Patientis a 44 y.o. [id number] female, who presents for the evaluation of the desire to lose weight. She has lost 7 pounds 1 months. The patient states the following symptoms since starting her weight loss therapy: appetite suppression, energy, and weight loss.  The patient also reports no other ill effects. The patient specifically denies heart palpitations, anxiety, and insomnia.    Review of Systems: 10 point review of systems negative unless otherwise noted in HPI  Past Medical History:  Past Medical History:  Diagnosis Date  . Anxiety   . Cervical dysplasia   . Depression   . Family history of ovarian cancer    doesn't meet BCBS Fed genetic testing guidelines.  Marland Kitchen Heart palpitations    Dr. Posey Pronto at [facility]  . Leiomyoma 2014  . Pre-diabetes   . Vitamin D deficiency 11/2015    Past Surgical History:  Past Surgical History:  Procedure Laterality Date  . COLPOSCOPY    . LEEP      Gynecologic History: Patient's last menstrual period was 06/30/2019.  Obstetric History: [id number]  Family History:  Family History  Problem Relation Age of Onset  . Ovarian cancer Maternal Aunt 6  .  Hypertension Father   . Other Mother        fibroids/leiomyoma of uterus  . Breast cancer Cousin   . Breast cancer Cousin     Social History:  Social History   Socioeconomic History  . Marital status: Married    Spouse name: Not on file  . Number of children: Not on file  . Years of education: Not on file  . Highest education level: Not on file  Occupational History  . Not on file  Tobacco Use  . Smoking status: Former Research scientist (life sciences)  . Smokeless tobacco: Never Used  Vaping Use  . Vaping Use: Never used  Substance and Sexual Activity  . Alcohol use: Yes    Comment: occ  . Drug use: No  . Sexual activity: Yes    Birth control/protection: Pill  Other Topics Concern  . Not on file  Social History Narrative  . Not on file   Social Determinants of Health   Financial Resource Strain:   . Difficulty of Paying Living Expenses:   Food Insecurity:   . Worried About Charity fundraiser in the Last Year:   . Arboriculturist in the Last Year:   Transportation Needs:   . Film/video editor (Medical):   Marland Kitchen Lack of Transportation (Non-Medical):   Physical Activity:   . Days of Exercise per Week:   . Minutes of Exercise per Session:   Stress:   . Feeling of Stress :   Social Connections:   .  Frequency of Communication with Friends and Family:   . Frequency of Social Gatherings with Friends and Family:   . Attends Religious Services:   . Active Member of Clubs or Organizations:   . Attends Archivist Meetings:   Marland Kitchen Marital Status:   Intimate Partner Violence:   . Fear of Current or Ex-Partner:   . Emotionally Abused:   Marland Kitchen Physically Abused:   . Sexually Abused:     Allergies:  Allergies  Allergen Reactions  . Bupropion Other (See Comments)    Medications: Prior to Admission medications   Medication Sig Start Date End Date Taking? Authorizing Provider  Cholecalciferol (VITAMIN D3) 2000 units capsule Take 1 capsule by mouth daily.   Yes [provider]   hydrochlorothiazide (MICROZIDE) 12.5 MG capsule Take by mouth. 01/12/19 01/12/20 Yes [provider]  hydrOXYzine (ATARAX/VISTARIL) 25 MG tablet Take 25 mg by mouth 3 (three) times daily. 03/09/19  Yes [provider]  phentermine (ADIPEX-P) 37.5 MG tablet Take 1 tablet (37.5 mg total) by mouth daily before breakfast. 06/03/19  Yes Donna Mood, MD    Physical Exam Height 5\' 7"  (1.702 m), weight 191 lb (86.6 kg), last menstrual period 06/30/2019. Wt Readings from Last 3 Encounters:  07/21/19 191 lb (86.6 kg)  06/03/19 198 lb (89.8 kg)  05/06/19 204 lb (92.5 kg)  Body mass index is 29.91 kg/m.  No physical exam as this was a remote telephone visit to promote social distancing during the current COVID-19 Pandemic   Assessment: 44 y.o. [id number] medical weight loss follow up  Plan: Problem List Items Addressed This Visit    None      1) 1500 Calorie ADA Diet  2) Patient education given regarding appropriate lifestyle changes for weight loss including: regular physical activity, healthy coping strategies, caloric restriction and healthy eating patterns.  3) Patient to take medication, with the benefits of appetite suppression and metabolism boost d/w pt, along with the side effects and risk factors of long term use that will be avoided with our use of short bursts of therapy. Rx provided.    4) Telephone Time 12:20min  5)  Return in about 4 weeks (around 08/18/2019) for medication follow up in person morning appointment please.    Donna Mood, MD, [facility], [facility] 07/21/2019, 4:54 PM

## 2019-07-22 DIAGNOSIS — Z20822 Contact with and (suspected) exposure to covid-19: Secondary | ICD-10-CM | POA: Diagnosis not present

## 2019-08-16 DIAGNOSIS — F331 Major depressive disorder, recurrent, moderate: Secondary | ICD-10-CM | POA: Diagnosis not present

## 2019-08-26 ENCOUNTER — Other Ambulatory Visit: Payer: Self-pay | Admitting: Obstetrics and Gynecology

## 2019-09-01 ENCOUNTER — Ambulatory Visit: Payer: Federal, State, Local not specified - PPO | Admitting: Obstetrics and Gynecology

## 2019-09-09 ENCOUNTER — Ambulatory Visit (INDEPENDENT_AMBULATORY_CARE_PROVIDER_SITE_OTHER): Payer: Federal, State, Local not specified - PPO | Admitting: Obstetrics and Gynecology

## 2019-09-09 ENCOUNTER — Ambulatory Visit: Payer: Federal, State, Local not specified - PPO | Admitting: Obstetrics and Gynecology

## 2019-09-09 ENCOUNTER — Encounter: Payer: Self-pay | Admitting: Obstetrics and Gynecology

## 2019-09-09 ENCOUNTER — Other Ambulatory Visit: Payer: Self-pay

## 2019-09-09 VITALS — BP 140/87 | Ht 68.0 in | Wt 185.0 lb

## 2019-09-09 DIAGNOSIS — E8881 Metabolic syndrome: Secondary | ICD-10-CM

## 2019-09-09 DIAGNOSIS — Z6828 Body mass index (BMI) 28.0-28.9, adult: Secondary | ICD-10-CM | POA: Diagnosis not present

## 2019-09-09 DIAGNOSIS — E663 Overweight: Secondary | ICD-10-CM | POA: Diagnosis not present

## 2019-09-09 MED ORDER — PHENTERMINE HCL 37.5 MG PO TABS: 37.5000 mg | ORAL_TABLET | Freq: Every day | ORAL | 0 refills | Status: DC

## 2019-09-09 NOTE — Progress Notes (Signed)
Gynecology Office Visit  Chief Complaint:  Chief Complaint  Patient presents with  . Weight Check  . Metrorrhagia    History of Present Illness: Patientis a 44 y.o. L4T6256 female, who presents for the evaluation of the desire to lose weight. She has lost 6 pounds 1 months. The patient states the following symptoms since starting her weight loss therapy: appetite suppression, energy, and weight loss.  The patient also reports no other ill effects. The patient specifically denies heart palpitations, anxiety, and insomnia.   Does report some breakthrough bleeding which has started on Slynd.   Review of Systems: 10 point review of systems negative unless otherwise noted in HPI  Past Medical History:  Past Medical History:  Diagnosis Date  . Anxiety   . Cervical dysplasia   . Depression   . Family history of ovarian cancer    doesn't meet BCBS Fed genetic testing guidelines.  Marland Kitchen Heart palpitations    Dr. Posey Pronto at [facility]  . Leiomyoma 2014  . Pre-diabetes   . Vitamin D deficiency 11/2015    Past Surgical History:  Past Surgical History:  Procedure Laterality Date  . COLPOSCOPY    . LEEP      Gynecologic History: No LMP recorded.  Obstetric History: [id number]  Family History:  Family History  Problem Relation Age of Onset  . Ovarian cancer Maternal Aunt 23  . Hypertension Father   . Other Mother        fibroids/leiomyoma of uterus  . Breast cancer Cousin   . Breast cancer Cousin     Social History:  Social History   Socioeconomic History  . Marital status: Married    Spouse name: Not on file  . Number of children: Not on file  . Years of education: Not on file  . Highest education level: Not on file  Occupational History  . Not on file  Tobacco Use  . Smoking status: Former Research scientist (life sciences)  . Smokeless tobacco: Never Used  Vaping Use  . Vaping Use: Never used  Substance and Sexual Activity  . Alcohol use: Yes    Comment: occ  . Drug use: No  . Sexual  activity: Yes    Birth control/protection: Pill  Other Topics Concern  . Not on file  Social History Narrative  . Not on file   Social Determinants of Health   Financial Resource Strain:   . Difficulty of Paying Living Expenses: Not on file  Food Insecurity:   . Worried About Charity fundraiser in the Last Year: Not on file  . Ran Out of Food in the Last Year: Not on file  Transportation Needs:   . Lack of Transportation (Medical): Not on file  . Lack of Transportation (Non-Medical): Not on file  Physical Activity:   . Days of Exercise per Week: Not on file  . Minutes of Exercise per Session: Not on file  Stress:   . Feeling of Stress : Not on file  Social Connections:   . Frequency of Communication with Friends and Family: Not on file  . Frequency of Social Gatherings with Friends and Family: Not on file  . Attends Religious Services: Not on file  . Active Member of Clubs or Organizations: Not on file  . Attends Archivist Meetings: Not on file  . Marital Status: Not on file  Intimate Partner Violence:   . Fear of Current or Ex-Partner: Not on file  . Emotionally Abused: Not on file  .  Physically Abused: Not on file  . Sexually Abused: Not on file    Allergies:  Allergies  Allergen Reactions  . Bupropion Other (See Comments)    Medications: Prior to Admission medications   Medication Sig Start Date End Date Taking? Authorizing Provider  Cholecalciferol (VITAMIN D3) 2000 units capsule Take 1 capsule by mouth daily.   Yes [provider]  hydrochlorothiazide (MICROZIDE) 12.5 MG capsule Take by mouth. 01/12/19 01/12/20 Yes [provider]  hydrOXYzine (ATARAX/VISTARIL) 25 MG tablet Take 25 mg by mouth 3 (three) times daily. 03/09/19  Yes [provider]  phentermine (ADIPEX-P) 37.5 MG tablet Take 1 tablet (37.5 mg total) by mouth daily before breakfast. 07/21/19  Yes [name], MD    Physical Exam Blood pressure 140/87, height  5\' 8"  (1.727 m), weight 185 lb (83.9 kg). Wt Readings from Last 3 Encounters:  09/09/19 185 lb (83.9 kg)  07/21/19 191 lb (86.6 kg)  06/03/19 198 lb (89.8 kg)  Body mass index is 28.13 kg/m.   General: NAD HEENT: normocephalic, anicteric Thyroid: no enlargement Pulmonary: no increased work of breathing Neurologic: Grossly intact Psychiatric: mood appropriate, affect full  Assessment: 44 y.o. [id number] follow up medical weight loss  Plan: Problem List Items Addressed This Visit      Other   Overweight (BMI 25.0-29.9) - Primary   Relevant Orders   Hemoglobin A1c    Other Visit Diagnoses    BMI 28.0-28.9,adult       Relevant Orders   Hemoglobin A1c   Insulin resistance       Relevant Orders   Hemoglobin A1c      1) 1500 Calorie ADA Diet  2) Patient education given regarding appropriate lifestyle changes for weight loss including: regular physical activity, healthy coping strategies, caloric restriction and healthy eating patterns.  3) Patient will be started on weight loss medication. The risks and benefits and side effects of medication, such as Adipex (Phenteramine) ,  Tenuate (Diethylproprion), Belviq (lorcarsin), Contrave (buproprion/naltrexone), Qsymia (phentermine/topiramate), and Saxenda (liraglutide) is discussed. The pros and cons of suppressing appetite and boosting metabolism is discussed. Risks of tolerence and addiction is discussed for selected agents discussed. Use of medicine will ne short term, such as 3-4 months at a time followed by a period of time off of the medicine to avoid these risks and side effects for Adipex, Qsymia, and Tenuate discussed. Pt to call with any negative side effects and agrees to keep follow up appts.  4) Patient to take medication, with the benefits of appetite suppression and metabolism boost d/w pt, along with the side effects and risk factors of long term use that will be avoided with our use of short bursts of therapy. Rx provided.      5) 15 minutes face-to-face; with counseling/coordination of care > 50 percent of visit related to obesity and ongoing management/treatment   6)  Breakthrough bleeding - double up on slynd for 5 days to see if spotting stops  7) Return in about 4 weeks (around 10/07/2019) for wt check in person.   [name], MD, [facility], [facility] 09/09/2019, 4:33 PM

## 2019-09-17 ENCOUNTER — Telehealth: Payer: Self-pay

## 2019-09-17 NOTE — Telephone Encounter (Signed)
Patient seen 09/09/19 by AMS for continuous bleeding. She was advised to double up on birth control for 5 days. It did work for 2-3 days and now she's having bleeding again (not heavy). Inquiring if AMS can send in another type of rx to try to stop it all together. She is leaving on Monday. [phone]

## 2019-09-30 NOTE — Telephone Encounter (Signed)
Patient calling back in regards to message that was left on 09/10 would like to know if meds are going to be called in.

## 2019-10-04 ENCOUNTER — Other Ambulatory Visit: Payer: Self-pay | Admitting: Obstetrics and Gynecology

## 2019-10-04 MED ORDER — NORETHINDRONE ACETATE 5 MG PO TABS: 5.0000 mg | ORAL_TABLET | Freq: Every day | ORAL | 11 refills | Status: DC

## 2019-10-04 NOTE — Telephone Encounter (Signed)
Patient will call back to be scheduled for appointment

## 2019-10-04 NOTE — Telephone Encounter (Signed)
Endometrial biopsy in the next 1-3 weeks

## 2019-10-06 ENCOUNTER — Ambulatory Visit (INDEPENDENT_AMBULATORY_CARE_PROVIDER_SITE_OTHER): Payer: Federal, State, Local not specified - PPO | Admitting: Obstetrics and Gynecology

## 2019-10-06 ENCOUNTER — Encounter: Payer: Self-pay | Admitting: Obstetrics and Gynecology

## 2019-10-06 ENCOUNTER — Other Ambulatory Visit: Payer: Self-pay

## 2019-10-06 VITALS — BP 138/98 | Wt 192.0 lb

## 2019-10-06 DIAGNOSIS — D259 Leiomyoma of uterus, unspecified: Secondary | ICD-10-CM | POA: Diagnosis not present

## 2019-10-06 DIAGNOSIS — N939 Abnormal uterine and vaginal bleeding, unspecified: Secondary | ICD-10-CM | POA: Diagnosis not present

## 2019-10-06 DIAGNOSIS — E663 Overweight: Secondary | ICD-10-CM | POA: Diagnosis not present

## 2019-10-06 DIAGNOSIS — E8881 Metabolic syndrome: Secondary | ICD-10-CM | POA: Diagnosis not present

## 2019-10-06 DIAGNOSIS — Z6828 Body mass index (BMI) 28.0-28.9, adult: Secondary | ICD-10-CM | POA: Diagnosis not present

## 2019-10-06 MED ORDER — PHENTERMINE HCL 37.5 MG PO TABS: 37.5000 mg | ORAL_TABLET | Freq: Every day | ORAL | 0 refills | Status: DC

## 2019-10-06 NOTE — Progress Notes (Signed)
Gynecology Office Visit  Chief Complaint:  Chief Complaint  Patient presents with  . Follow-up    Discuss uterine ablation    History of Present Illness:      Obstetrics & Gynecology Office Visit   Chief Complaint:  Chief Complaint  Patient presents with  . Follow-up    Discuss uterine ablation    History of Present Illness: 44 y.o. [id number] presenting for discussion of additional management option for a diagnosis of AUB.  She is currently being managed with norethindrone.   The patient reports improvement in symptoms but continued breakthrough bleeding.  On her current medication regimen has intermittent breakthrough bleeding.   She has not noted any side-effects or new symptoms.    Review of Systems: 10 point review of systems negative unless otherwise noted in HPI  Past Medical History:  Past Medical History:  Diagnosis Date  . Anxiety   . Cervical dysplasia   . Depression   . Family history of ovarian cancer    doesn't meet BCBS Fed genetic testing guidelines.  Marland Kitchen Heart palpitations    Dr. Posey Pronto at [facility]  . Leiomyoma 2014  . Pre-diabetes   . Vitamin D deficiency 11/2015    Past Surgical History:  Past Surgical History:  Procedure Laterality Date  . COLPOSCOPY    . LEEP      Gynecologic History: No LMP recorded.  Obstetric History: [id number]  Family History:  Family History  Problem Relation Age of Onset  . Ovarian cancer Maternal Aunt 86  . Hypertension Father   . Other Mother        fibroids/leiomyoma of uterus  . Breast cancer Cousin   . Breast cancer Cousin     Social History:  Social History   Socioeconomic History  . Marital status: Married    Spouse name: Not on file  . Number of children: Not on file  . Years of education: Not on file  . Highest education level: Not on file  Occupational History  . Not on file  Tobacco Use  . Smoking status: Former Research scientist (life sciences)  . Smokeless tobacco: Never Used  Vaping Use  . Vaping Use: Never used    Substance and Sexual Activity  . Alcohol use: Yes    Comment: occ  . Drug use: No  . Sexual activity: Yes    Birth control/protection: Pill  Other Topics Concern  . Not on file  Social History Narrative  . Not on file   Social Determinants of Health   Financial Resource Strain:   . Difficulty of Paying Living Expenses: Not on file  Food Insecurity:   . Worried About Charity fundraiser in the Last Year: Not on file  . Ran Out of Food in the Last Year: Not on file  Transportation Needs:   . Lack of Transportation (Medical): Not on file  . Lack of Transportation (Non-Medical): Not on file  Physical Activity:   . Days of Exercise per Week: Not on file  . Minutes of Exercise per Session: Not on file  Stress:   . Feeling of Stress : Not on file  Social Connections:   . Frequency of Communication with Friends and Family: Not on file  . Frequency of Social Gatherings with Friends and Family: Not on file  . Attends Religious Services: Not on file  . Active Member of Clubs or Organizations: Not on file  . Attends Archivist Meetings: Not on file  . Marital Status:  Not on file  Intimate Partner Violence:   . Fear of Current or Ex-Partner: Not on file  . Emotionally Abused: Not on file  . Physically Abused: Not on file  . Sexually Abused: Not on file    Allergies:  Allergies  Allergen Reactions  . Bupropion Other (See Comments)    Medications: Prior to Admission medications   Medication Sig Start Date End Date Taking? Authorizing Provider  Cholecalciferol (VITAMIN D3) 2000 units capsule Take 1 capsule by mouth daily.   Yes [provider]  hydrochlorothiazide (MICROZIDE) 12.5 MG capsule Take by mouth. 01/12/19 01/12/20 Yes [provider]  hydrOXYzine (ATARAX/VISTARIL) 25 MG tablet Take 25 mg by mouth 3 (three) times daily. 03/09/19  Yes [provider]  norethindrone (AYGESTIN) 5 MG tablet Take 1 tablet (5 mg total) by mouth daily. 10/04/19   Yes [name], MD  phentermine (ADIPEX-P) 37.5 MG tablet Take 1 tablet (37.5 mg total) by mouth daily before breakfast. 09/09/19  Yes [name], MD    Physical Exam Blood pressure (!) 138/98, weight 192 lb (87.1 kg). Wt Readings from Last 3 Encounters:  10/19/19 188 lb (85.3 kg)  10/06/19 192 lb (87.1 kg)  09/09/19 185 lb (83.9 kg)   Body mass index is 29.19 kg/m.  General: NAD HEENT: normocephalic, anicteric Thyroid: no enlargement Pulmonary: no increased work of breathing Neurologic: Grossly intact Psychiatric: mood appropriate, affect full  Assessment: 44 y.o. [id number] follow up discussion of UA Plan: Problem List Items Addressed This Visit    None    Visit Diagnoses    Abnormal uterine bleeding (AUB)    -  Primary   Uterine leiomyoma, unspecified location          1) AUB - stopped bleeding on norethindrone continue for next month, discussed [place] vs hysterectomy vs Mirena IUD , given age >45 would obtain endometrial biopsy next visit.  She is intrested in [place]  2) 15 minutes face-to-face; with counseling/coordination of care > 50 percent of visit related to obesity and ongoing management/treatment   3) Return in about 1 month (around 11/05/2019) for medication follow up endometrial biopsy.    [name], MD, [facility], [facility] 10/06/2019, 4:04 PM

## 2019-10-07 LAB — HEMOGLOBIN A1C
Est. average glucose Bld gHb Est-mCnc: 114 mg/dL
Hgb A1c MFr Bld: 5.6 % (ref 4.8–5.6)

## 2019-10-19 ENCOUNTER — Other Ambulatory Visit: Payer: Self-pay

## 2019-10-19 ENCOUNTER — Telehealth: Payer: Self-pay | Admitting: Obstetrics and Gynecology

## 2019-10-19 ENCOUNTER — Emergency Department
Admission: EM | Admit: 2019-10-19 | Discharge: 2019-10-19 | Disposition: A | Discharge status: Home / Self Care | Payer: Federal, State, Local not specified - PPO | Attending: Emergency Medicine | Admitting: Emergency Medicine

## 2019-10-19 ENCOUNTER — Emergency Department: Payer: Federal, State, Local not specified - PPO

## 2019-10-19 DIAGNOSIS — Z87891 Personal history of nicotine dependence: Secondary | ICD-10-CM | POA: Diagnosis not present

## 2019-10-19 DIAGNOSIS — N939 Abnormal uterine and vaginal bleeding, unspecified: Secondary | ICD-10-CM | POA: Diagnosis not present

## 2019-10-19 DIAGNOSIS — R2242 Localized swelling, mass and lump, left lower limb: Secondary | ICD-10-CM | POA: Diagnosis not present

## 2019-10-19 DIAGNOSIS — R220 Localized swelling, mass and lump, head: Secondary | ICD-10-CM | POA: Diagnosis not present

## 2019-10-19 DIAGNOSIS — D259 Leiomyoma of uterus, unspecified: Secondary | ICD-10-CM | POA: Diagnosis not present

## 2019-10-19 LAB — HEMOGLOBIN AND HEMATOCRIT, BLOOD
HCT: 37.4 % (ref 36.0–46.0)
Hemoglobin: 12.4 g/dL (ref 12.0–15.0)

## 2019-10-19 LAB — CBC
HCT: 36.4 % (ref 36.0–46.0)
Hemoglobin: 11.8 g/dL — ABNORMAL LOW (ref 12.0–15.0)
MCH: 27.1 pg (ref 26.0–34.0)
MCHC: 32.4 g/dL (ref 30.0–36.0)
MCV: 83.5 fL (ref 80.0–100.0)
Platelets: 278 10*3/uL (ref 150–400)
RBC: 4.36 MIL/uL (ref 3.87–5.11)
RDW: 14.6 % (ref 11.5–15.5)
WBC: 5.9 10*3/uL (ref 4.0–10.5)
nRBC: 0 % (ref 0.0–0.2)

## 2019-10-19 LAB — BASIC METABOLIC PANEL
Anion gap: 9 (ref 5–15)
BUN: 14 mg/dL (ref 6–20)
CO2: 25 mmol/L (ref 22–32)
Calcium: 9 mg/dL (ref 8.9–10.3)
Chloride: 103 mmol/L (ref 98–111)
Creatinine, Ser: 0.97 mg/dL (ref 0.44–1.00)
GFR, Estimated: >60 mL/min (ref >60)
Glucose, Bld: 90 mg/dL (ref 70–99)
Potassium: 3.6 mmol/L (ref 3.5–5.1)
Sodium: 137 mmol/L (ref 135–145)

## 2019-10-19 LAB — POCT PREGNANCY, URINE: Preg Test, Ur: NEGATIVE

## 2019-10-19 IMAGING — US US PELVIS COMPLETE WITH TRANSVAGINAL
1 series · 13 of 25 positions shown · non-contrast
Comparison: [DATE]

CLINICAL DATA: Vaginal bleeding

EXAM:
TRANSABDOMINAL AND TRANSVAGINAL ULTRASOUND OF PELVIS
TECHNIQUE: Study was performed transabdominally to optimize pelvic field of
view evaluation and transvaginally to optimize internal visceral
architecture evaluation.

[Series 1: us pelvic complete with transvaginal · 13 of 51 slices shown]
[im 1/51]
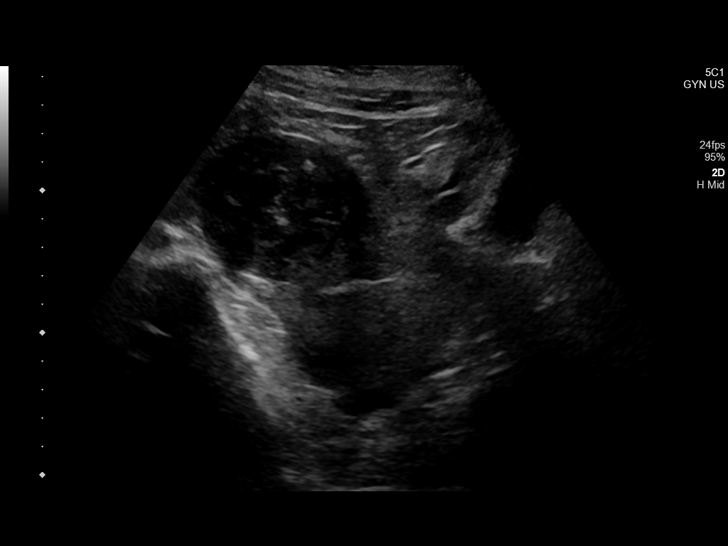
[im 5/51]
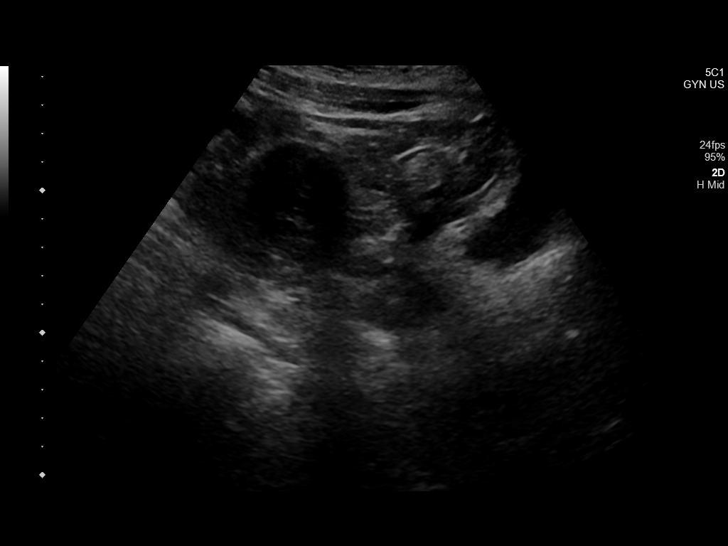
[im 9/51]
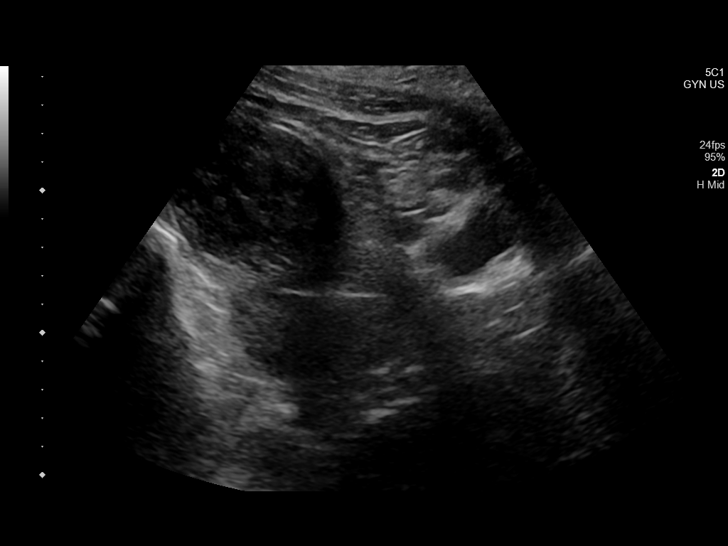
[im 13/51]
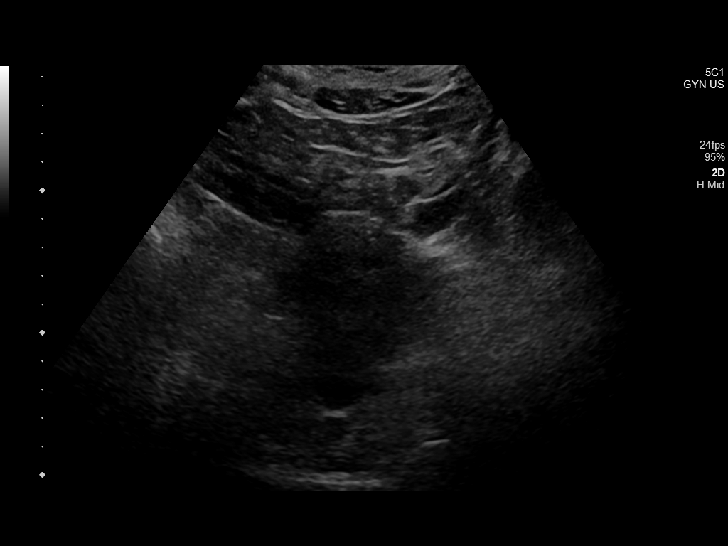
[im 17/51]
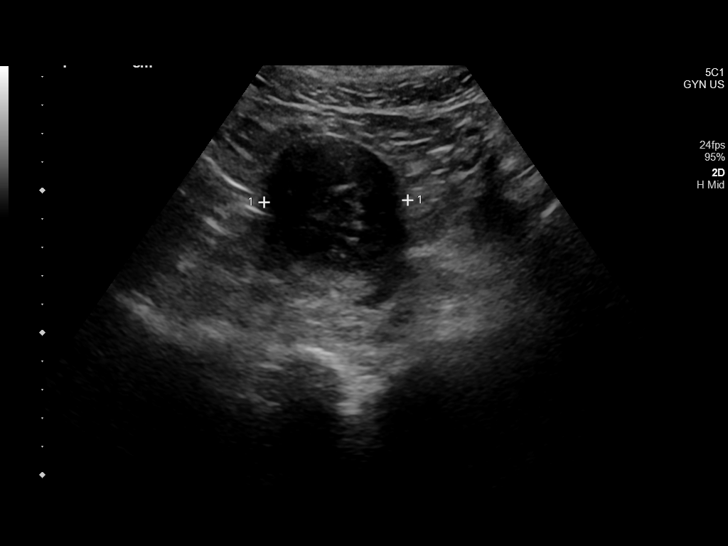
[im 21/51]
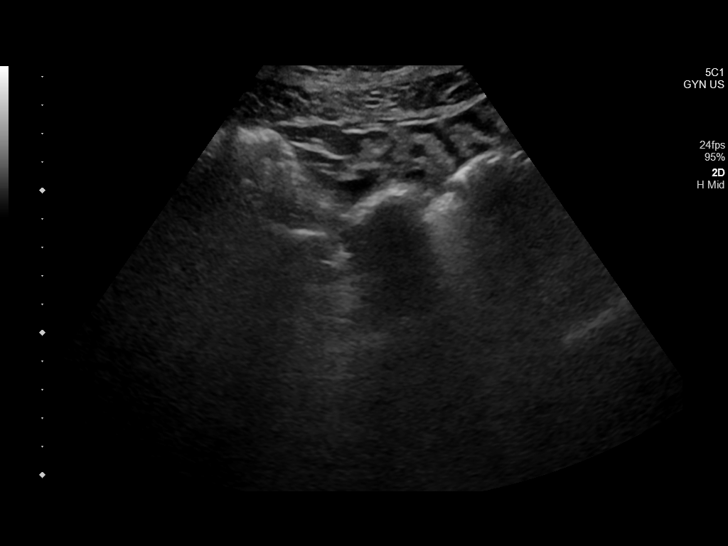
[im 26/51]
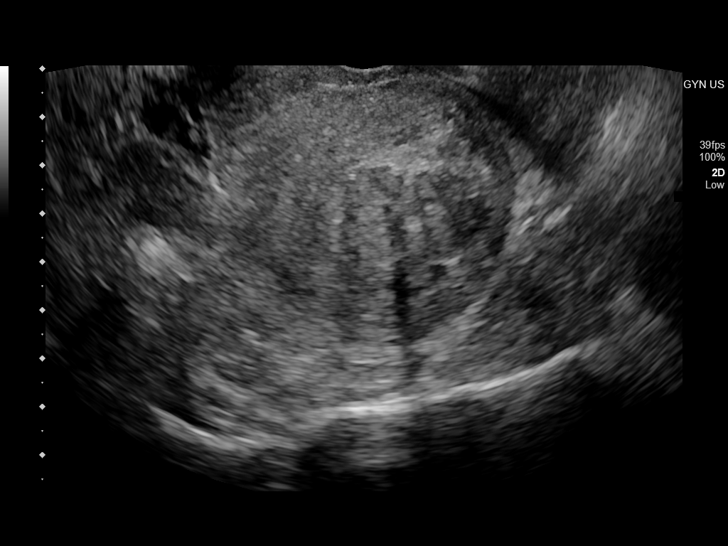
[im 30/51]
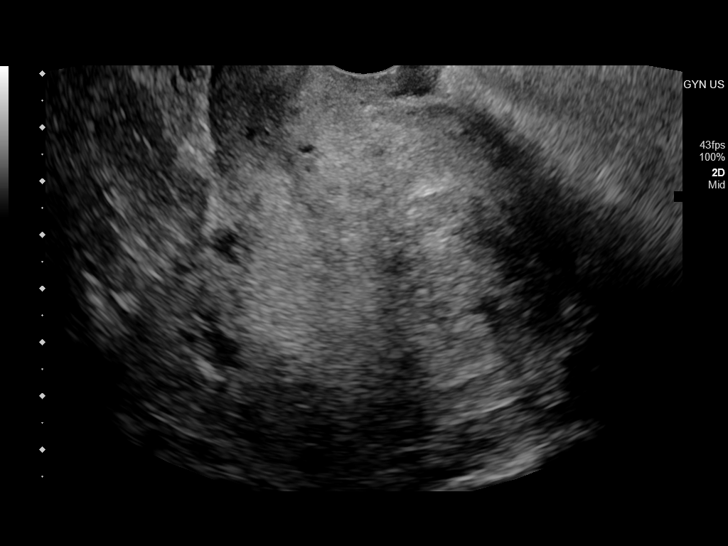
[im 34/51]
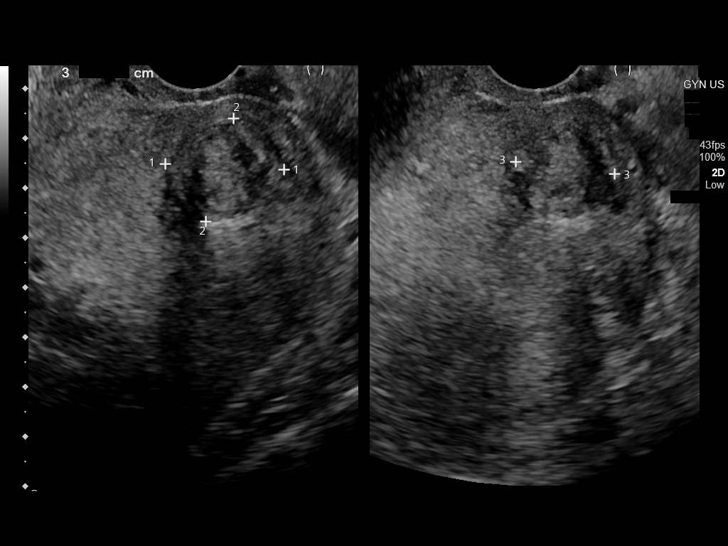
[im 38/51]
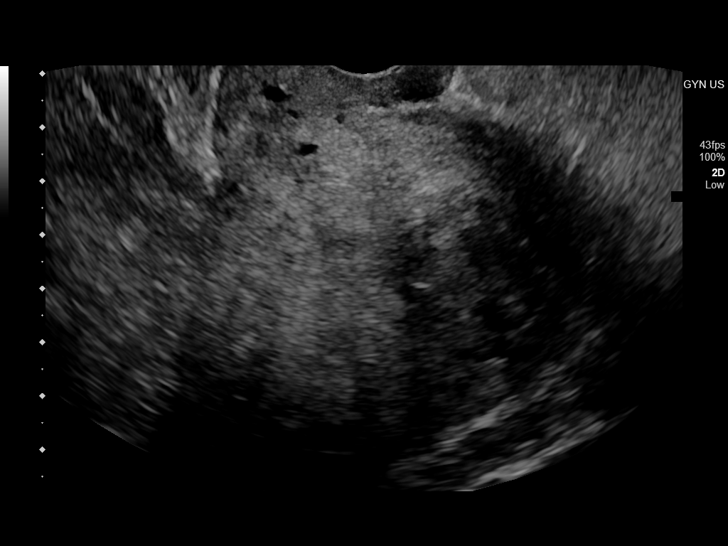
[im 42/51]
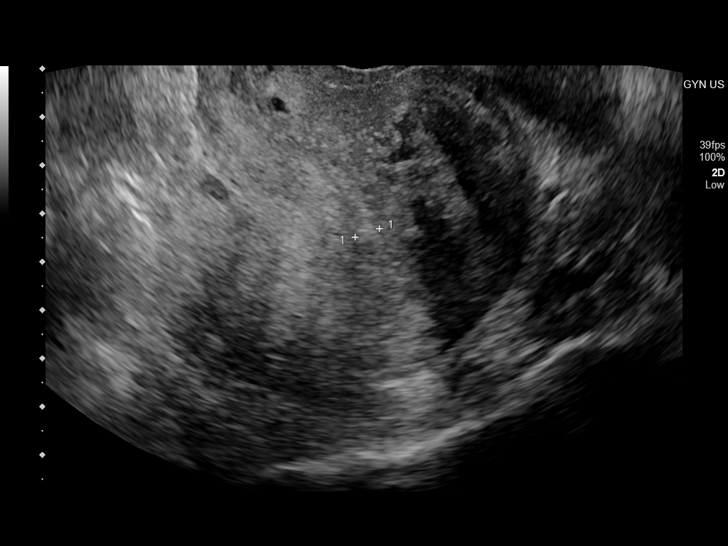
[im 46/51]
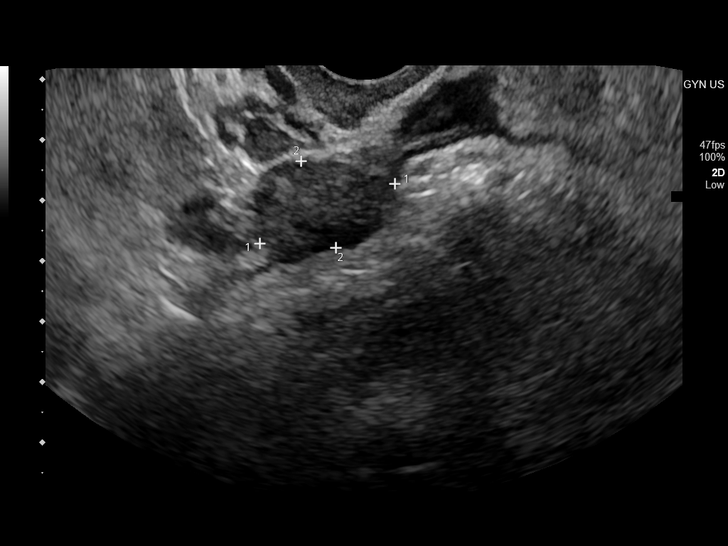
[im 51/51]
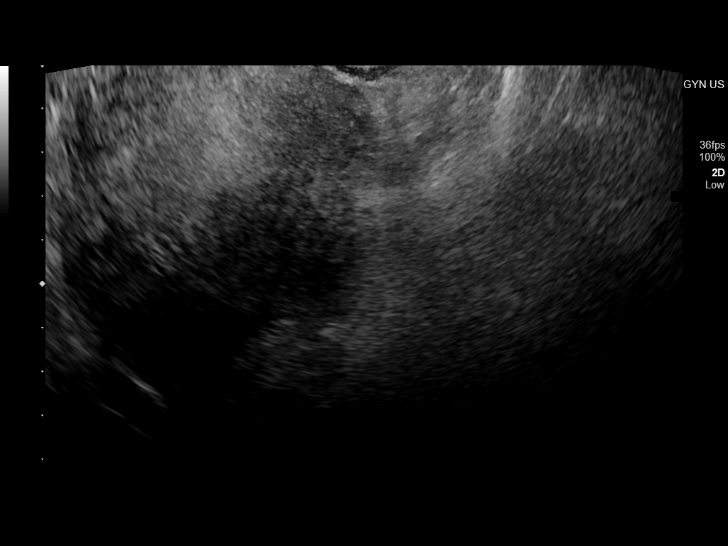

[13 of 25 positions shown; findings below may reference images not displayed]

FINDINGS: Uterus

Measurements: 9.5 x 5.5 x 6.4 cm = volume: 174 mL. The uterus is
diffusely inhomogeneous in appearance. There are multiple masses
within the uterus consistent with leiomyomatous change. There is a
2.8 x 2.2 x 2.8 cm mass in the left mid fundal region. There is a
2.4 x 2.2 x 2.0 cm mass in the left anterior fundal region. There is
a 5.7 x 5.5 x 6.1 cm mass toward the left in the posterior fundal
region. There is a right-sided fundal mass measuring 2.3 x 1.7 x
cm.

Endometrium

Thickness: 5 mm.  No focal abnormality visualized.

Right ovary

Measurements: 2.4 x 1.5 x 1.8 cm = volume: 3.5 mL. Normal
appearance/no adnexal mass.

Left ovary

Unable to visualize by either transabdominal or transvaginal
technique. No left-sided pelvic mass evident.

Other findings

Trace free fluid.
IMPRESSION: 1. Leiomyomatous uterus with several masses delineated within the
uterus, largest measuring 5.7 x 5.5 x 6.1 cm. No appreciable
endometrial thickening. Note that leiomyomatous change was present
previously with largest mass similar in appearance compared to prior
study.

2. Normal appearing right ovary. Unable to visualize left ovary by
either transabdominal transvaginal technique. No left-sided pelvic
mass appreciable on this study.

3. Trace free pelvic fluid which potentially could be physiologic in
etiology.

## 2019-10-19 NOTE — Telephone Encounter (Signed)
Pt states she is cramping really bad and her leg are weak.  Doesn't know what to do about the medication AMS rx'd her.  She is currently at work but states she may just go home as she feels so bad. The large clot just happened around 10:20 and it scared her.  Pt aware msg will be sent to AMS who is on call today.  What to do?  Pt aware if saturates a regular pad every 15min-1hr to go to ED.  Pt doesn't have reg pads but a long thin pad as she hasn't been bleeding heavy like this until now.

## 2019-10-19 NOTE — ED Provider Notes (Signed)
[facility] Emergency Department Provider Note  ____________________________________________   I have reviewed the triage vital signs and the nursing notes.   HISTORY  Chief Complaint Vaginal Bleeding   History limited by: Not Limited   HPI Donna Zuniga is a 44 y.o. female who presents to the emergency department today because of concern for vaginal bleeding. The patient has a history of abnormal vaginal bleeding and is being seen by ob/gyn for this problem. Has been taking norethindrone. Yesterday started noticing some heavier bleeding. This was accompanied by abdominal cramping. Today she continued to have heavier bleeding and then passed a large clot. The patient states that her bleeding has not been this heavy in the past. Does have history of fibroids and states she has talked to her doctor about possibly needing a uterine artery embolization. The patient has not had any significant shortness of breath or chest pain.    Records reviewed. Per medical record review patient has a history of leiomyoma.   Past Medical History:  Diagnosis Date  . Anxiety   . Cervical dysplasia   . Depression   . Family history of ovarian cancer    doesn't meet BCBS Fed genetic testing guidelines.  Marland Kitchen Heart palpitations    Dr. Posey Pronto at [facility]  . Leiomyoma 2014  . Pre-diabetes   . Vitamin D deficiency 11/2015    Patient Active Problem List   Diagnosis Date Noted  . Family history of ovarian cancer 12/17/2017  . Overweight (BMI 25.0-29.9) 06/04/2017  . Enlarged thyroid 12/10/2016  . Nocturia 12/10/2016  . Pelvic pain 09/16/2016  . Leiomyoma 09/16/2016  . Situational mixed anxiety and depressive disorder 07/12/2016    Past Surgical History:  Procedure Laterality Date  . COLPOSCOPY    . LEEP      Prior to Admission medications   Medication Sig Start Date End Date Taking? Authorizing Provider  Cholecalciferol (VITAMIN D3) 2000 units capsule Take 1 capsule by  mouth daily.    [provider]  hydrochlorothiazide (MICROZIDE) 12.5 MG capsule Take by mouth. 01/12/19 01/12/20  [provider]  hydrOXYzine (ATARAX/VISTARIL) 25 MG tablet Take 25 mg by mouth 3 (three) times daily. 03/09/19   [provider]  norethindrone (AYGESTIN) 5 MG tablet Take 1 tablet (5 mg total) by mouth daily. 10/04/19   [name], MD  phentermine (ADIPEX-P) 37.5 MG tablet Take 1 tablet (37.5 mg total) by mouth daily before breakfast. 10/06/19   [name], MD    Allergies Bupropion  Family History  Problem Relation Age of Onset  . Ovarian cancer Maternal Aunt 83  . Hypertension Father   . Other Mother        fibroids/leiomyoma of uterus  . Breast cancer Cousin   . Breast cancer Cousin     Social History Social History   Tobacco Use  . Smoking status: Former Research scientist (life sciences)  . Smokeless tobacco: Never Used  Vaping Use  . Vaping Use: Never used  Substance Use Topics  . Alcohol use: Yes    Comment: occ  . Drug use: No    Review of Systems Constitutional: No fever/chills Eyes: No visual changes. ENT: No sore throat. Cardiovascular: Denies chest pain. Respiratory: Denies shortness of breath. Gastrointestinal: Positive for abdominal cramping.  Genitourinary: Positive for abnormal vaginal bleeding.  Musculoskeletal: Negative for back pain. Skin: Negative for rash. Neurological: Negative for headaches, focal weakness or numbness.  ____________________________________________   PHYSICAL EXAM:  VITAL SIGNS: ED Triage Vitals  Enc Vitals Group  BP 10/19/19 1222 (!) 143/74     Pulse Rate 10/19/19 1222 85     Resp 10/19/19 1222 18     Temp 10/19/19 1222 98.9 F (37.2 C)     Temp src --      SpO2 10/19/19 1222 96 %     Weight 10/19/19 1218 188 lb (85.3 kg)     Height 10/19/19 1218 5\' 7"  (1.702 m)     Head Circumference --      Peak Flow --      Pain Score 10/19/19 1218 4   Constitutional: Alert and oriented.  Eyes:  Conjunctivae are normal.  ENT      Head: Normocephalic and atraumatic.      Nose: No congestion/rhinnorhea.      Mouth/Throat: Mucous membranes are moist.      Neck: No stridor. Hematological/Lymphatic/Immunilogical: No cervical lymphadenopathy. Cardiovascular: Normal rate, regular rhythm.  No murmurs, rubs, or gallops.  Respiratory: Normal respiratory effort without tachypnea nor retractions. Breath sounds are clear and equal bilaterally. No wheezes/rales/rhonchi. Gastrointestinal: Soft and non tender. No rebound. No guarding.  Genitourinary: Deferred Musculoskeletal: Normal range of motion in all extremities. No lower extremity edema. Neurologic:  Normal speech and language. No gross focal neurologic deficits are appreciated.  Skin:  Skin is warm, dry and intact. No rash noted. Psychiatric: Mood and affect are normal. Speech and behavior are normal. Patient exhibits appropriate insight and judgment.  ____________________________________________    LABS (pertinent positives/negatives)  Upreg negative CBC wbc 5.9, hgb 11.8, plt 278  ____________________________________________   EKG  None  ____________________________________________    RADIOLOGY  US transvaginal Leiomyomatous uterus. Normal right ovary. Left unable to be visualized.  ____________________________________________   PROCEDURES  Procedures  ____________________________________________   INITIAL IMPRESSION / ASSESSMENT AND PLAN / ED COURSE  Pertinent labs & imaging results that were available during my care of the patient were reviewed by me and considered in my medical decision making (see chart for details).   Patient presented to the emergency department today because of concern for vaginal bleeding. Patient is followed by ob/gyn for this however today it was heavier. [place] continues to show fibroid uterus. Blood work and repeat h/h without concerning anemia. Dr. Georgianne Fick with ob/gyn did discuss with  patient Lupron however she defers at this time. Will discharge to follow up as an outpatient.   ____________________________________________   FINAL CLINICAL IMPRESSION(S) / ED DIAGNOSES  Final diagnoses:  Vaginal bleeding     Note: This dictation was prepared with Dragon dictation. Any transcriptional errors that result from this process are unintentional     Nance Pear, MD 10/19/19 1724

## 2019-10-19 NOTE — ED Notes (Signed)
Full rainbow sent to lab.  

## 2019-10-19 NOTE — Telephone Encounter (Signed)
Patient calling waiting to be seen for pelvic pain and passing what seems to be the size of a baseball clot. Patient is wanting to know what she should do. Currently no opening in the schedule. Patient aware AMS is on call today.

## 2019-10-19 NOTE — Discharge Instructions (Addendum)
Please seek medical attention for any high fevers, chest pain, shortness of breath, change in behavior, persistent vomiting, bloody stool or any other new or concerning symptoms.  

## 2019-10-19 NOTE — ED Triage Notes (Signed)
Pt comes via POV from work with c/o heavy vaginal bleeding. Pt states she was work and went to stand up and it felt like she was urinating on herself. Pt states she noticed it was heavy bleeding. Pt states large clots and cramping in belly.

## 2019-10-20 ENCOUNTER — Telehealth: Payer: Self-pay | Admitting: Obstetrics and Gynecology

## 2019-10-20 NOTE — Telephone Encounter (Signed)
-----   Message from [name], MD sent at 10/19/2019  5:07 PM EDT ----- Regarding: Appointment Patient seen in ER today if we can move her endometrial biopsy appointment up to next week

## 2019-10-20 NOTE — Telephone Encounter (Signed)
Called and spoke with patient about moving appointment up. Per patient will call back to reschedule appointment to next week

## 2019-10-21 ENCOUNTER — Other Ambulatory Visit: Payer: Self-pay | Admitting: Obstetrics and Gynecology

## 2019-10-21 ENCOUNTER — Other Ambulatory Visit: Payer: Self-pay

## 2019-10-21 DIAGNOSIS — N939 Abnormal uterine and vaginal bleeding, unspecified: Secondary | ICD-10-CM

## 2019-10-21 MED ORDER — FERROUS SULFATE 325 (65 FE) MG PO TABS: 325.0000 mg | ORAL_TABLET | Freq: Two times a day (BID) | ORAL | 1 refills | Status: DC

## 2019-10-21 MED ORDER — MYFEMBREE 40-1-0.5 MG PO TABS: 1.0000 | ORAL_TABLET | Freq: Every day | ORAL | 11 refills | Status: DC

## 2019-10-21 NOTE — Telephone Encounter (Signed)
I put in an order to recheck Hgb but I have absolutely no openings anywhere this week, our plan was endometrial biopsy at next visit

## 2019-10-21 NOTE — Telephone Encounter (Signed)
Patient requesting labs to be released to go to [place] [facility] for lab draw

## 2019-10-21 NOTE — Telephone Encounter (Signed)
Patient is calling back reporting same thing that happened a couple of days ago has happen again today a large clot the size of a baseball has passed thru. Patient doesn't know if she is needing to have lab work done of if she can be seen sooner then Wednesday, 10/27/19 in [place]. Please advise

## 2019-10-22 ENCOUNTER — Other Ambulatory Visit: Payer: Self-pay | Admitting: Obstetrics and Gynecology

## 2019-10-22 DIAGNOSIS — N939 Abnormal uterine and vaginal bleeding, unspecified: Secondary | ICD-10-CM

## 2019-10-22 DIAGNOSIS — D251 Intramural leiomyoma of uterus: Secondary | ICD-10-CM

## 2019-10-22 LAB — CBC
Hematocrit: 36.9 % (ref 34.0–46.6)
Hemoglobin: 12.2 g/dL (ref 11.1–15.9)
MCH: 28 pg (ref 26.6–33.0)
MCHC: 33.1 g/dL (ref 31.5–35.7)
MCV: 85 fL (ref 79–97)
Platelets: 307 10*3/uL (ref 150–450)
RBC: 4.35 x10E6/uL (ref 3.77–5.28)
RDW: 14.6 % (ref 11.7–15.4)
WBC: 6.4 10*3/uL (ref 3.4–10.8)

## 2019-10-22 NOTE — Progress Notes (Unsigned)
CBC reviewed with patient, endometrial biopsy scheduled for next week.  CBC remains stable.  Referral to [facility] for [place].

## 2019-10-27 ENCOUNTER — Encounter: Payer: Self-pay | Admitting: Obstetrics and Gynecology

## 2019-10-27 ENCOUNTER — Other Ambulatory Visit (HOSPITAL_COMMUNITY)
Admission: RE | Admit: 2019-10-27 | Discharge: 2019-10-27 | Disposition: A | Discharge status: Home / Self Care | Payer: Federal, State, Local not specified - PPO | Source: Ambulatory Visit | Attending: Obstetrics and Gynecology | Admitting: Obstetrics and Gynecology

## 2019-10-27 ENCOUNTER — Other Ambulatory Visit: Payer: Self-pay

## 2019-10-27 ENCOUNTER — Ambulatory Visit (INDEPENDENT_AMBULATORY_CARE_PROVIDER_SITE_OTHER): Payer: Federal, State, Local not specified - PPO | Admitting: Obstetrics and Gynecology

## 2019-10-27 ENCOUNTER — Telehealth: Payer: Self-pay | Admitting: Advanced Practice Midwife

## 2019-10-27 VITALS — BP 127/78 | Ht 68.0 in | Wt 189.0 lb

## 2019-10-27 DIAGNOSIS — N939 Abnormal uterine and vaginal bleeding, unspecified: Secondary | ICD-10-CM

## 2019-10-27 DIAGNOSIS — D259 Leiomyoma of uterus, unspecified: Secondary | ICD-10-CM | POA: Diagnosis not present

## 2019-10-27 NOTE — Progress Notes (Addendum)
   ENDOMETRIAL BIOPSY     The indications for endometrial biopsy were reviewed.   Risks of the biopsy including cramping, bleeding, infection, uterine perforation, inadequate specimen and need for additional procedures  were discussed. The patient states she understands and agrees to undergo procedure today. Consent was signed. Time out was performed. Urine HCG was negative. A Graves speculum was placed and the cervix was brought into view.  The cervix was prepped with Betadine. A single-toothed tenaculum was not placed on the anterior lip of the cervix for traction. A 3 mm pipelle was introduced through the cervix into the endometrial cavity without difficulty to a depth of 9cm, and a small amount of tissue was obtained in two passes, the resulting specime sent to pathology. The instruments were removed from the patient's vagina. Minimal bleeding from the cervix was noted. The patient tolerated the procedure well. Routine post-procedure instructions were given to the patient.  She will be contacted by phone one results become available.     US PELVIC COMPLETE WITH TRANSVAGINAL  Result Date: 10/19/2019 CLINICAL DATA:  Vaginal bleeding EXAM: TRANSABDOMINAL AND TRANSVAGINAL ULTRASOUND OF PELVIS TECHNIQUE: Study was performed transabdominally to optimize pelvic field of view evaluation and transvaginally to optimize internal visceral architecture evaluation. COMPARISON:  May 06, 2019 FINDINGS: Uterus Measurements: 9.5 x 5.5 x 6.4 cm = volume: 174 mL. The uterus is diffusely inhomogeneous in appearance. There are multiple masses within the uterus consistent with leiomyomatous change. There is a 2.8 x 2.2 x 2.8 cm mass in the left mid fundal region. There is a 2.4 x 2.2 x 2.0 cm mass in the left anterior fundal region. There is a 5.7 x 5.5 x 6.1 cm mass toward the left in the posterior fundal region. There is a right-sided fundal mass measuring 2.3 x 1.7 x 1.8 cm. Endometrium Thickness: 5 mm.  No focal  abnormality visualized. Right ovary Measurements: 2.4 x 1.5 x 1.8 cm = volume: 3.5 mL. Normal appearance/no adnexal mass. Left ovary Unable to visualize by either transabdominal or transvaginal technique. No left-sided pelvic mass evident. Other findings Trace free fluid. IMPRESSION: 1. Leiomyomatous uterus with several masses delineated within the uterus, largest measuring 5.7 x 5.5 x 6.1 cm. No appreciable endometrial thickening. Note that leiomyomatous change was present previously with largest mass similar in appearance compared to prior study. 2. Normal appearing right ovary. Unable to visualize left ovary by either transabdominal transvaginal technique. No left-sided pelvic mass appreciable on this study. 3. Trace free pelvic fluid which potentially could be physiologic in etiology. Electronically Signed   By: Lowella Grip III M.D.   On: 10/19/2019 15:35   [name], MD, [facility], [place]

## 2019-10-27 NOTE — Telephone Encounter (Signed)
Patient is calling about her FMLA paper work. Patient plans on bring paper work by but has some questions and work like a call back. Please advise

## 2019-10-28 LAB — SURGICAL PATHOLOGY

## 2019-10-28 NOTE — Telephone Encounter (Signed)
She was seen in the ER last week and had an utlrasound, can we send that report Dr. Dr. Liam Rogers offices at [facility] as part of her records

## 2019-10-29 DIAGNOSIS — N939 Abnormal uterine and vaginal bleeding, unspecified: Secondary | ICD-10-CM

## 2019-10-31 ENCOUNTER — Ambulatory Visit: Payer: Self-pay

## 2019-10-31 ENCOUNTER — Other Ambulatory Visit: Payer: Self-pay | Admitting: Obstetrics and Gynecology

## 2019-10-31 MED ORDER — LEUPROLIDE ACETATE 3.75 MG IM KIT
3.7500 mg | PACK | Freq: Once | INTRAMUSCULAR | 1 refills | Status: AC
Start: 2019-10-31 — End: 2019-10-31

## 2019-10-31 NOTE — Telephone Encounter (Signed)
The Rx for the lupron was sent today 10/31/2019 it will need to go through prior authorization not sure which MA can do this since I don't have Maudie Mercury anymore

## 2019-11-01 ENCOUNTER — Telehealth: Payer: Self-pay | Admitting: Obstetrics and Gynecology

## 2019-11-01 ENCOUNTER — Telehealth: Payer: Self-pay

## 2019-11-01 DIAGNOSIS — N939 Abnormal uterine and vaginal bleeding, unspecified: Secondary | ICD-10-CM | POA: Diagnosis not present

## 2019-11-01 NOTE — Telephone Encounter (Signed)
I saw AMS sent you msg on this.

## 2019-11-01 NOTE — Telephone Encounter (Signed)
FMLA/DISABILITY form for Donna Zuniga filled out, signature obtained and given to [facility] for processing.

## 2019-11-01 NOTE — Telephone Encounter (Signed)
Patient is calling to find out the status for the prior authorization on her Lupron. Please advise

## 2019-11-01 NOTE — Telephone Encounter (Signed)
Fax received from pharmacy and delivered to me ~1:40pm. Contact patient insurance plan [phone] to initiate prior auth. Questions answered. Authorization received good from today 11/01/19 thru 04/29/2020. Patient aware via my chart.

## 2019-11-01 NOTE — Telephone Encounter (Signed)
Pt brought her FMLA papers last week. All questions answered.

## 2019-11-01 NOTE — Telephone Encounter (Signed)
Some communication w/patient regarding Lupron Depot PA thru my chart.

## 2019-11-02 ENCOUNTER — Ambulatory Visit (INDEPENDENT_AMBULATORY_CARE_PROVIDER_SITE_OTHER): Payer: Federal, State, Local not specified - PPO

## 2019-11-02 ENCOUNTER — Other Ambulatory Visit: Payer: Self-pay

## 2019-11-02 DIAGNOSIS — N921 Excessive and frequent menstruation with irregular cycle: Secondary | ICD-10-CM | POA: Diagnosis not present

## 2019-11-02 DIAGNOSIS — N939 Abnormal uterine and vaginal bleeding, unspecified: Secondary | ICD-10-CM

## 2019-11-02 DIAGNOSIS — D500 Iron deficiency anemia secondary to blood loss (chronic): Secondary | ICD-10-CM | POA: Diagnosis not present

## 2019-11-02 DIAGNOSIS — R109 Unspecified abdominal pain: Secondary | ICD-10-CM | POA: Diagnosis not present

## 2019-11-02 DIAGNOSIS — D259 Leiomyoma of uterus, unspecified: Secondary | ICD-10-CM | POA: Diagnosis not present

## 2019-11-02 LAB — HEMOGLOBIN: Hemoglobin: 11.4 g/dL (ref 11.1–15.9)

## 2019-11-02 MED ORDER — LEUPROLIDE ACETATE 3.75 MG IM KIT
3.7500 mg | PACK | Freq: Once | INTRAMUSCULAR | Status: AC
  Administered 2019-11-02: 3.75 mg via INTRAMUSCULAR

## 2019-11-02 NOTE — Progress Notes (Signed)
Pt here for Lupron Depot inj which was given IM right glut.  NDC# [phone]

## 2019-11-05 ENCOUNTER — Ambulatory Visit: Payer: Federal, State, Local not specified - PPO | Admitting: Obstetrics and Gynecology

## 2019-11-15 DIAGNOSIS — D259 Leiomyoma of uterus, unspecified: Secondary | ICD-10-CM | POA: Diagnosis not present

## 2019-11-15 DIAGNOSIS — Z01812 Encounter for preprocedural laboratory examination: Secondary | ICD-10-CM | POA: Diagnosis not present

## 2019-11-15 DIAGNOSIS — D689 Coagulation defect, unspecified: Secondary | ICD-10-CM | POA: Diagnosis not present

## 2019-11-15 NOTE — Telephone Encounter (Signed)
Looks like this was sent to you.

## 2019-11-25 ENCOUNTER — Telehealth: Payer: Self-pay | Admitting: Obstetrics and Gynecology

## 2019-11-25 NOTE — Telephone Encounter (Signed)
Pt is requesting documentation for work, she is requesting to speak with a nurse. Please advise.

## 2019-11-25 NOTE — Telephone Encounter (Signed)
Spoke with patient and she stated she already has it taken care of

## 2019-11-25 NOTE — Telephone Encounter (Signed)
Hey this patient is calling to follow up on message left this morning. could you please call this patient. she needs a note stating she can't get the flu shot until she has her procedure done. Patient states she always gets sick when she gets the flu shot and is requesting a note to extend her not to get the flu shot until after she has her procedure. Please advise

## 2019-11-30 DIAGNOSIS — D252 Subserosal leiomyoma of uterus: Secondary | ICD-10-CM | POA: Diagnosis not present

## 2019-11-30 DIAGNOSIS — D250 Submucous leiomyoma of uterus: Secondary | ICD-10-CM | POA: Diagnosis not present

## 2019-11-30 DIAGNOSIS — D251 Intramural leiomyoma of uterus: Secondary | ICD-10-CM | POA: Diagnosis not present

## 2019-11-30 DIAGNOSIS — R109 Unspecified abdominal pain: Secondary | ICD-10-CM | POA: Diagnosis not present

## 2019-12-06 ENCOUNTER — Telehealth: Payer: Self-pay

## 2019-12-06 ENCOUNTER — Ambulatory Visit: Payer: Federal, State, Local not specified - PPO

## 2019-12-06 NOTE — Telephone Encounter (Signed)
Donna Zuniga w/[facility] calling to schedule shipment of Lupron Depot for Thursday 12/09/19 to [facility]. She said to note that the patient's [place] is changing the plan as of 01/07/20 and this will be the only fill as they are switching to [facility].

## 2019-12-10 ENCOUNTER — Ambulatory Visit: Payer: Federal, State, Local not specified - PPO

## 2019-12-10 ENCOUNTER — Ambulatory Visit (INDEPENDENT_AMBULATORY_CARE_PROVIDER_SITE_OTHER): Payer: Federal, State, Local not specified - PPO

## 2019-12-10 ENCOUNTER — Other Ambulatory Visit: Payer: Self-pay

## 2019-12-10 DIAGNOSIS — D252 Subserosal leiomyoma of uterus: Secondary | ICD-10-CM

## 2019-12-10 DIAGNOSIS — D251 Intramural leiomyoma of uterus: Secondary | ICD-10-CM

## 2019-12-10 MED ORDER — LEUPROLIDE ACETATE 3.75 MG IM KIT
3.7500 mg | PACK | Freq: Once | INTRAMUSCULAR | Status: AC
  Administered 2019-12-10: 3.75 mg via INTRAMUSCULAR

## 2019-12-10 NOTE — Progress Notes (Signed)
Pt here for Lupron Depot inj which was given IM left glut.  NDC# [phone]

## 2019-12-13 DIAGNOSIS — Z23 Encounter for immunization: Secondary | ICD-10-CM | POA: Diagnosis not present

## 2019-12-14 ENCOUNTER — Telehealth: Payer: Self-pay

## 2019-12-14 NOTE — Telephone Encounter (Signed)
Pt states she came in for her second Lupron injection and she now has numbness in the top's of her feet since Saturday. She is aware AMS is out of the office. Pt states she has seen [facility] in the past. Pt wants to know if she needs to go to urgent care. Please advise

## 2019-12-14 NOTE — Telephone Encounter (Signed)
Unsure if side effect. Pls ask Dr. Glennon Mac since in office. Thx.

## 2019-12-14 NOTE — Telephone Encounter (Signed)
Would this be a side effect?

## 2019-12-15 NOTE — Telephone Encounter (Signed)
Pt aware.

## 2019-12-22 ENCOUNTER — Encounter: Payer: Self-pay | Admitting: Obstetrics and Gynecology

## 2019-12-22 ENCOUNTER — Ambulatory Visit: Payer: Federal, State, Local not specified - PPO | Admitting: Obstetrics and Gynecology

## 2019-12-23 ENCOUNTER — Other Ambulatory Visit: Payer: Self-pay | Admitting: Obstetrics and Gynecology

## 2019-12-23 DIAGNOSIS — F419 Anxiety disorder, unspecified: Secondary | ICD-10-CM | POA: Diagnosis not present

## 2019-12-23 DIAGNOSIS — I10 Essential (primary) hypertension: Secondary | ICD-10-CM | POA: Diagnosis not present

## 2019-12-23 DIAGNOSIS — R200 Anesthesia of skin: Secondary | ICD-10-CM | POA: Diagnosis not present

## 2019-12-23 DIAGNOSIS — R7303 Prediabetes: Secondary | ICD-10-CM | POA: Diagnosis not present

## 2019-12-23 DIAGNOSIS — Z0000 Encounter for general adult medical examination without abnormal findings: Secondary | ICD-10-CM | POA: Diagnosis not present

## 2019-12-23 MED ORDER — PHENTERMINE HCL 37.5 MG PO TABS: 37.5000 mg | ORAL_TABLET | Freq: Every day | ORAL | 0 refills | Status: DC

## 2019-12-25 ENCOUNTER — Other Ambulatory Visit: Payer: Self-pay | Admitting: Obstetrics and Gynecology

## 2019-12-25 NOTE — Progress Notes (Signed)
Current weight 190lbs

## 2019-12-25 NOTE — Telephone Encounter (Signed)
Medication follow up sometime in start of February as opposed to January

## 2019-12-27 NOTE — Telephone Encounter (Signed)
Patient is scheduled 02/17/20 in [place] with AMS

## 2019-12-28 ENCOUNTER — Ambulatory Visit: Payer: Federal, State, Local not specified - PPO | Admitting: Obstetrics and Gynecology

## 2019-12-28 DIAGNOSIS — Z20828 Contact with and (suspected) exposure to other viral communicable diseases: Secondary | ICD-10-CM | POA: Diagnosis not present

## 2019-12-28 DIAGNOSIS — U071 COVID-19: Secondary | ICD-10-CM | POA: Insufficient documentation

## 2019-12-28 DIAGNOSIS — J029 Acute pharyngitis, unspecified: Secondary | ICD-10-CM | POA: Diagnosis not present

## 2019-12-28 DIAGNOSIS — Z20822 Contact with and (suspected) exposure to covid-19: Secondary | ICD-10-CM | POA: Diagnosis not present

## 2019-12-29 ENCOUNTER — Encounter: Payer: Self-pay | Admitting: Emergency Medicine

## 2019-12-29 ENCOUNTER — Ambulatory Visit
Admission: EM | Admit: 2019-12-29 | Discharge: 2019-12-29 | Disposition: A | Discharge status: Home / Self Care | Payer: Federal, State, Local not specified - PPO | Attending: Sports Medicine | Admitting: Sports Medicine

## 2019-12-29 ENCOUNTER — Other Ambulatory Visit: Payer: Self-pay

## 2019-12-29 DIAGNOSIS — Z0000 Encounter for general adult medical examination without abnormal findings: Secondary | ICD-10-CM | POA: Diagnosis not present

## 2019-12-29 DIAGNOSIS — J029 Acute pharyngitis, unspecified: Secondary | ICD-10-CM

## 2019-12-29 DIAGNOSIS — D62 Acute posthemorrhagic anemia: Secondary | ICD-10-CM | POA: Diagnosis not present

## 2019-12-29 DIAGNOSIS — Z20822 Contact with and (suspected) exposure to covid-19: Secondary | ICD-10-CM | POA: Insufficient documentation

## 2019-12-29 DIAGNOSIS — R7303 Prediabetes: Secondary | ICD-10-CM | POA: Diagnosis not present

## 2019-12-29 DIAGNOSIS — I10 Essential (primary) hypertension: Secondary | ICD-10-CM | POA: Diagnosis not present

## 2019-12-29 LAB — GROUP A STREP BY PCR: Group A Strep by PCR: NOT DETECTED

## 2019-12-29 NOTE — ED Provider Notes (Signed)
[facility]    CSN: [id number] Arrival date & time: 12/29/19  1750      History   Chief Complaint Chief Complaint  Patient presents with  . Sore Throat    HPI Donna Zuniga is a 44 y.o. female.   HPI   44 year old female here for evaluation of sore throat that started 3 days ago.  Patient denies any other associated symptoms including fever, runny nose, ear pain or pressure, cough, shortness of breath, body aches, or GI issues.  Past Medical History:  Diagnosis Date  . Anxiety   . Cervical dysplasia   . Depression   . Family history of ovarian cancer    doesn't meet BCBS Fed genetic testing guidelines.  Marland Kitchen Heart palpitations    Dr. Posey Pronto at [facility]  . Leiomyoma 2014  . Pre-diabetes   . Vitamin D deficiency 11/2015    Patient Active Problem List   Diagnosis Date Noted  . Family history of ovarian cancer 12/17/2017  . Overweight (BMI 25.0-29.9) 06/04/2017  . Enlarged thyroid 12/10/2016  . Nocturia 12/10/2016  . Pelvic pain 09/16/2016  . Leiomyoma 09/16/2016  . Situational mixed anxiety and depressive disorder 07/12/2016    Past Surgical History:  Procedure Laterality Date  . COLPOSCOPY    . LEEP      OB History    Gravida  4   Para  2   Term  2   Preterm      AB  2   Living  2     SAB      IAB      Ectopic      Multiple      Live Births  2            Home Medications    Prior to Admission medications   Medication Sig Start Date End Date Taking? Authorizing Provider  Cholecalciferol (VITAMIN D3) 2000 units capsule Take 1 capsule by mouth daily.   Yes [provider]  hydrochlorothiazide (MICROZIDE) 12.5 MG capsule Take 12.5 mg by mouth daily.  01/12/19 01/12/20 Yes [provider]  norethindrone (AYGESTIN) 5 MG tablet Take 1 tablet (5 mg total) by mouth daily. 10/04/19  Yes [name], MD  ferrous sulfate (FERROUSUL) 325 (65 FE) MG tablet Take 1 tablet (325 mg total) by mouth 2 (two) times  daily. 10/21/19 12/29/19 Yes [name], MD  phentermine (ADIPEX-P) 37.5 MG tablet Take 1 tablet (37.5 mg total) by mouth daily before breakfast. 12/23/19 12/29/19  [name], MD    Family History Family History  Problem Relation Age of Onset  . Ovarian cancer Maternal Aunt 3  . Hypertension Father   . Other Mother        fibroids/leiomyoma of uterus  . Breast cancer Cousin   . Breast cancer Cousin     Social History Social History   Tobacco Use  . Smoking status: Former Research scientist (life sciences)  . Smokeless tobacco: Never Used  Vaping Use  . Vaping Use: Never used  Substance Use Topics  . Alcohol use: Yes    Comment: occ  . Drug use: No     Allergies   Bupropion   Review of Systems Review of Systems  Constitutional: Negative for activity change, appetite change and fever.  HENT: Positive for sore throat. Negative for sinus pain.   Respiratory: Negative for cough and shortness of breath.   Cardiovascular: Negative for chest pain.  Gastrointestinal: Negative for diarrhea, nausea and vomiting.  Musculoskeletal: Negative for  arthralgias and myalgias.  Skin: Negative for rash.  Neurological: Negative for headaches.  Hematological: Negative.   Psychiatric/Behavioral: Negative.      Physical Exam Triage Vital Signs ED Triage Vitals  Enc Vitals Group     BP 12/29/19 1815 (!) 143/93     Pulse Rate 12/29/19 1815 93     Resp 12/29/19 1815 18     Temp 12/29/19 1815 98.3 F (36.8 C)     Temp Source 12/29/19 1815 Oral     SpO2 12/29/19 1815 100 %     Weight 12/29/19 1812 195 lb (88.5 kg)     Height 12/29/19 1812 5\' 8"  (1.727 m)     Head Circumference --      Peak Flow --      Pain Score 12/29/19 1812 8     Pain Loc --      Pain Edu? --      Excl. in [place]? --    No data found.  Updated Vital Signs BP (!) 143/93 (BP Location: Right Arm)   Pulse 93   Temp 98.3 F (36.8 C) (Oral)   Resp 18   Ht 5\' 8"  (1.727 m)   Wt 195 lb (88.5 kg)   LMP 12/29/2019   SpO2  100%   BMI 29.65 kg/m   Visual Acuity Right Eye Distance:   Left Eye Distance:   Bilateral Distance:    Right Eye Near:   Left Eye Near:    Bilateral Near:     Physical Exam Vitals and nursing note reviewed.  Constitutional:      General: She is not in acute distress.    Appearance: She is well-developed and normal weight. She is not toxic-appearing.  HENT:     Head: Normocephalic and atraumatic.     Right Ear: Tympanic membrane and ear canal normal. Tympanic membrane is not erythematous.     Left Ear: Tympanic membrane and ear canal normal. Tympanic membrane is not erythematous.     Nose: No congestion or rhinorrhea.     Mouth/Throat:     Mouth: Mucous membranes are moist.     Pharynx: Oropharynx is clear.     Tonsils: Tonsillar exudate present. 2+ on the right.  Cardiovascular:     Rate and Rhythm: Normal rate and regular rhythm.     Heart sounds: Normal heart sounds. No murmur heard. No gallop.   Pulmonary:     Effort: Pulmonary effort is normal.     Breath sounds: Normal breath sounds. No wheezing, rhonchi or rales.  Musculoskeletal:     Cervical back: Normal range of motion and neck supple.  Lymphadenopathy:     Cervical: Cervical adenopathy present.  Skin:    General: Skin is warm and dry.     Capillary Refill: Capillary refill takes less than 2 seconds.     Findings: No erythema or rash.  Neurological:     General: No focal deficit present.     Mental Status: She is alert and oriented to person, place, and time.  Psychiatric:        Mood and Affect: Mood normal.        Behavior: Behavior normal.      UC Treatments / Results  Labs (all labs ordered are listed, but only abnormal results are displayed) Labs Reviewed  GROUP A STREP BY PCR  SARS CORONAVIRUS 2 (TAT 6-24 HRS)  AEROBIC CULTURE (SUPERFICIAL SPECIMEN)    EKG   Radiology No results found.  Procedures Procedures (  including critical care time)  Medications Ordered in UC Medications -  No data to display  Initial Impression / Assessment and Plan / UC Course  I have reviewed the triage vital signs and the nursing notes.  Pertinent labs & imaging results that were available during my care of the patient were reviewed by me and considered in my medical decision making (see chart for details).   Is here for evaluation of sore throat that she has had for the past 3 days.  Patient has not had any associated fever or upper respiratory symptoms.  Physical exam reveals 2+ edematous and erythematous tonsillar pillars with white exudate.  Strep PCR was negative.  Will send throat culture.  Patient is also requesting to be tested for Covid which would be a send out.  Will discharge patient home with a diagnosis of pharyngitis and treat with conservative measures pending the throat culture.   Final Clinical Impressions(s) / UC Diagnoses   Final diagnoses:  Pharyngitis, unspecified etiology     Discharge Instructions     We are sending your throat swab for culture as your strep a PCR was negative.  Use over-the-counter Tylenol and ibuprofen as needed for pain.  Gargle with warm salt water 2-3 times a day to help decrease the size of your tonsils and soothe your throat pain.  If your symptoms worsen, or new symptoms develop, return for reevaluation or see your primary care provider.    ED Prescriptions    None     PDMP not reviewed this encounter.   Margarette Canada, NP 12/29/19 1928

## 2019-12-29 NOTE — ED Triage Notes (Signed)
Patient c/o sore throat that started Monday. She is requesting to be tested for COVID.

## 2019-12-29 NOTE — Discharge Instructions (Signed)
We are sending your throat swab for culture as your strep a PCR was negative.  Use over-the-counter Tylenol and ibuprofen as needed for pain.  Gargle with warm salt water 2-3 times a day to help decrease the size of your tonsils and soothe your throat pain.  If your symptoms worsen, or new symptoms develop, return for reevaluation or see your primary care provider.

## 2019-12-30 LAB — SARS CORONAVIRUS 2 (TAT 6-24 HRS): SARS Coronavirus 2: NEGATIVE

## 2019-12-31 LAB — AEROBIC CULTURE W GRAM STAIN (SUPERFICIAL SPECIMEN)
Gram Stain: NONE SEEN
Special Requests: NORMAL

## 2020-01-04 ENCOUNTER — Telehealth: Payer: Self-pay | Admitting: Obstetrics and Gynecology

## 2020-01-04 NOTE — Telephone Encounter (Signed)
Patient is calling to find out about the delivery of her Lupron injection. Patient is scheduled for 01/12/20 in [place]. Patient is wanting her injection to be delivered to her instead of the office. Could you look into this an advise patient?

## 2020-01-04 NOTE — Telephone Encounter (Signed)
Spoke w/patient to advise pharmacy will be contacting her. They have to ship today on rush. At the end of today, all accounts are being transferred to [facility].

## 2020-01-04 NOTE — Telephone Encounter (Signed)
Spoke w/patient. Advised per Larita Fife with [facility] on 12/06/19, they last fill will be the only fill d/t her insurance changing. Patient reports they have been calling her about her next shipment and stated that they are just waiting to get in touch with [place] to verify shipment.

## 2020-01-04 NOTE — Telephone Encounter (Signed)
Spoke w/[facility]. They will reach out to patient to schedule delivery.

## 2020-01-05 ENCOUNTER — Other Ambulatory Visit: Payer: Self-pay | Admitting: Obstetrics and Gynecology

## 2020-01-05 MED ORDER — LUPRON DEPOT (3-MONTH) 11.25 MG IM KIT: 11.2500 mg | PACK | INTRAMUSCULAR | 1 refills | Status: AC

## 2020-01-10 ENCOUNTER — Telehealth: Payer: Self-pay

## 2020-01-10 NOTE — Telephone Encounter (Signed)
Pt is requesting call back . Her doctors office is requesting more information in regards to her Lupron shot.

## 2020-01-12 ENCOUNTER — Ambulatory Visit: Payer: Federal, State, Local not specified - PPO

## 2020-01-12 DIAGNOSIS — Z48812 Encounter for surgical aftercare following surgery on the circulatory system: Secondary | ICD-10-CM | POA: Diagnosis not present

## 2020-01-12 DIAGNOSIS — D259 Leiomyoma of uterus, unspecified: Secondary | ICD-10-CM | POA: Diagnosis not present

## 2020-01-13 ENCOUNTER — Other Ambulatory Visit: Payer: Self-pay

## 2020-01-13 ENCOUNTER — Ambulatory Visit (INDEPENDENT_AMBULATORY_CARE_PROVIDER_SITE_OTHER): Payer: Federal, State, Local not specified - PPO

## 2020-01-13 DIAGNOSIS — R102 Pelvic and perineal pain: Secondary | ICD-10-CM

## 2020-01-13 MED ORDER — LEUPROLIDE ACETATE 3.75 MG IM KIT
3.7500 mg | PACK | Freq: Once | INTRAMUSCULAR | Status: AC
  Administered 2020-01-13: 3.75 mg via INTRAMUSCULAR

## 2020-01-13 NOTE — Telephone Encounter (Signed)
Spoke with patient. She was able to get the information she needed and received her meds. Has apt to receive injection today.

## 2020-01-17 ENCOUNTER — Other Ambulatory Visit: Payer: Self-pay | Admitting: Obstetrics and Gynecology

## 2020-01-19 DIAGNOSIS — Z48812 Encounter for surgical aftercare following surgery on the circulatory system: Secondary | ICD-10-CM | POA: Diagnosis not present

## 2020-01-19 DIAGNOSIS — M79671 Pain in right foot: Secondary | ICD-10-CM | POA: Diagnosis not present

## 2020-01-19 DIAGNOSIS — D259 Leiomyoma of uterus, unspecified: Secondary | ICD-10-CM | POA: Diagnosis not present

## 2020-01-19 DIAGNOSIS — I890 Lymphedema, not elsewhere classified: Secondary | ICD-10-CM | POA: Diagnosis not present

## 2020-02-02 DIAGNOSIS — F331 Major depressive disorder, recurrent, moderate: Secondary | ICD-10-CM | POA: Diagnosis not present

## 2020-02-08 ENCOUNTER — Other Ambulatory Visit: Payer: Self-pay | Admitting: Obstetrics and Gynecology

## 2020-02-08 ENCOUNTER — Telehealth: Payer: Self-pay

## 2020-02-08 NOTE — Telephone Encounter (Signed)
Precious from [place] calling; they need a new rx for lupron.  Fax# [phone]; phone # [phone]

## 2020-02-08 NOTE — Telephone Encounter (Signed)
We will talk about discontinuing at her visit this week

## 2020-02-15 NOTE — Telephone Encounter (Signed)
So at this point being out from the ablation we can discontinue the lupron

## 2020-02-15 NOTE — Telephone Encounter (Signed)
Pt calling; tried to order [facility] shot[facility] was no refills on rx; pt states she is two days behind in getting inj.  [phone]

## 2020-02-15 NOTE — Telephone Encounter (Signed)
Pt aware.

## 2020-02-17 ENCOUNTER — Ambulatory Visit: Payer: Federal, State, Local not specified - PPO

## 2020-02-17 ENCOUNTER — Other Ambulatory Visit: Payer: Self-pay | Admitting: Obstetrics and Gynecology

## 2020-02-17 ENCOUNTER — Ambulatory Visit: Payer: Federal, State, Local not specified - PPO | Admitting: Obstetrics and Gynecology

## 2020-02-17 DIAGNOSIS — N939 Abnormal uterine and vaginal bleeding, unspecified: Secondary | ICD-10-CM

## 2020-02-17 DIAGNOSIS — D259 Leiomyoma of uterus, unspecified: Secondary | ICD-10-CM

## 2020-02-17 NOTE — Telephone Encounter (Signed)
Please advise 

## 2020-02-17 NOTE — Telephone Encounter (Signed)
[place] and follow up, order is in can be anytime in the next 4 weeks

## 2020-02-18 NOTE — Telephone Encounter (Signed)
Patient is scheduled for 03/03/20 for u/s and follow up

## 2020-02-22 ENCOUNTER — Ambulatory Visit: Payer: Federal, State, Local not specified - PPO | Admitting: Obstetrics and Gynecology

## 2020-03-01 ENCOUNTER — Other Ambulatory Visit: Payer: Self-pay | Admitting: Obstetrics and Gynecology

## 2020-03-01 DIAGNOSIS — N939 Abnormal uterine and vaginal bleeding, unspecified: Secondary | ICD-10-CM

## 2020-03-01 DIAGNOSIS — D259 Leiomyoma of uterus, unspecified: Secondary | ICD-10-CM

## 2020-03-01 DIAGNOSIS — F331 Major depressive disorder, recurrent, moderate: Secondary | ICD-10-CM | POA: Diagnosis not present

## 2020-03-03 ENCOUNTER — Ambulatory Visit
Admission: RE | Admit: 2020-03-03 | Discharge: 2020-03-03 | Disposition: A | Discharge status: Home / Self Care | Payer: Federal, State, Local not specified - PPO | Source: Ambulatory Visit | Attending: Obstetrics and Gynecology | Admitting: Obstetrics and Gynecology

## 2020-03-03 ENCOUNTER — Ambulatory Visit: Payer: Federal, State, Local not specified - PPO

## 2020-03-03 ENCOUNTER — Other Ambulatory Visit: Payer: Self-pay

## 2020-03-03 ENCOUNTER — Ambulatory Visit: Payer: Federal, State, Local not specified - PPO | Admitting: Obstetrics and Gynecology

## 2020-03-03 DIAGNOSIS — D259 Leiomyoma of uterus, unspecified: Secondary | ICD-10-CM

## 2020-03-03 DIAGNOSIS — N939 Abnormal uterine and vaginal bleeding, unspecified: Secondary | ICD-10-CM | POA: Diagnosis not present

## 2020-03-03 IMAGING — US US PELVIS COMPLETE WITH TRANSVAGINAL
1 series · 13 of 25 positions shown · non-contrast
Comparison: [DATE]

CLINICAL DATA: Abnormal uterine bleeding.  Uterine leiomyoma.

EXAM:
TRANSABDOMINAL AND TRANSVAGINAL ULTRASOUND OF PELVIS
TECHNIQUE: Both transabdominal and transvaginal ultrasound examinations of the
pelvis were performed. Transabdominal technique was performed for
global imaging of the pelvis including uterus, ovaries, adnexal
regions, and pelvic cul-de-sac. It was necessary to proceed with
endovaginal exam following the transabdominal exam to visualize the
endometrium and ovaries.

[Series 1: us pelvic complete with transvaginal · 13 of 64 slices shown]
[im 1/64]
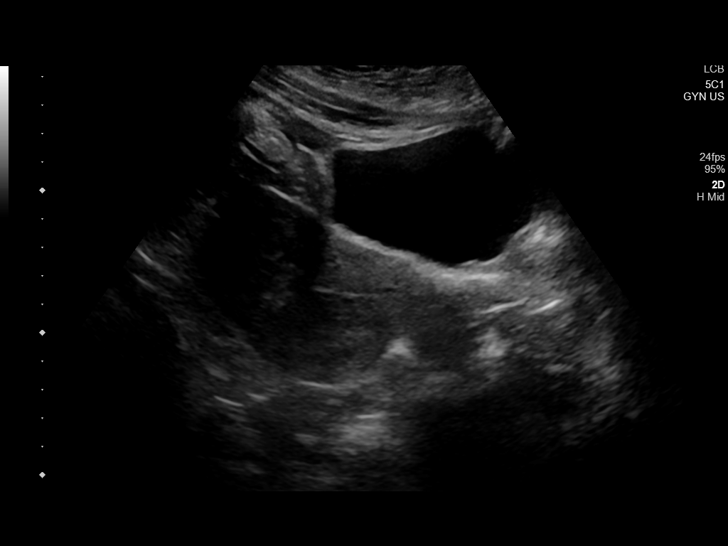
[im 6/64]
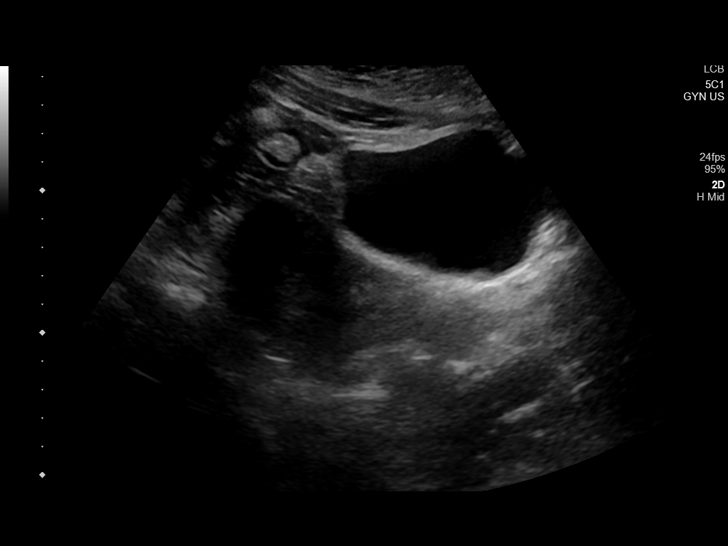
[im 11/64]
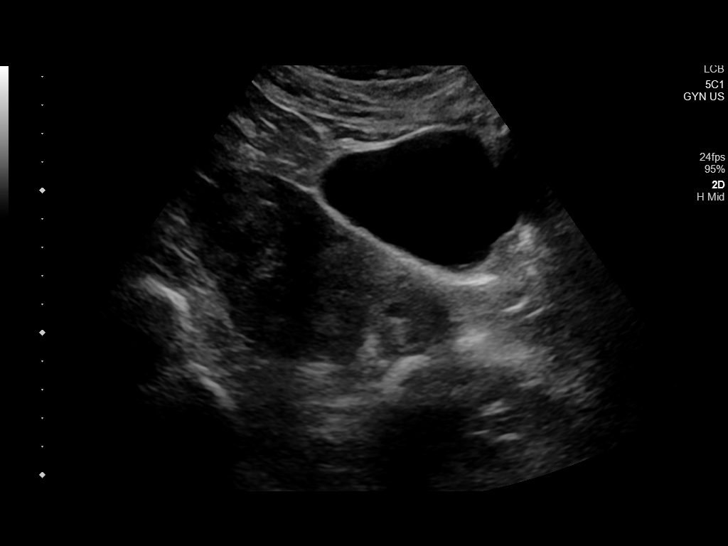
[im 16/64]
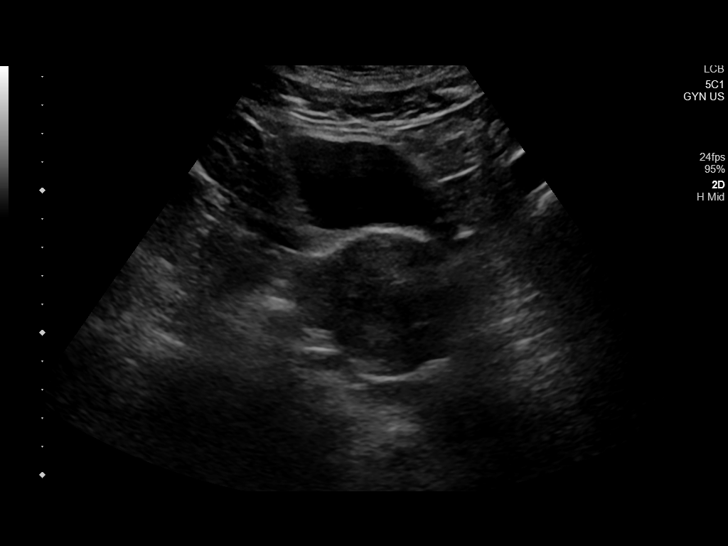
[im 22/64]
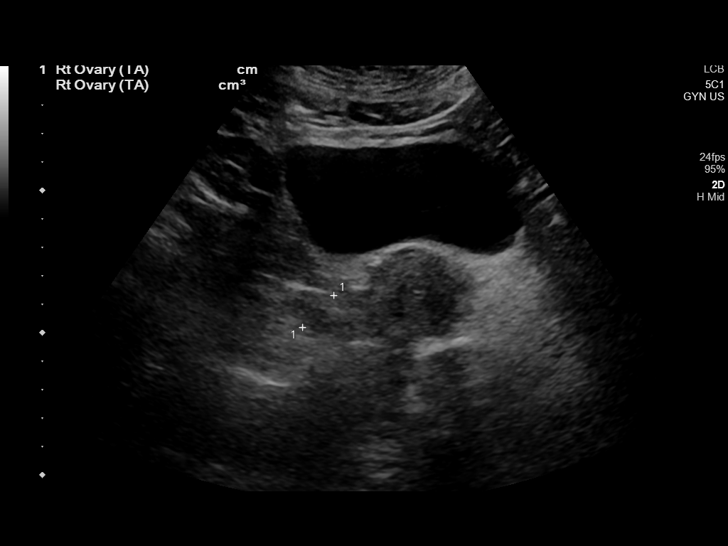
[im 27/64]
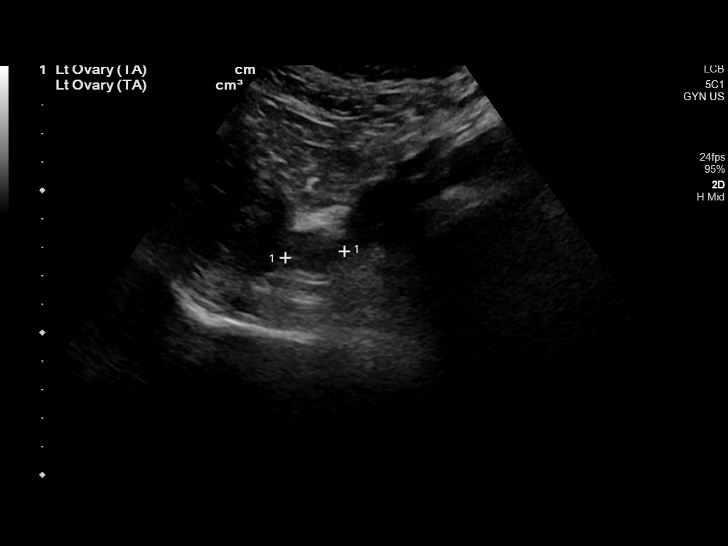
[im 32/64]
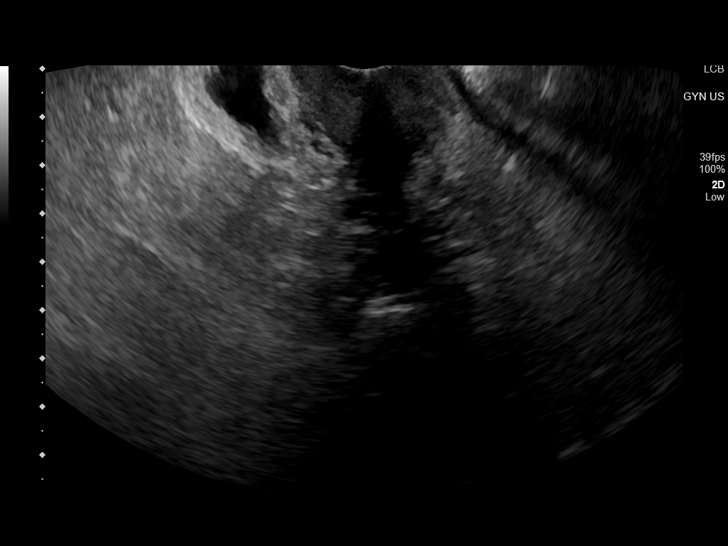
[im 37/64]
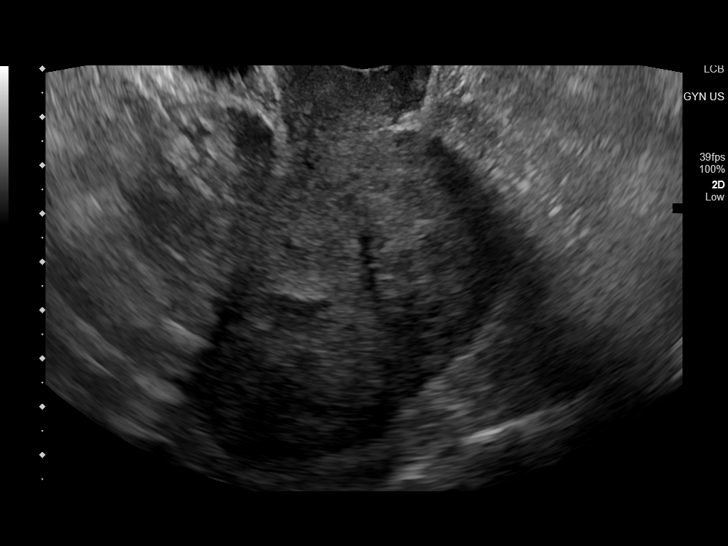
[im 43/64]
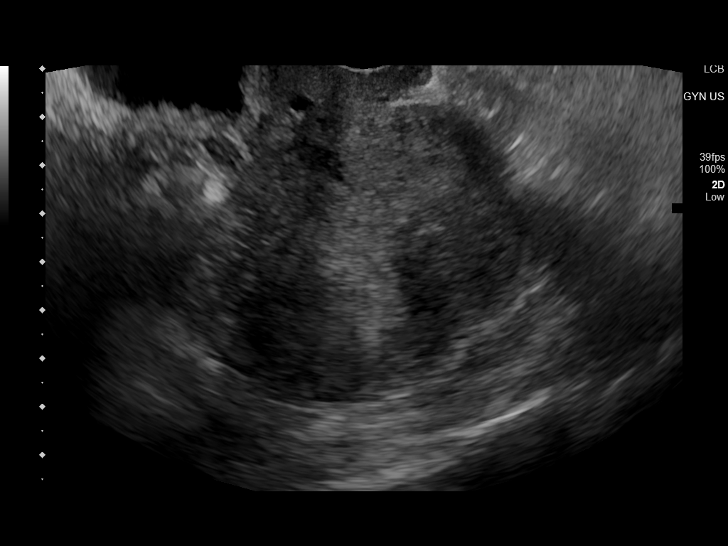
[im 48/64]
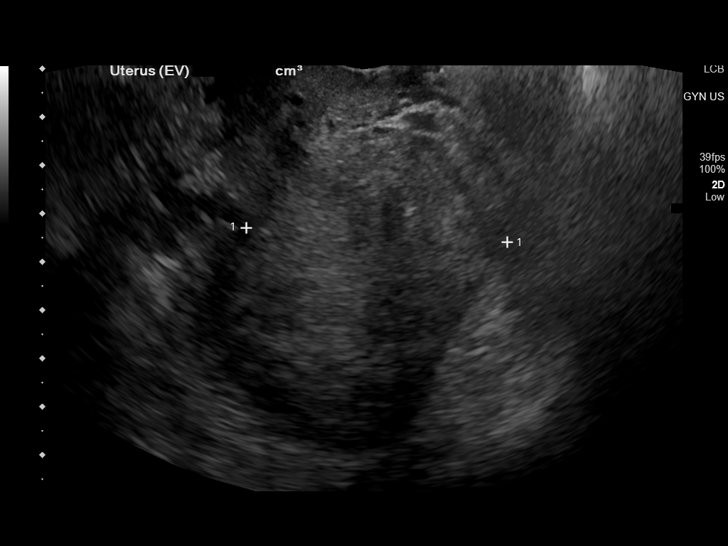
[im 53/64]
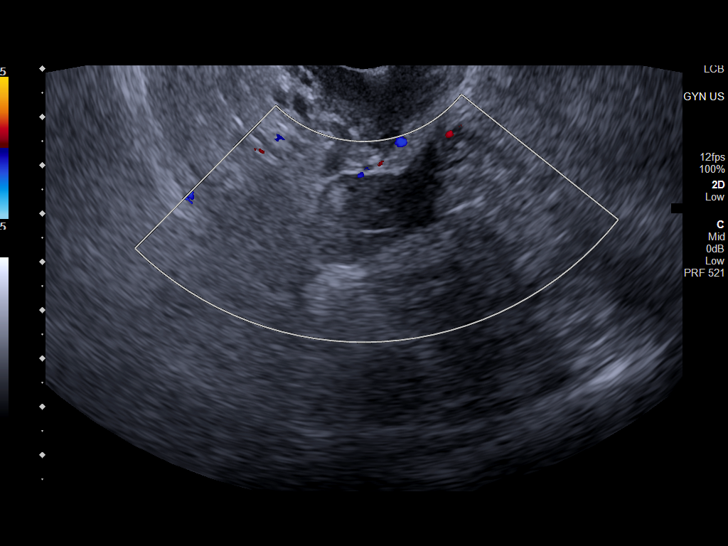
[im 58/64]
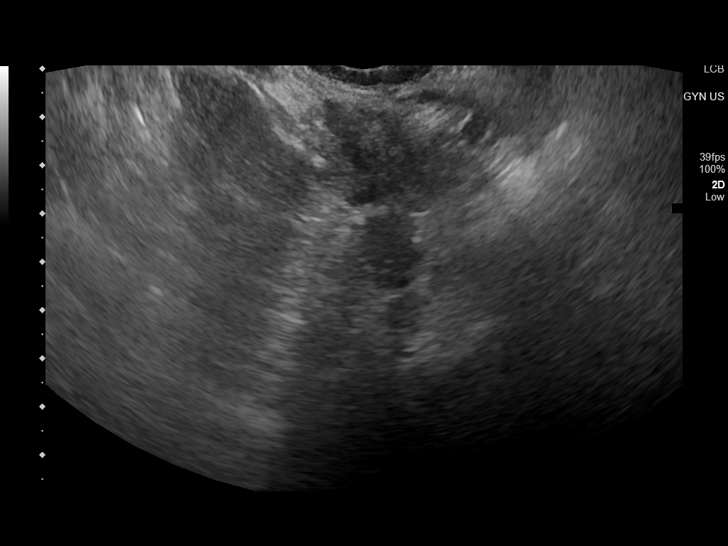
[im 64/64]
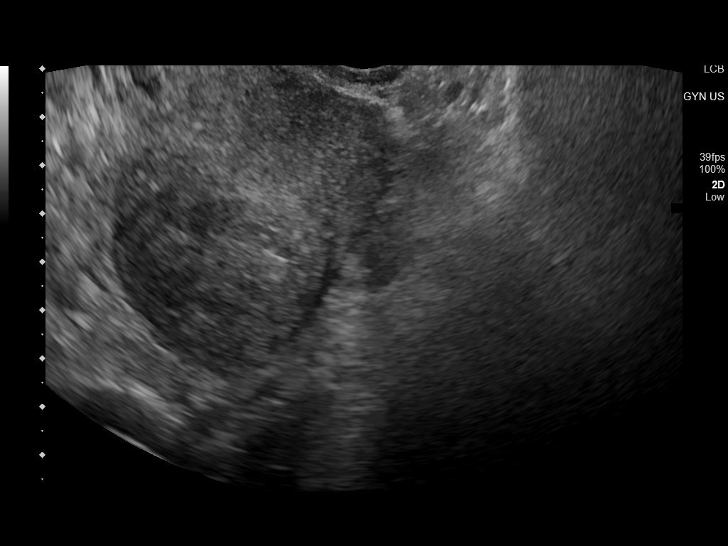

[13 of 25 positions shown; findings below may reference images not displayed]

FINDINGS: Uterus

Measurements: 7.5 x 5.5 x 5.4 cm = volume: 118 mL. Approximately 4
masses present with the largest over the left side of the uterine
fundus which is likely serosal in nature measuring 4.3 cm. Three
other smaller leiomyomas over the uterine fundus adjacent the
endometrium with the largest measuring 2.1 cm as submucosal nature
of 1 of these is possible.

Endometrium

Thickness: 7.4 mm.  No focal abnormality visualized.

Right ovary

Measurements: 2.3 x 1.6 x 1.8 cm = volume: 3 mL. Normal
appearance/no adnexal mass.

Left ovary

Measurements: 1.6 x 1.6 x 1.3 cm = volume: 2 mL. Normal
appearance/no adnexal mass.

Other findings

No abnormal free fluid.
IMPRESSION: 1. Normal size uterus with several leiomyomas as described, the
largest which is likely serosal in nature over the left side of the
uterine fundus measuring 4.3 cm. Three smaller leiomyomas as
described adjacent the endometrium as cannot exclude submucosal
nature which may be contributing to patient's uterine bleeding.

2.  Normal ovaries.

## 2020-03-07 ENCOUNTER — Other Ambulatory Visit: Payer: Self-pay

## 2020-03-07 ENCOUNTER — Ambulatory Visit (INDEPENDENT_AMBULATORY_CARE_PROVIDER_SITE_OTHER): Payer: Federal, State, Local not specified - PPO | Admitting: Obstetrics and Gynecology

## 2020-03-07 VITALS — Wt 205.0 lb

## 2020-03-07 DIAGNOSIS — D259 Leiomyoma of uterus, unspecified: Secondary | ICD-10-CM

## 2020-03-07 DIAGNOSIS — N939 Abnormal uterine and vaginal bleeding, unspecified: Secondary | ICD-10-CM | POA: Diagnosis not present

## 2020-03-07 DIAGNOSIS — D251 Intramural leiomyoma of uterus: Secondary | ICD-10-CM

## 2020-03-07 DIAGNOSIS — D252 Subserosal leiomyoma of uterus: Secondary | ICD-10-CM

## 2020-03-07 NOTE — Progress Notes (Signed)
I connected with Donna Zuniga on03/01/2020 at  4:30 PM EST by telephone and verified that I am speaking with the correct person using two identifiers.   I discussed the limitations, risks, security and privacy concerns of performing an evaluation and management service by telephone and the availability of in person appointments. I also discussed with the patient that there may be a patient responsible charge related to this service. The patient expressed understanding and agreed to proceed.  The patient was at home I spoke with the patient from my workstation phone The names of people involved in this encounter were: [name] , and [name]   Gynecology Ultrasound Follow Up  Chief Complaint: No chief complaint on file.    History of Present Illness: Patient is a 45 y.o. female who presents today for ultrasound evaluation of AUB-L s/p [place] and depo lupron.  Ultrasound demonstrates the following findgins Adnexa: no masses seen  Uterus: Good interval decrease in overall size of uterine fibroids best demonstrated in dominant fibroid decrease from 6cm to 4cm in siize with endometrial stripe without focal abnormalities Additional: no free fluid  Continues to report amenorrhea.  Some headaches since last lupron shot  Review of Systems: Review of Systems  Constitutional: Negative.   Gastrointestinal: Negative.   Genitourinary: Negative.   Neurological: Positive for headaches.    Past Medical History:  Past Medical History:  Diagnosis Date  . Anxiety   . Cervical dysplasia   . Depression   . Family history of ovarian cancer    doesn't meet BCBS Fed genetic testing guidelines.  Marland Kitchen Heart palpitations    Dr. Posey Pronto at [facility]  . Leiomyoma 2014  . Pre-diabetes   . Vitamin D deficiency 11/2015    Past Surgical History:  Past Surgical History:  Procedure Laterality Date  . COLPOSCOPY    . LEEP      Gynecologic History:  No LMP recorded.  Family History:   Family History  Problem Relation Age of Onset  . Ovarian cancer Maternal Aunt 56  . Hypertension Father   . Other Mother        fibroids/leiomyoma of uterus  . Breast cancer Cousin   . Breast cancer Cousin     Social History:  Social History   Socioeconomic History  . Marital status: Married    Spouse name: Not on file  . Number of children: Not on file  . Years of education: Not on file  . Highest education level: Not on file  Occupational History  . Not on file  Tobacco Use  . Smoking status: Former Research scientist (life sciences)  . Smokeless tobacco: Never Used  Vaping Use  . Vaping Use: Never used  Substance and Sexual Activity  . Alcohol use: Yes    Comment: occ  . Drug use: No  . Sexual activity: Yes    Birth control/protection: Pill  Other Topics Concern  . Not on file  Social History Narrative  . Not on file   Social Determinants of Health   Financial Resource Strain: Not on file  Food Insecurity: Not on file  Transportation Needs: Not on file  Physical Activity: Not on file  Stress: Not on file  Social Connections: Not on file  Intimate Partner Violence: Not on file    Allergies:  Allergies  Allergen Reactions  . Bupropion Other (See Comments)    Medications: Prior to Admission medications   Medication Sig Start Date End Date Taking? Authorizing Provider  Cholecalciferol (VITAMIN D3) 2000  units capsule Take 1 capsule by mouth daily.    [provider]  hydrochlorothiazide (MICROZIDE) 12.5 MG capsule Take 12.5 mg by mouth daily.  01/12/19 01/12/20  [provider]  leuprolide (LUPRON DEPOT, 66-MONTH,) 11.25 MG injection Inject 11.25 mg into the muscle every 3 (three) months. 01/05/20 07/03/20  [name], MD  norethindrone (AYGESTIN) 5 MG tablet Take 1 tablet (5 mg total) by mouth daily. 10/04/19   [name], MD  ferrous sulfate (FERROUSUL) 325 (65 FE) MG tablet Take 1 tablet (325 mg total) by mouth 2 (two) times daily. 10/21/19 12/29/19   [name], MD  phentermine (ADIPEX-P) 37.5 MG tablet Take 1 tablet (37.5 mg total) by mouth daily before breakfast. 12/23/19 12/29/19  [name], MD    Physical Exam Vitals: Weight 205 lb (93 kg).  No physical exam as this was a remote telephone visit to promote social distancing during the current COVID-19 Pandemic   Assessment: 45 y.o. [id number] follow up uterine fibroid  Plan: Problem List Items Addressed This Visit   None     1) Uterine fibroids - ultrasound read and images independently reviewed - significant interval decrease in size following [place] and depo lupron  - discontinue depo lupron - continue norethindrone  2) Telephone time 20:40 minutes  3) Return in about 4 weeks (around 04/04/2020) for medication follow.    [name], MD, [facility], [facility] 03/07/2020, 4:52 PM

## 2020-03-08 NOTE — Telephone Encounter (Signed)
Please advise 

## 2020-04-26 DIAGNOSIS — F331 Major depressive disorder, recurrent, moderate: Secondary | ICD-10-CM | POA: Diagnosis not present

## 2020-05-24 DIAGNOSIS — F331 Major depressive disorder, recurrent, moderate: Secondary | ICD-10-CM | POA: Diagnosis not present

## 2020-05-31 DIAGNOSIS — F331 Major depressive disorder, recurrent, moderate: Secondary | ICD-10-CM | POA: Diagnosis not present

## 2020-06-02 ENCOUNTER — Other Ambulatory Visit: Payer: Self-pay | Admitting: Obstetrics and Gynecology

## 2020-06-29 ENCOUNTER — Other Ambulatory Visit: Payer: Self-pay | Admitting: Obstetrics and Gynecology

## 2020-07-11 ENCOUNTER — Ambulatory Visit: Payer: Self-pay

## 2020-07-11 DIAGNOSIS — G4452 New daily persistent headache (NDPH): Secondary | ICD-10-CM | POA: Diagnosis not present

## 2020-07-11 DIAGNOSIS — F419 Anxiety disorder, unspecified: Secondary | ICD-10-CM | POA: Diagnosis not present

## 2020-07-11 DIAGNOSIS — F32A Depression, unspecified: Secondary | ICD-10-CM | POA: Diagnosis not present

## 2020-10-12 ENCOUNTER — Other Ambulatory Visit: Payer: Self-pay | Admitting: Obstetrics and Gynecology

## 2020-10-12 ENCOUNTER — Encounter: Payer: Self-pay | Admitting: Obstetrics and Gynecology

## 2020-10-12 DIAGNOSIS — Z1231 Encounter for screening mammogram for malignant neoplasm of breast: Secondary | ICD-10-CM

## 2020-10-12 DIAGNOSIS — D259 Leiomyoma of uterus, unspecified: Secondary | ICD-10-CM

## 2020-10-16 ENCOUNTER — Other Ambulatory Visit: Payer: Self-pay

## 2020-10-16 ENCOUNTER — Ambulatory Visit
Admission: RE | Admit: 2020-10-16 | Discharge: 2020-10-16 | Disposition: A | Discharge status: Home / Self Care | Payer: Federal, State, Local not specified - PPO | Source: Ambulatory Visit | Attending: Obstetrics and Gynecology | Admitting: Obstetrics and Gynecology

## 2020-10-16 DIAGNOSIS — Z1231 Encounter for screening mammogram for malignant neoplasm of breast: Secondary | ICD-10-CM | POA: Diagnosis not present

## 2020-10-16 IMAGING — MG MM DIGITAL SCREENING BILAT W/ TOMO AND CAD
8 series · 8 of 24 positions shown · non-contrast
Comparison: None.

CLINICAL DATA: Screening.

EXAM:
DIGITAL SCREENING BILATERAL MAMMOGRAM WITH TOMOSYNTHESIS AND CAD
TECHNIQUE: Bilateral screening digital craniocaudal and mediolateral oblique
mammograms were obtained. Bilateral screening digital breast
tomosynthesis was performed. The images were evaluated with
computer-aided detection.

[R MLO synth-2D]
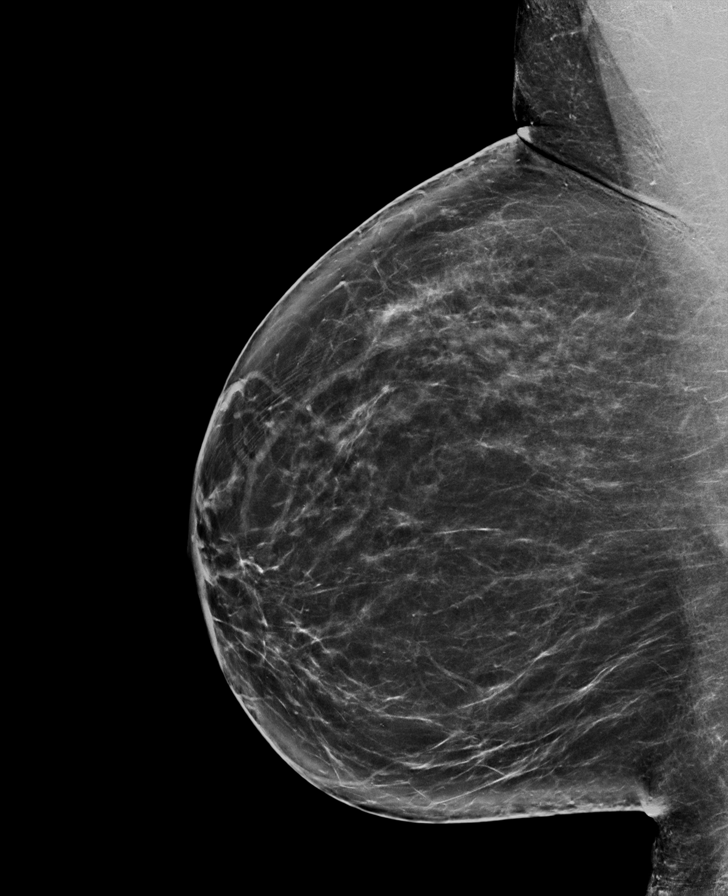

[R CC synth-2D]
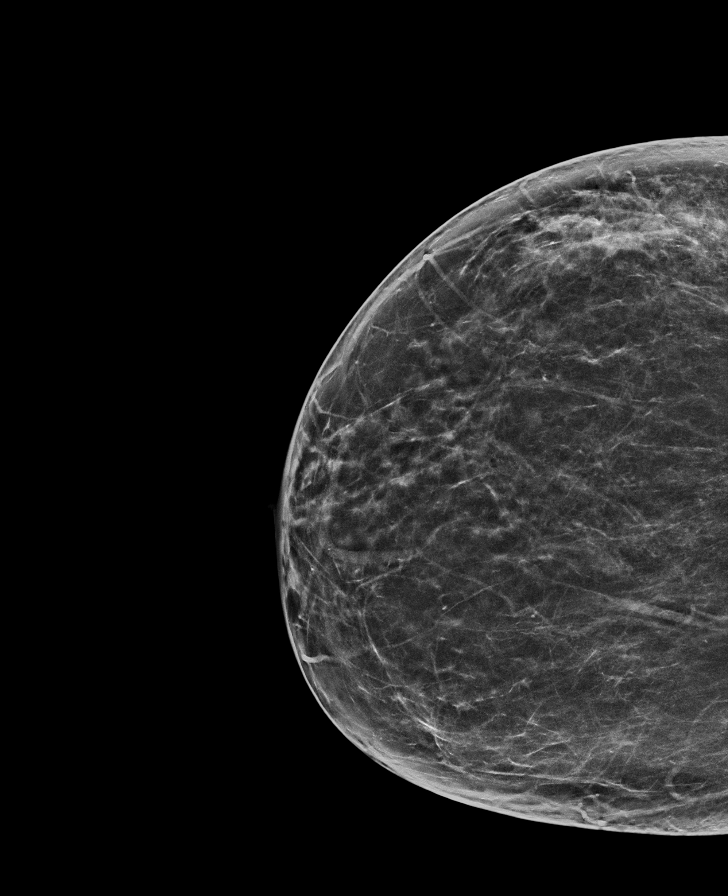

[L CC synth-2D]
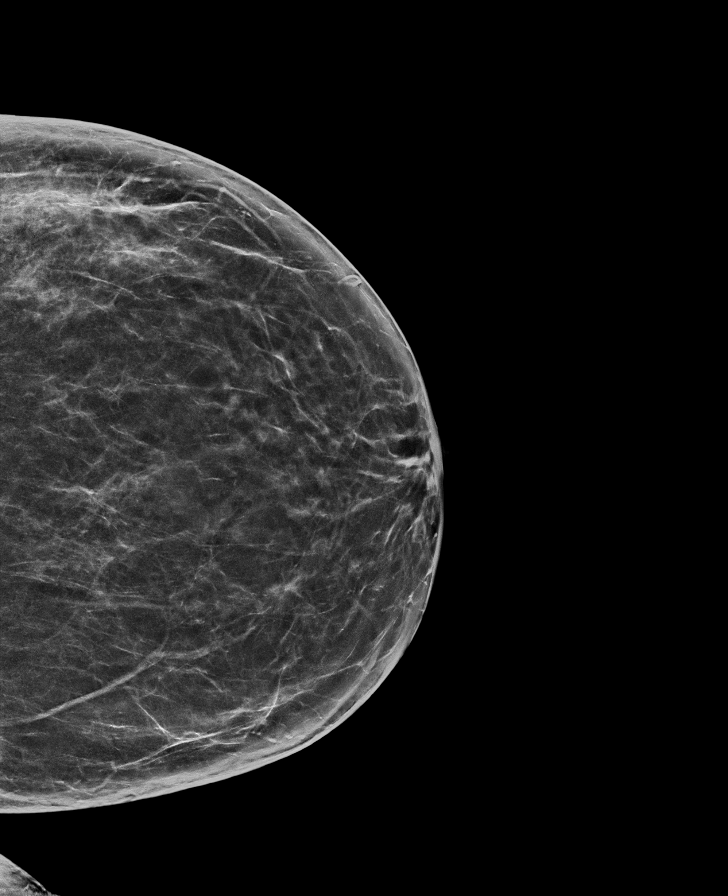

[L MLO synth-2D]
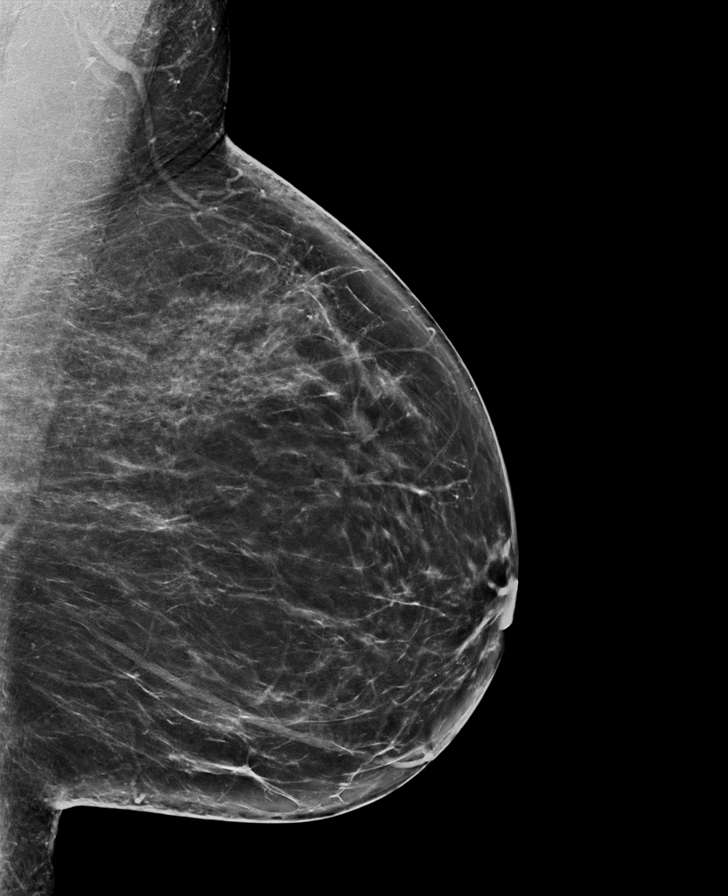

[R CC tomo · tomo slice 37/72.0]
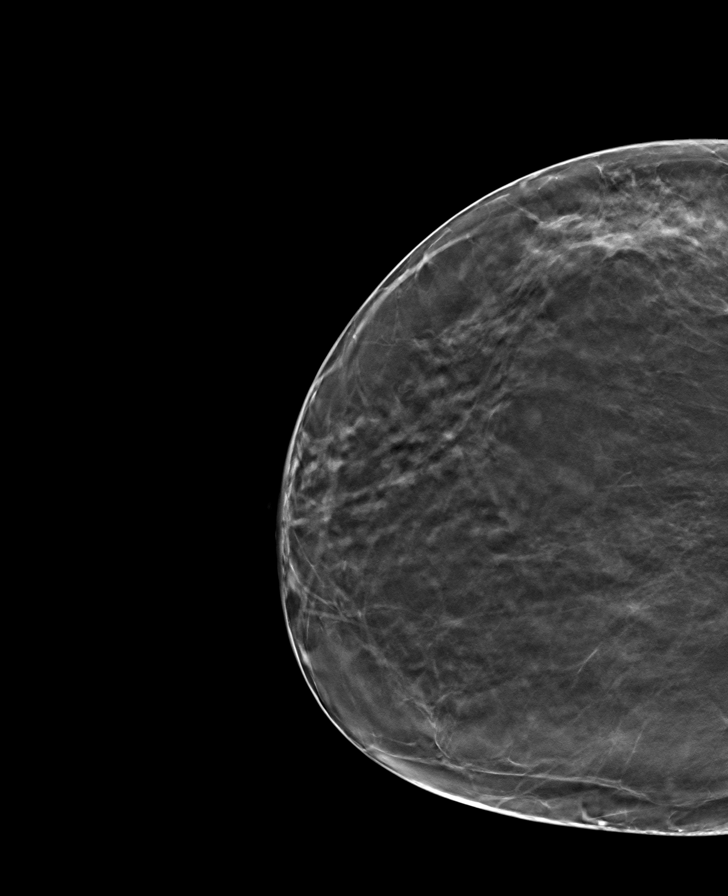

[L MLO tomo · tomo slice 42/83.0]
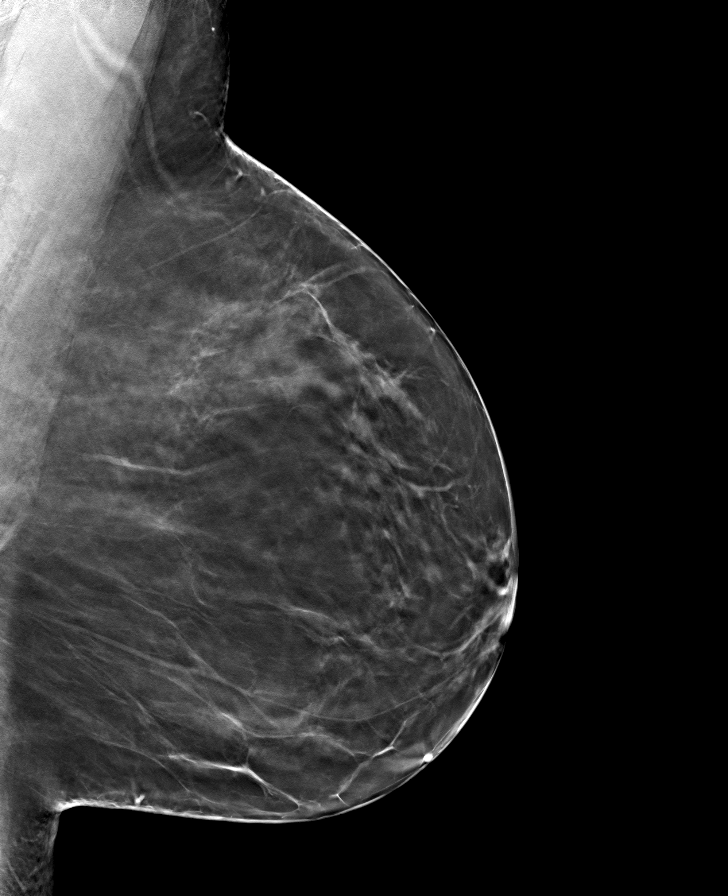

[R MLO tomo · tomo slice 43/84.0]
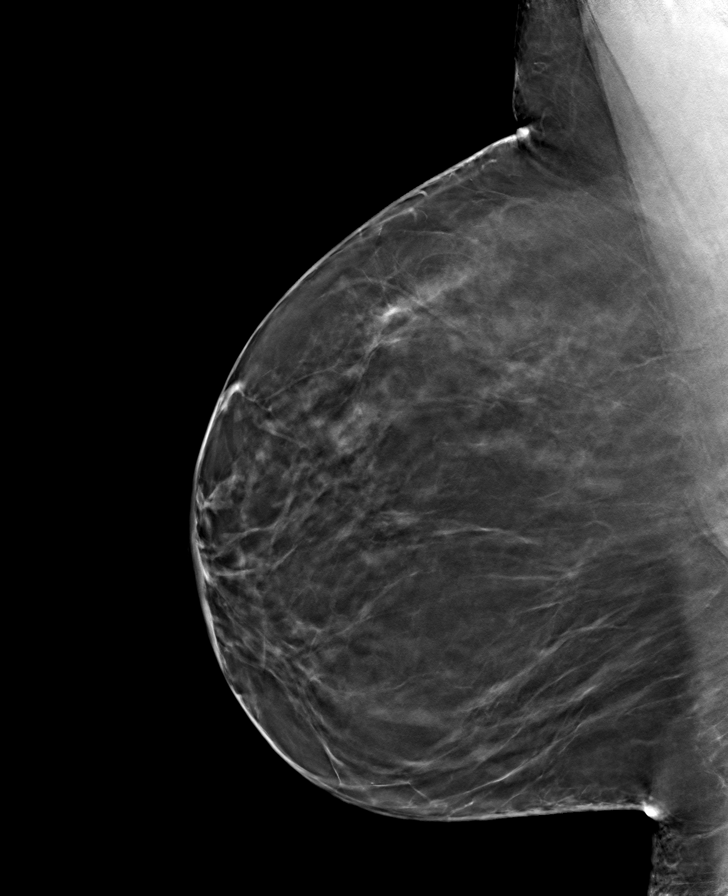

[L CC tomo · tomo slice 37/73.0]
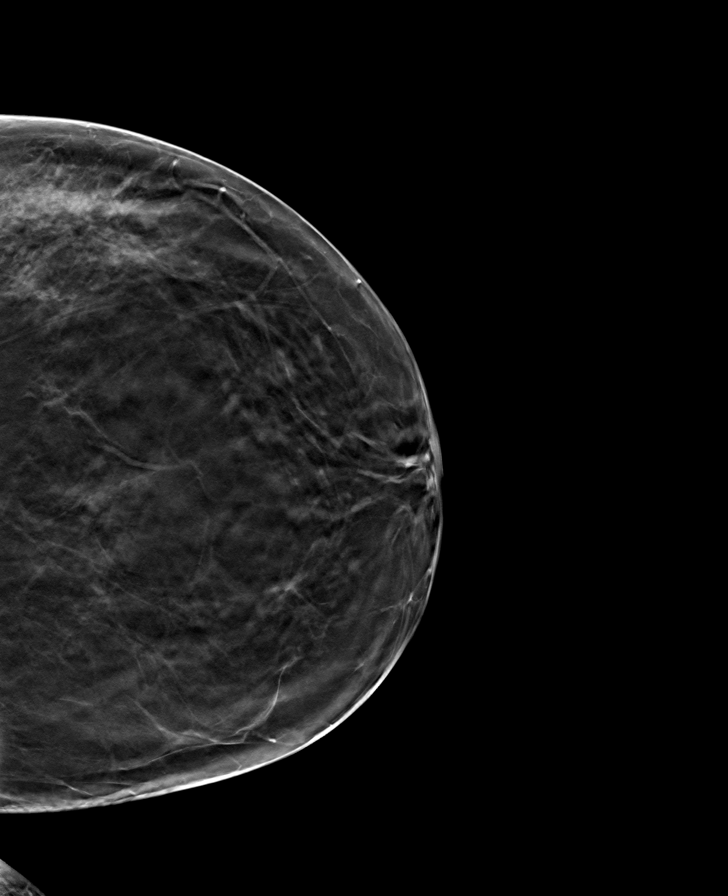

[8 of 24 positions shown; findings below may reference images not displayed]

ACR Breast Density Category b: There are scattered areas of
fibroglandular density.
FINDINGS: There are no findings suspicious for malignancy.
IMPRESSION: No mammographic evidence of malignancy. A result letter of this
screening mammogram will be mailed directly to the patient.

RECOMMENDATION:
Screening mammogram in one year. (Code:[4Q])

BI-RADS CATEGORY  1: Negative.

## 2020-10-20 ENCOUNTER — Ambulatory Visit (INDEPENDENT_AMBULATORY_CARE_PROVIDER_SITE_OTHER): Payer: Federal, State, Local not specified - PPO | Admitting: Obstetrics and Gynecology

## 2020-10-20 ENCOUNTER — Telehealth: Payer: Self-pay

## 2020-10-20 ENCOUNTER — Other Ambulatory Visit: Payer: Self-pay

## 2020-10-20 ENCOUNTER — Encounter: Payer: Self-pay | Admitting: Obstetrics and Gynecology

## 2020-10-20 ENCOUNTER — Other Ambulatory Visit (HOSPITAL_COMMUNITY)
Admission: RE | Admit: 2020-10-20 | Discharge: 2020-10-20 | Disposition: A | Discharge status: Home / Self Care | Payer: Federal, State, Local not specified - PPO | Source: Ambulatory Visit | Attending: Obstetrics and Gynecology | Admitting: Obstetrics and Gynecology

## 2020-10-20 VITALS — BP 130/80 | Ht 68.0 in | Wt 210.0 lb

## 2020-10-20 DIAGNOSIS — Z1151 Encounter for screening for human papillomavirus (HPV): Secondary | ICD-10-CM

## 2020-10-20 DIAGNOSIS — Z1231 Encounter for screening mammogram for malignant neoplasm of breast: Secondary | ICD-10-CM | POA: Diagnosis not present

## 2020-10-20 DIAGNOSIS — D500 Iron deficiency anemia secondary to blood loss (chronic): Secondary | ICD-10-CM

## 2020-10-20 DIAGNOSIS — Z124 Encounter for screening for malignant neoplasm of cervix: Secondary | ICD-10-CM

## 2020-10-20 DIAGNOSIS — Z01419 Encounter for gynecological examination (general) (routine) without abnormal findings: Secondary | ICD-10-CM | POA: Diagnosis not present

## 2020-10-20 DIAGNOSIS — Z8041 Family history of malignant neoplasm of ovary: Secondary | ICD-10-CM

## 2020-10-20 DIAGNOSIS — Z1211 Encounter for screening for malignant neoplasm of colon: Secondary | ICD-10-CM

## 2020-10-20 DIAGNOSIS — D259 Leiomyoma of uterus, unspecified: Secondary | ICD-10-CM

## 2020-10-20 DIAGNOSIS — R7309 Other abnormal glucose: Secondary | ICD-10-CM

## 2020-10-20 DIAGNOSIS — R7989 Other specified abnormal findings of blood chemistry: Secondary | ICD-10-CM

## 2020-10-20 DIAGNOSIS — Z0000 Encounter for general adult medical examination without abnormal findings: Secondary | ICD-10-CM

## 2020-10-20 NOTE — Progress Notes (Signed)
PCP:  [name], MD   Chief Complaint  Patient presents with   Gynecologic Exam    No concerns     HPI:      [name] is a 45 y.o. No obstetric history on file. who LMP was No LMP recorded. Patient has had an ablation., presents today for her annual examination.  Her menses are now absent on aygestin. Has BTB if misses pills. Was having AUB and diagnosed with 3 leio 4/21. Now s/p [place] due to [facility], was on depo lupron, changed to aygestin daily. Leio decreasing in size on last u/s; has f/u GYN u/s 10/23/20 and f/u with Dr. Georgianne Fick 10/31/20. 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 Was on [facility] prior to AUB and did well. Hx of HTN.   Sex activity: single partner, contraception - on aygestin daily. No pain/bleeding. Last Pap: 12/21/18 Results were: neg/neg HPV DNA 2017. Likes yearly paps. Hx of STDs: HPV with LEEP in past about age 89; normal paps since  Last mammogram: 10/16/20 Results were normal, repeat in 12 months.  There is a FH of breast cancer in 2 maternal cousins. There is a FH of ovarian cancer in her mat aunt, genetic testing hasn't been done because pt doesn't meet [employer] guidelines in past, but now qualifies. Pt declined genetic testing at last annual. The patient does do self-breast exams.  Tobacco use: Quit tobacco several yrs ago Alcohol use: none No drug use.  Exercise: min active  Colonoscopy: never  She does get adequate calcium but not Vitamin D in her diet.  She has a hx of enlarged thyroid, followed by PCP. Other labs with PCP, too.   Pt with hx of depression in past, mostly due to grief/loss. Struggles every yr around anniversary of her father's passing. Hard to get motivated to exercise. Doesn't feel well on SSRIs. Is seeing therapist.    Past Medical History:  Diagnosis Date   Anxiety    Cervical dysplasia    Depression    Family history of ovarian cancer    doesn't meet [employer] Fed  genetic testing guidelines.   Heart palpitations    Dr. Posey Pronto at [facility]   Leiomyoma 2014   Pre-diabetes    Vitamin D deficiency 11/2015    Past Surgical History:  Procedure Laterality Date   COLPOSCOPY     LEEP      Family History  Problem Relation Age of Onset   Ovarian cancer Maternal Aunt 25   Hypertension Father    Other Mother        fibroids/leiomyoma of uterus   Breast cancer Cousin    Breast cancer Cousin     Social History   Socioeconomic History   Marital status: Married    Spouse name: Not on file   Number of children: Not on file   Years of education: Not on file   Highest education level: Not on file  Occupational History   Not on file  Tobacco Use   Smoking status: Former   Smokeless tobacco: Never  Vaping Use   Vaping Use: Never used  Substance and Sexual Activity   Alcohol use: Yes    Comment: occ   Drug use: No   Sexual activity: Yes    Birth control/protection: Pill  Other Topics Concern   Not on file  Social History Narrative   Not on file   Social Determinants of Health  Financial Resource Strain: Not on file  Food Insecurity: Not on file  Transportation Needs: Not on file  Physical Activity: Not on file  Stress: Not on file  Social Connections: Not on file  Intimate Partner Violence: Not on file    Current Meds  Medication Sig   norethindrone (AYGESTIN) 5 MG tablet TAKE 1 TABLET(5 MG) BY MOUTH DAILY     ROS:  Review of Systems  Constitutional:  Negative for fatigue, fever and unexpected weight change.  Respiratory:  Negative for cough, shortness of breath and wheezing.   Cardiovascular:  Negative for chest pain, palpitations and leg swelling.  Gastrointestinal:  Negative for blood in stool, constipation, diarrhea, nausea and vomiting.  Endocrine: Negative for cold intolerance, heat intolerance and polyuria.  Genitourinary:  Negative for dyspareunia, dysuria, flank pain, frequency, genital sores, hematuria, menstrual  problem, pelvic pain, urgency, vaginal bleeding, vaginal discharge and vaginal pain.  Musculoskeletal:  Negative for back pain, joint swelling and myalgias.  Skin:  Negative for rash.  Neurological:  Negative for dizziness, syncope, light-headedness, numbness and headaches.  Hematological:  Negative for adenopathy.  Psychiatric/Behavioral:  Positive for dysphoric mood. Negative for agitation, confusion, sleep disturbance and suicidal ideas. The patient is not nervous/anxious.     Objective: BP 130/80   Ht 5\' 8"  (1.727 m)   Wt 210 lb (95.3 kg)   BMI 31.93 kg/m  Pt got in argument before appt. Normal BP at 9/20 appt and at Urgent care 12/20.   Physical Exam Constitutional:      Appearance: She is well-developed.  Genitourinary:     Vulva normal.     Right Labia: No rash, tenderness or lesions.    Left Labia: No tenderness, lesions or rash.    No vaginal discharge, erythema or tenderness.      Right Adnexa: not tender and no mass present.    Left Adnexa: not tender and no mass present.    No cervical motion tenderness, friability or polyp.     Uterus is not enlarged or tender.  Breasts:    Right: No mass, nipple discharge, skin change or tenderness.     Left: No mass, nipple discharge, skin change or tenderness.  Neck:     Thyroid: Thyromegaly present.  Cardiovascular:     Rate and Rhythm: Normal rate and regular rhythm.     Heart sounds: Normal heart sounds. No murmur heard. Pulmonary:     Effort: Pulmonary effort is normal.     Breath sounds: Normal breath sounds.  Abdominal:     Palpations: Abdomen is soft.     Tenderness: There is no abdominal tenderness. There is no guarding or rebound.  Musculoskeletal:        General: Normal range of motion.     Cervical back: Normal range of motion.  Lymphadenopathy:     Cervical: No cervical adenopathy.  Neurological:     General: No focal deficit present.     Mental Status: She is alert and oriented to person, place, and time.      Cranial Nerves: No cranial nerve deficit.  Skin:    General: Skin is warm and dry.  Psychiatric:        Mood and Affect: Mood normal.        Behavior: Behavior normal.        Thought Content: Thought content normal.        Judgment: Judgment normal.  Vitals reviewed.    Assessment/Plan: Encounter for annual routine gynecological examination  Cervical cancer screening - Plan: Cytology - PAP  Screening for HPV (human papillomavirus) - Plan: Cytology - PAP  Encounter for screening mammogram for malignant neoplasm of breast--pt current on mammo  Family history of ovarian cancer--MyRisk testing discussed and pt to consider. Will f/u prn.   Screening for colon cancer--colonoscopy and cologuard discussed. Pt to consider and f/u with decision next wk. Will do ref prn.  Uterine leiomyoma, unspecified location--sx improved. Has f/u Gyn u/s next wk and then f/u with Dr. Georgianne Fick. Unsure if he'll have pt cont aygestin vs change back to POPs. Will depend on u/s results   Iron deficiency anemia due to chronic blood loss - Plan: CBC with Differential/Platelet  Blood tests for routine general physical examination - Plan: Comprehensive metabolic panel, CBC with Differential/Platelet    GYN counsel breast self exam, mammography screening, adequate intake of calcium and vitamin D, diet and exercise     F/U  Return in about 1 year (around 10/20/2021).  Dermot Gremillion B. Alexio Sroka, PA-C 10/20/2020 4:58 PM

## 2020-10-20 NOTE — Patient Instructions (Signed)
I value your feedback and you entrusting us with your care. If you get a [facility] patient survey, I would appreciate you taking the time to let us know about your experience today. Thank you! ? ? ?

## 2020-10-20 NOTE — Telephone Encounter (Signed)
Patient calling for labs to please be order before today visit so that we she could have her annual labs draw at today's visit. Please advise

## 2020-10-20 NOTE — Telephone Encounter (Signed)
Pt coming early for labs. I'll let Sandy know.

## 2020-10-21 LAB — CBC WITH DIFFERENTIAL/PLATELET
Basophils Absolute: 0 10*3/uL (ref 0.0–0.2)
Basos: 1 %
EOS (ABSOLUTE): 0.1 10*3/uL (ref 0.0–0.4)
Eos: 1 %
Hematocrit: 38.5 % (ref 34.0–46.6)
Hemoglobin: 12.6 g/dL (ref 11.1–15.9)
Immature Grans (Abs): 0 10*3/uL (ref 0.0–0.1)
Immature Granulocytes: 0 %
Lymphocytes Absolute: 2 10*3/uL (ref 0.7–3.1)
Lymphs: 32 %
MCH: 26.6 pg (ref 26.6–33.0)
MCHC: 32.7 g/dL (ref 31.5–35.7)
MCV: 81 fL (ref 79–97)
Monocytes Absolute: 0.4 10*3/uL (ref 0.1–0.9)
Monocytes: 6 %
Neutrophils Absolute: 3.7 10*3/uL (ref 1.4–7.0)
Neutrophils: 60 %
Platelets: 303 10*3/uL (ref 150–450)
RBC: 4.74 x10E6/uL (ref 3.77–5.28)
RDW: 14.8 % (ref 11.7–15.4)
WBC: 6.2 10*3/uL (ref 3.4–10.8)

## 2020-10-21 LAB — COMPREHENSIVE METABOLIC PANEL
ALT: 20 IU/L (ref 0–32)
AST: 21 IU/L (ref 0–40)
Albumin/Globulin Ratio: 1.6 (ref 1.2–2.2)
Albumin: 4.5 g/dL (ref 3.8–4.8)
Alkaline Phosphatase: 56 IU/L (ref 44–121)
BUN/Creatinine Ratio: 14 (ref 9–23)
BUN: 14 mg/dL (ref 6–24)
Bilirubin Total: <0.2 mg/dL (ref 0.0–1.2)
CO2: 22 mmol/L (ref 20–29)
Calcium: 9.3 mg/dL (ref 8.7–10.2)
Chloride: 102 mmol/L (ref 96–106)
Creatinine, Ser: 1.03 mg/dL — ABNORMAL HIGH (ref 0.57–1.00)
Globulin, Total: 2.9 g/dL (ref 1.5–4.5)
Glucose: 123 mg/dL — ABNORMAL HIGH (ref 70–99)
Potassium: 3.5 mmol/L (ref 3.5–5.2)
Sodium: 139 mmol/L (ref 134–144)
Total Protein: 7.4 g/dL (ref 6.0–8.5)
eGFR: 68 mL/min/{1.73_m2} (ref >59)

## 2020-10-22 NOTE — Addendum Note (Signed)
Addended by: Ardeth Perfect B on: 35/82/5189 09:25 AM   Modules accepted: Orders

## 2020-10-23 ENCOUNTER — Other Ambulatory Visit: Payer: Self-pay

## 2020-10-23 ENCOUNTER — Ambulatory Visit
Admission: RE | Admit: 2020-10-23 | Discharge: 2020-10-23 | Disposition: A | Discharge status: Home / Self Care | Payer: Federal, State, Local not specified - PPO | Source: Ambulatory Visit | Attending: Obstetrics and Gynecology | Admitting: Obstetrics and Gynecology

## 2020-10-23 DIAGNOSIS — R7309 Other abnormal glucose: Secondary | ICD-10-CM

## 2020-10-23 DIAGNOSIS — D259 Leiomyoma of uterus, unspecified: Secondary | ICD-10-CM | POA: Insufficient documentation

## 2020-10-23 DIAGNOSIS — R7989 Other specified abnormal findings of blood chemistry: Secondary | ICD-10-CM

## 2020-10-23 IMAGING — US US PELVIS COMPLETE WITH TRANSVAGINAL
1 series · 13 of 25 positions shown · non-contrast
Comparison: [DATE]

CLINICAL DATA: Status post uterine fibroid embolization



[Series 1: us pelvis complete with transvaginal · 0.24mm/px · 13 of 61 slices shown]
[im 1/61]
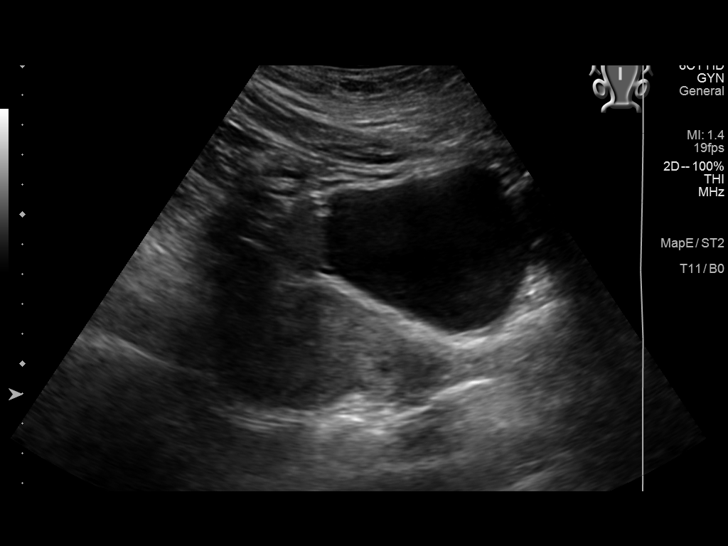
[im 6/61]
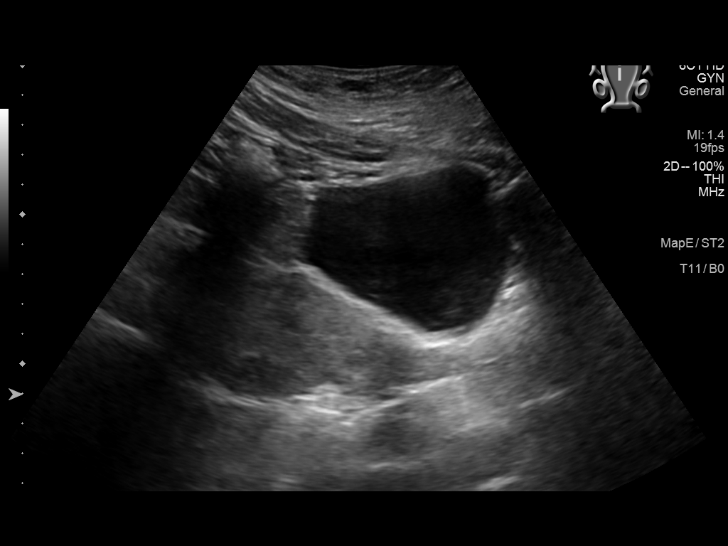
[im 11/61]
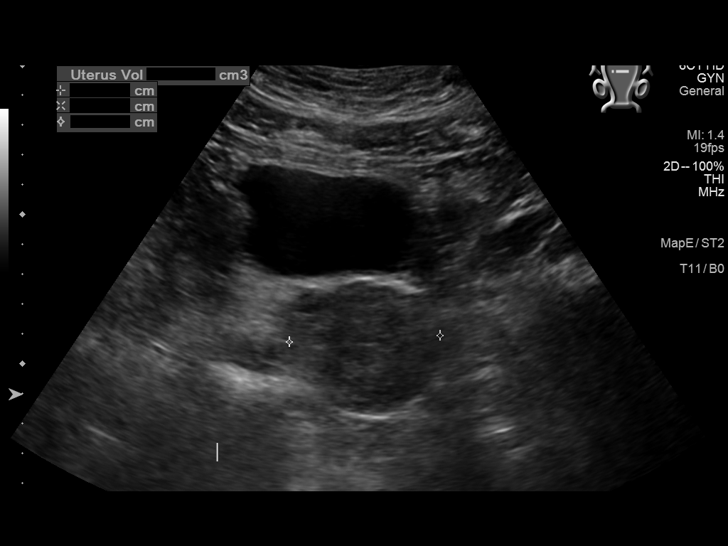
[im 16/61]
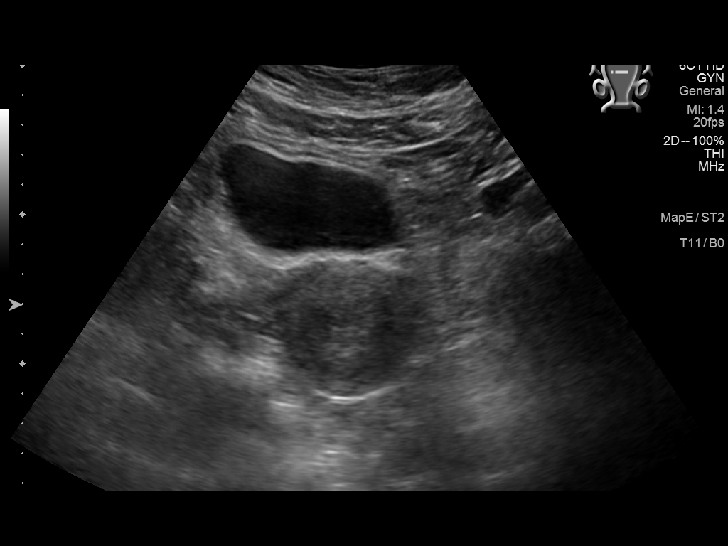
[im 21/61]
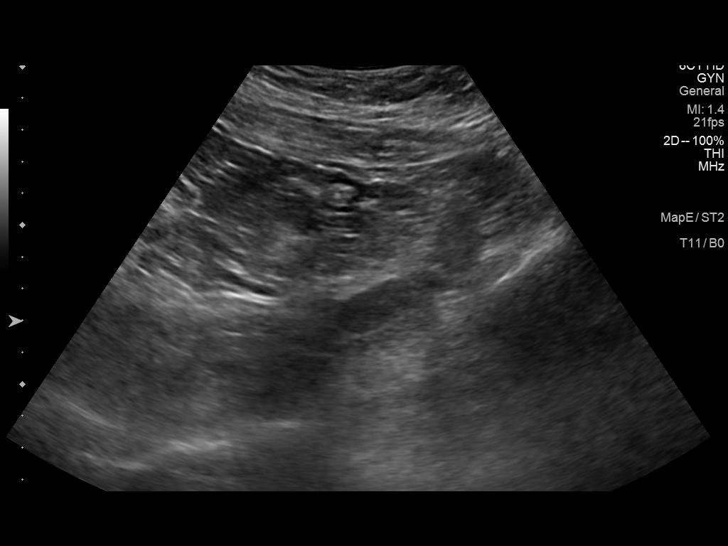
[im 26/61]
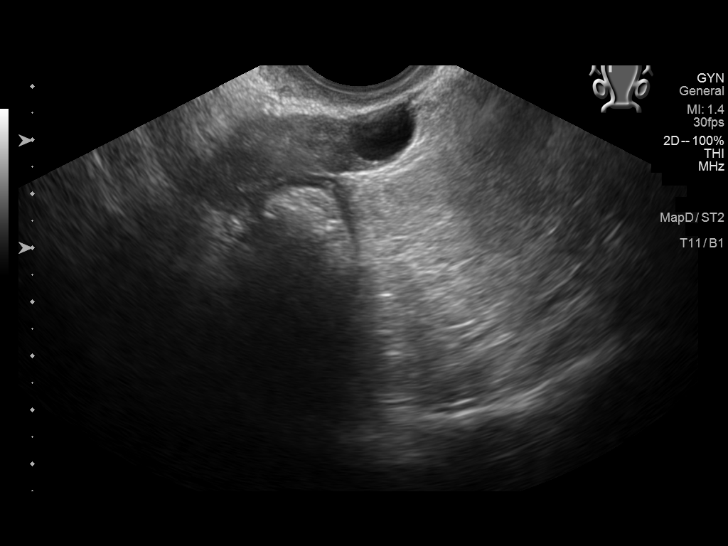
[im 31/61]
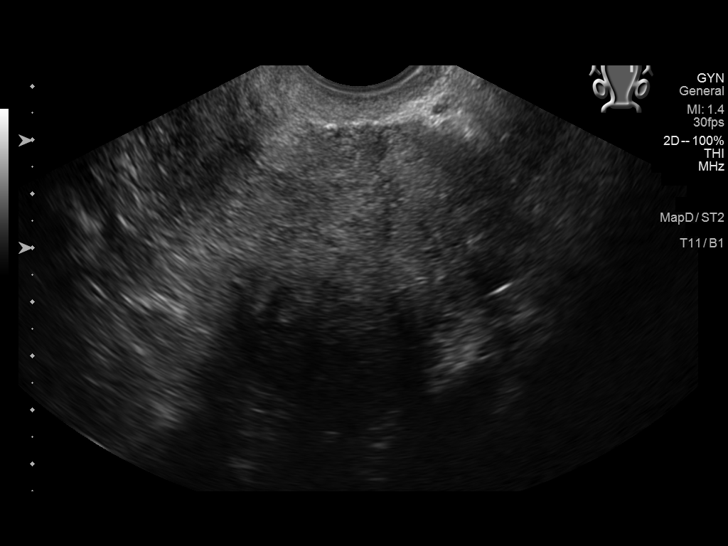
[im 36/61]
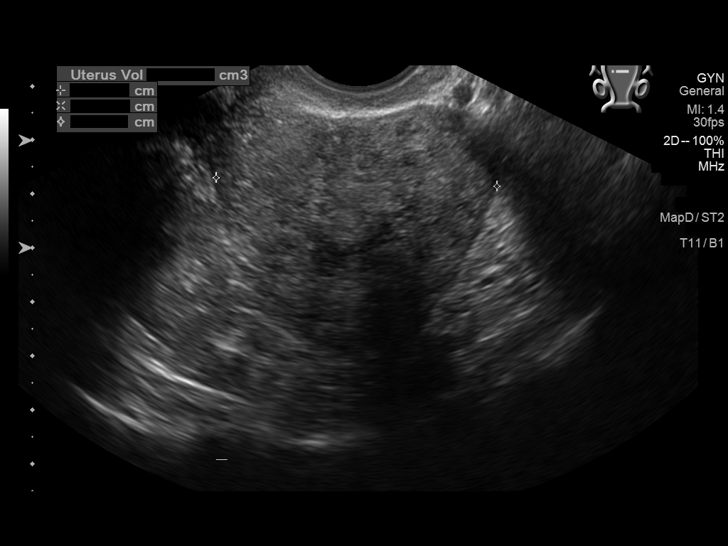
[im 41/61]
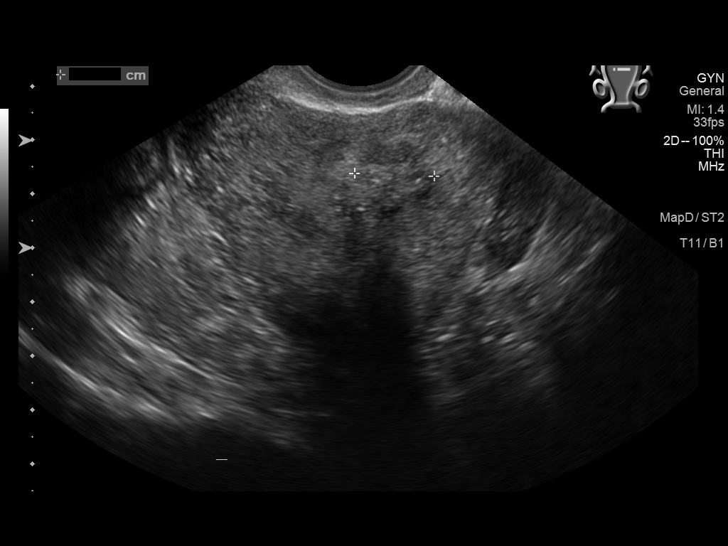
[im 46/61]
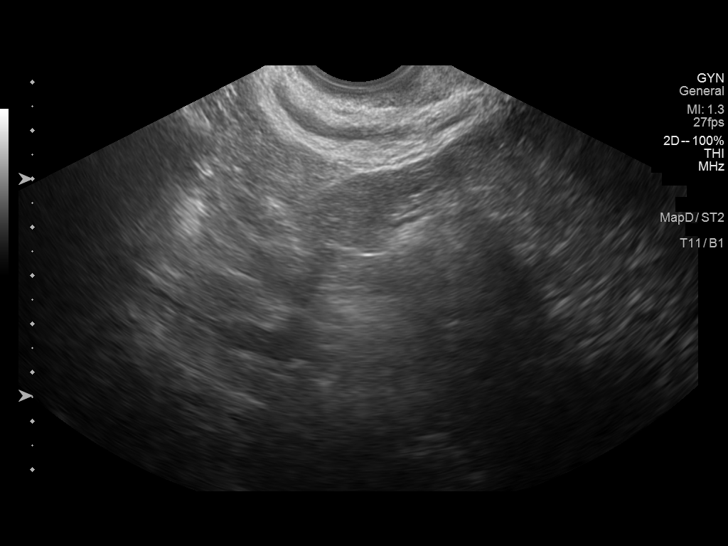
[im 51/61]
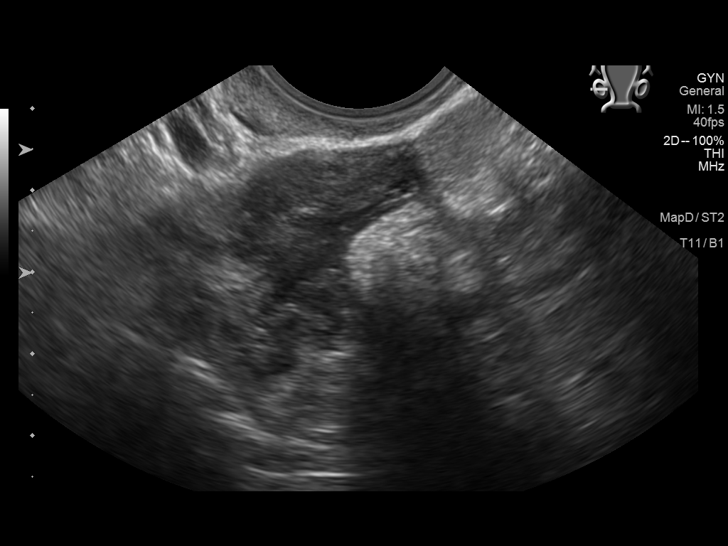
[im 56/61]
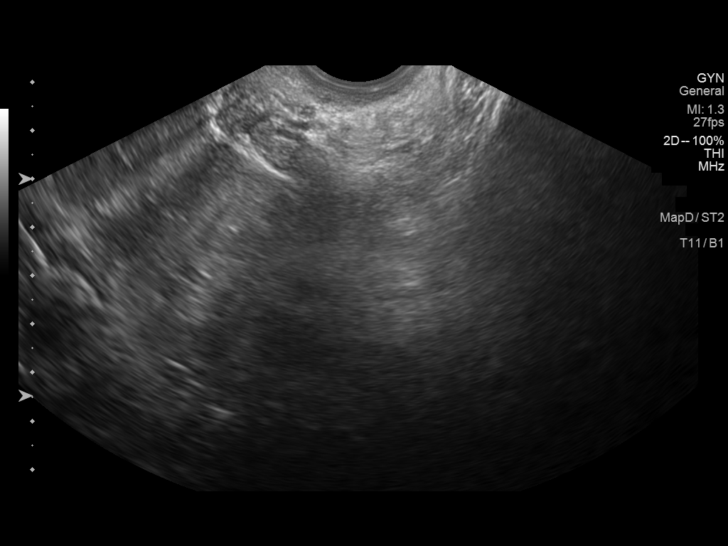
[im 61/61]
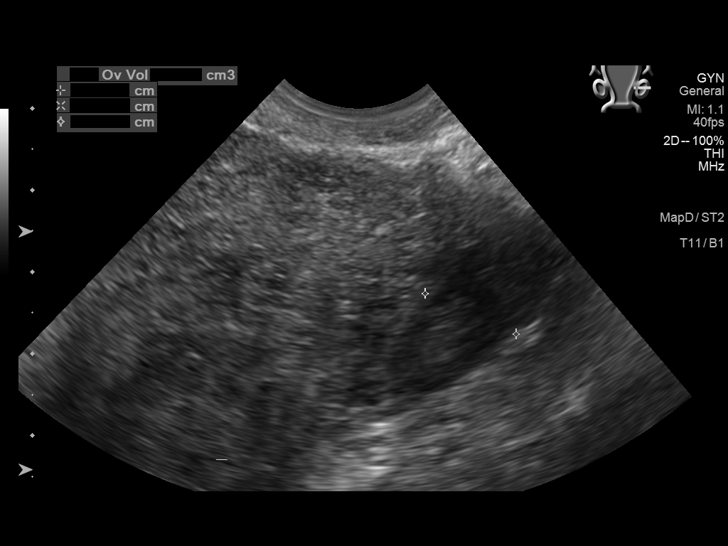

[13 of 25 positions shown; findings below may reference images not displayed]

FINDINGS: Uterus

Measurements: 8.4 x 4.8 x 5.1 cm. = volume: 106 mL. Left-sided
subserosal fundal fibroid is noted measuring 3.7 x 3.5 x 3.9 cm.
This is slightly smaller than that seen on the prior exam at which
time it measured 4.3 cm. Smaller left fundal intramural fibroid is
noted measuring 1.5 cm in greatest dimension decreased in size from
2.1 cm. No other discrete lesions are seen.

Endometrium

Thickness: 6.5 mm.  No focal abnormality visualized.

Right ovary

Measurements: 3.4 x 1.2 x 2.3 cm. = volume: 4.7 mL. Prominent
follicle is noted.

Left ovary

Measurements: 2.4 x 1.3 x 1.2 cm. = volume: 0.9 mL. Normal
appearance/no adnexal mass.

Other findings

No abnormal free fluid.
IMPRESSION: Persistent but slightly decreased uterine fibroids as described.

No other focal abnormality is noted.

## 2020-10-24 DIAGNOSIS — R7309 Other abnormal glucose: Secondary | ICD-10-CM | POA: Diagnosis not present

## 2020-10-24 DIAGNOSIS — R7989 Other specified abnormal findings of blood chemistry: Secondary | ICD-10-CM | POA: Diagnosis not present

## 2020-10-24 LAB — CYTOLOGY - PAP
Comment: NEGATIVE
Diagnosis: NEGATIVE
High risk HPV: NEGATIVE

## 2020-10-25 ENCOUNTER — Encounter: Payer: Self-pay | Admitting: Obstetrics and Gynecology

## 2020-10-25 DIAGNOSIS — R7303 Prediabetes: Secondary | ICD-10-CM

## 2020-10-25 DIAGNOSIS — F331 Major depressive disorder, recurrent, moderate: Secondary | ICD-10-CM | POA: Diagnosis not present

## 2020-10-25 DIAGNOSIS — R7989 Other specified abnormal findings of blood chemistry: Secondary | ICD-10-CM

## 2020-10-25 LAB — COMPREHENSIVE METABOLIC PANEL
ALT: 16 IU/L (ref 0–32)
AST: 14 IU/L (ref 0–40)
Albumin/Globulin Ratio: 1.2 (ref 1.2–2.2)
Albumin: 4.2 g/dL (ref 3.8–4.8)
Alkaline Phosphatase: 56 IU/L (ref 44–121)
BUN/Creatinine Ratio: 12 (ref 9–23)
BUN: 12 mg/dL (ref 6–24)
Bilirubin Total: 0.4 mg/dL (ref 0.0–1.2)
CO2: 20 mmol/L (ref 20–29)
Calcium: 9.4 mg/dL (ref 8.7–10.2)
Chloride: 103 mmol/L (ref 96–106)
Creatinine, Ser: 1.02 mg/dL — ABNORMAL HIGH (ref 0.57–1.00)
Globulin, Total: 3.5 g/dL (ref 1.5–4.5)
Glucose: 104 mg/dL — ABNORMAL HIGH (ref 70–99)
Potassium: 4 mmol/L (ref 3.5–5.2)
Sodium: 141 mmol/L (ref 134–144)
Total Protein: 7.7 g/dL (ref 6.0–8.5)
eGFR: 69 mL/min/{1.73_m2} (ref >59)

## 2020-10-25 LAB — HGB A1C W/O EAG: Hgb A1c MFr Bld: 6.2 % — ABNORMAL HIGH (ref 4.8–5.6)

## 2020-10-26 ENCOUNTER — Ambulatory Visit: Payer: Federal, State, Local not specified - PPO | Admitting: Obstetrics and Gynecology

## 2020-10-31 ENCOUNTER — Ambulatory Visit: Payer: Federal, State, Local not specified - PPO | Admitting: Obstetrics and Gynecology

## 2020-11-08 ENCOUNTER — Ambulatory Visit: Payer: Federal, State, Local not specified - PPO | Admitting: Dermatology

## 2020-11-23 ENCOUNTER — Ambulatory Visit (INDEPENDENT_AMBULATORY_CARE_PROVIDER_SITE_OTHER): Payer: Federal, State, Local not specified - PPO | Admitting: Obstetrics and Gynecology

## 2020-11-23 ENCOUNTER — Encounter: Payer: Self-pay | Admitting: Obstetrics and Gynecology

## 2020-11-23 ENCOUNTER — Other Ambulatory Visit: Payer: Self-pay

## 2020-11-23 VITALS — BP 142/82 | Ht 68.0 in | Wt 215.0 lb

## 2020-11-23 DIAGNOSIS — Z23 Encounter for immunization: Secondary | ICD-10-CM | POA: Diagnosis not present

## 2020-11-23 DIAGNOSIS — D259 Leiomyoma of uterus, unspecified: Secondary | ICD-10-CM | POA: Diagnosis not present

## 2020-11-23 DIAGNOSIS — Z6832 Body mass index (BMI) 32.0-32.9, adult: Secondary | ICD-10-CM

## 2020-11-23 DIAGNOSIS — E669 Obesity, unspecified: Secondary | ICD-10-CM | POA: Diagnosis not present

## 2020-11-23 DIAGNOSIS — R7309 Other abnormal glucose: Secondary | ICD-10-CM

## 2020-11-23 MED ORDER — NORETHINDRONE ACETATE 5 MG PO TABS: ORAL_TABLET | ORAL | 3 refills | Status: DC

## 2020-11-23 MED ORDER — PHENTERMINE HCL 37.5 MG PO TABS: 37.5000 mg | ORAL_TABLET | Freq: Every day | ORAL | 0 refills | Status: DC

## 2020-11-23 NOTE — Progress Notes (Signed)
Gynecology Ultrasound Follow Up  Chief Complaint:  Chief Complaint  Patient presents with  . Follow-up    TVUS, discuss  weight loss. RM 2     History of Present Illness: Patient is a 45 y.o. female who presents today for ultrasound evaluation of follow up post [place]   Ultrasound demonstrates the following findgins Adnexa: no adenxal masses seen Uterus: Stable to slightly further decrease in uterine fibroids with one fibroid measuring 3.7 x 3.5 x 3.9cm and a second fibroid measuring 2.1cm with endometrial stripe  34mm no focal lesion Additional: no free fluid   Patientis a 45 y.o. [id number] female, who presents for the evaluation of weight gain. She has gained 10 pounds primarily over 6 months. The patient states the following issues have contributed to her weight problem: lifestyle.  The patient has no additional symptoms. The patient specifically denies memory loss, muscle weakness, excessive thirst, and polyuria. Weight related co-morbidities include recent HgbA1C 6.2. The patient's past medical history is notable for non-contributory. She has tried phentermine interventions in the past with good success.   Review of Systems: Review of Systems  Constitutional: Negative.   Gastrointestinal: Negative.    Past Medical History:  Past Medical History:  Diagnosis Date  . Anxiety   . Cervical dysplasia   . Depression   . Family history of ovarian cancer    doesn't meet [employer] Fed genetic testing guidelines.  Marland Kitchen Heart palpitations    Dr. Posey Pronto at [facility]  . Leiomyoma 2014  . Pre-diabetes   . Vitamin D deficiency 11/2015    Past Surgical History:  Past Surgical History:  Procedure Laterality Date  . COLPOSCOPY    . LEEP      Gynecologic History:  No LMP recorded. Patient has had an ablation.   Family History:  Family History  Problem Relation Age of Onset  . Ovarian cancer Maternal Aunt 59  . Hypertension Father   . Other Mother        fibroids/leiomyoma of uterus  .  Breast cancer Cousin   . Breast cancer Cousin     Social History:  Social History   Socioeconomic History  . Marital status: Married    Spouse name: Not on file  . Number of children: Not on file  . Years of education: Not on file  . Highest education level: Not on file  Occupational History  . Not on file  Tobacco Use  . Smoking status: Former  . Smokeless tobacco: Never  Vaping Use  . Vaping Use: Never used  Substance and Sexual Activity  . Alcohol use: Yes    Comment: occ  . Drug use: No  . Sexual activity: Yes    Birth control/protection: Pill  Other Topics Concern  . Not on file  Social History Narrative  . Not on file   Social Determinants of Health   Financial Resource Strain: Not on file  Food Insecurity: Not on file  Transportation Needs: Not on file  Physical Activity: Not on file  Stress: Not on file  Social Connections: Not on file  Intimate Partner Violence: Not on file    Allergies:  Allergies  Allergen Reactions  . Bupropion Other (See Comments)    Medications: Prior to Admission medications   Medication Sig Start Date End Date Taking? Authorizing Provider  hydrochlorothiazide (MICROZIDE) 12.5 MG capsule Take 12.5 mg by mouth daily.  01/12/19 01/12/20  [provider]  norethindrone (AYGESTIN) 5 MG tablet TAKE 1 TABLET(5 MG)  BY MOUTH DAILY 06/29/20   [name], MD  ferrous sulfate (FERROUSUL) 325 (65 FE) MG tablet Take 1 tablet (325 mg total) by mouth 2 (two) times daily. 10/21/19 12/29/19  [name], MD  phentermine (ADIPEX-P) 37.5 MG tablet Take 1 tablet (37.5 mg total) by mouth daily before breakfast. 12/23/19 12/29/19  [name], MD    Physical Exam Vitals: Blood pressure (!) 142/82, height 5\' 8"  (1.727 m), weight 215 lb (97.5 kg). Body mass index is 32.69 kg/m.  General: NAD HEENT: normocephalic, anicteric Pulmonary: No increased work of breathing Extremities: no edema, erythema, or  tenderness Neurologic: Grossly intact, normal gait Psychiatric: mood appropriate, affect full   Assessment: 45 y.o. [id number] ultrasound follow up uterine fibroids, medical weight loss   Plan: Problem List Items Addressed This Visit   None Visit Diagnoses     Need for immunization against influenza    -  Primary   Relevant Orders   Flu Vaccine QUAD 92mo+IM (Fluarix, Fluzone & Alfiuria Quad PF) (Completed)   Uterine leiomyoma, unspecified location       Elevated glucose       Class 1 obesity without serious comorbidity with body mass index (BMI) of 32.0 to 32.9 in adult, unspecified obesity type       Relevant Medications   phentermine (ADIPEX-P) 37.5 MG tablet      Uterine Fibroids  1) Stable to slight interval decrease over the past year - continue norethindrone   Medical weight loss  1) 1500 Calorie ADA Diet  2) Patient education given regarding appropriate lifestyle changes for weight loss including: regular physical activity, healthy coping strategies, caloric restriction and healthy eating patterns.  3) Patient will be started on weight loss medication. The risks and benefits and side effects of medication, such as Adipex (Phenteramine) , (Diethylproprion), Belviq (lorcarsin), Contrave (buproprion/naltrexone), Qsymia (phentermine/topiramate), and Saxenda (liraglutide) is discussed. The pros and cons of suppressing appetite and boosting metabolism is discussed. Risks of tolerence and addiction is discussed for selected agents discussed. Use of medicine will ne short term, such as 3-4 months at a time followed by a period of time off of the medicine to avoid these risks and side effects for Adipex, Qsymia, and Tenuate discussed. Pt to call with any negative side effects and agrees to keep follow up appts.  4) Comorbidity Screening - hypothyroidism screening, diabetes, and hyperlipidemia screening offered - HgbA1C 6.2 on 10/24/2020 repeat at end of phentermine course  6) 15  minutes face-to-face; counseling/coordination of care > 50 percent of visit  8) Return in about 4 weeks (around 12/21/2020) for medication follow up (phone or in person).    [name], MD, [facility], [facility] 11/23/2020, 4:39 PM

## 2020-11-23 NOTE — Patient Instructions (Signed)
Magnesium citrate

## 2020-12-20 ENCOUNTER — Telehealth: Payer: Self-pay

## 2020-12-21 ENCOUNTER — Other Ambulatory Visit: Payer: Self-pay

## 2020-12-21 ENCOUNTER — Ambulatory Visit (INDEPENDENT_AMBULATORY_CARE_PROVIDER_SITE_OTHER): Payer: Federal, State, Local not specified - PPO | Admitting: Dermatology

## 2020-12-21 ENCOUNTER — Ambulatory Visit: Payer: Federal, State, Local not specified - PPO | Admitting: Obstetrics and Gynecology

## 2020-12-21 DIAGNOSIS — D485 Neoplasm of uncertain behavior of skin: Secondary | ICD-10-CM | POA: Diagnosis not present

## 2020-12-21 DIAGNOSIS — L811 Chloasma: Secondary | ICD-10-CM

## 2020-12-21 DIAGNOSIS — D492 Neoplasm of unspecified behavior of bone, soft tissue, and skin: Secondary | ICD-10-CM

## 2020-12-21 DIAGNOSIS — L820 Inflamed seborrheic keratosis: Secondary | ICD-10-CM | POA: Diagnosis not present

## 2020-12-21 DIAGNOSIS — L821 Other seborrheic keratosis: Secondary | ICD-10-CM | POA: Diagnosis not present

## 2020-12-21 NOTE — Patient Instructions (Signed)

## 2020-12-21 NOTE — Progress Notes (Signed)
New Patient Visit  Subjective  Donna Zuniga is a 45 y.o. female who presents for the following: Skin Problem (New pt here to have areas on her face checked today ). The patient has spots, moles and lesions to be evaluated, some may be new or changing and the patient has concerns that these could be cancer.   The following portions of the chart were reviewed this encounter and updated as appropriate:   Tobacco   Allergies   Meds   Problems   Med Hx   Surg Hx   Fam Hx      Review of Systems:  No other skin or systemic complaints except as noted in HPI or Assessment and Plan.  Objective  Well appearing patient in no apparent distress; mood and affect are within normal limits.  A focused examination was performed including face. Relevant physical exam findings are noted in the Assessment and Plan.  right cheek x 3, right neck x 2 Stuck-on, waxy, tan-brown papules  right anterior shoulder 0.4 cm dark brown papule        face Reticulated hyperpigmented patches.          Assessment & Plan  Inflamed seborrheic keratosis right cheek x 3, right neck x 2 Reassured benign age-related growth.  Recommend observation.  Discussed cryotherapy if spot(s) become irritated or inflamed.   Epidermal / dermal shaving - right cheek x 3, right neck x 2  Lesion diameter (cm):  0.5 Informed consent: discussed and consent obtained   Timeout: patient name, date of birth, surgical site, and procedure verified   Procedure prep:  Patient was prepped and draped in usual sterile fashion Prep type:  Isopropyl alcohol Anesthesia: the lesion was anesthetized in a standard fashion   Anesthetic:  1% lidocaine w/ epinephrine 1-100,000 buffered w/ 8.4% NaHCO3 Hemostasis achieved with: pressure, aluminum chloride and electrodesiccation   Outcome: patient tolerated procedure well   Post-procedure details: sterile dressing applied and wound care instructions given   Dressing type: bandage and  petrolatum   Additional details:  X 5   Neoplasm of skin right anterior shoulder Epidermal / dermal shaving  Lesion diameter (cm):  0.4 Informed consent: discussed and consent obtained   Patient was prepped and draped in usual sterile fashion: area prepped with alcohol. Anesthesia: the lesion was anesthetized in a standard fashion   Anesthetic:  1% lidocaine w/ epinephrine 1-100,000 buffered w/ 8.4% NaHCO3 Instrument used: flexible razor blade   Hemostasis achieved with: pressure, aluminum chloride and electrodesiccation   Outcome: patient tolerated procedure well   Post-procedure details: wound care instructions given   Post-procedure details comment:  Ointment and small bandage applied  Specimen 1 - Surgical pathology Differential Diagnosis: Irritated nevus R/O Dysplastic nevus  Check Margins: No Irritated nevus R/O Dysplastic nevus   Melasma Face See photos Start prescription Skin Medicinals Hydroquinone 8%, Tretinoin 0.1%, Kojic acid 1%, Niacinamide 4%, Fluocinolone 0.025% cream, a pea sized amount nightly to dark spots on face for up to 2 months. This cannot be used more than 3 months due to risk of skin atrophy (thinning) and exogenous ochronosis (permanent dark spots). The patient was advised this is not covered by insurance. They will receive an email to check out and the medication will be mailed to their home.   Return in about 4 months (around 04/21/2021) for Melasma .  Marta Lamas, CMA, am acting as scribe for Sarina Ser, MD .  Documentation: I have reviewed the above documentation for accuracy  and completeness, and I agree with the above.  Sarina Ser, MD

## 2020-12-25 ENCOUNTER — Telehealth: Payer: Self-pay

## 2020-12-25 NOTE — Telephone Encounter (Signed)
-----   Message from Ralene Bathe, MD sent at 12/25/2020  5:28 PM EST ----- Diagnosis Skin , right anterior shoulder SEBORRHEIC KERATOSIS, POLYPOID, IRRITATED  Benign irritated polypoid keratosis No further treatment needed.

## 2020-12-25 NOTE — Telephone Encounter (Signed)
Left message for patient to call office for results/hd 

## 2020-12-26 ENCOUNTER — Encounter: Payer: Self-pay | Admitting: Dermatology

## 2020-12-26 ENCOUNTER — Telehealth: Payer: Self-pay

## 2020-12-26 NOTE — Telephone Encounter (Signed)
Advised patient of results/hd  

## 2020-12-26 NOTE — Telephone Encounter (Signed)
-----   Message from Ralene Bathe, MD sent at 12/25/2020  5:28 PM EST ----- Diagnosis Skin , right anterior shoulder SEBORRHEIC KERATOSIS, POLYPOID, IRRITATED  Benign irritated polypoid keratosis No further treatment needed.

## 2021-01-14 ENCOUNTER — Other Ambulatory Visit: Payer: Self-pay

## 2021-01-14 ENCOUNTER — Ambulatory Visit
Admission: EM | Admit: 2021-01-14 | Discharge: 2021-01-14 | Disposition: A | Discharge status: Home / Self Care | Payer: Federal, State, Local not specified - PPO | Attending: Internal Medicine | Admitting: Internal Medicine

## 2021-01-14 ENCOUNTER — Encounter: Payer: Self-pay | Admitting: Emergency Medicine

## 2021-01-14 DIAGNOSIS — U071 COVID-19: Secondary | ICD-10-CM | POA: Diagnosis not present

## 2021-01-14 MED ORDER — PREDNISONE 50 MG PO TABS: ORAL_TABLET | ORAL | 0 refills | Status: DC

## 2021-01-14 MED ORDER — FLUTICASONE PROPIONATE 50 MCG/ACT NA SUSP: 2.0000 | Freq: Every day | NASAL | 0 refills | Status: DC

## 2021-01-14 NOTE — ED Triage Notes (Signed)
Patient states that she took positive for COVID.  Patient states that she was not able to take the Paxalovid.  Patient reports laryngitis, cough, nasal congestion that has not improved.

## 2021-01-14 NOTE — ED Provider Notes (Addendum)
[facility]    CSN: [id number] Arrival date & time: 01/14/21  1542      History   Chief Complaint Chief Complaint  Patient presents with   Cough   Nasal Congestion    HPI Donna Zuniga is a 46 y.o. female who presents with hx of URI onset 6 days ago, and tested + for covid that am. She is unable  to take the antiviral due to being on birthcontrol to control her bleeding. Her ears feels like they popping and gets intermittent sharp pains in the R. She is coughing yellow mucous. Has not been wheezing or been SOB Or had chest pain.  Quit smoking 5 years ago, and is not prone to getting bronchitis. Started loosing her voice 2 days ago. Has not had chills, and sweats due to menopause. Has been fatigued, and appetite is reduced. She is requesting an antibiotic since she had the same thing 2 years ago, but per that note then she had exudative tonsillitis. Had negative RSV and Flu test done when seen for this last week.   She denies being sick at all in the past month where she got better and then sick again.     Past Medical History:  Diagnosis Date   Anxiety    Cervical dysplasia    Depression    Family history of ovarian cancer    doesn't meet [employer] genetic testing guidelines.   Heart palpitations    Dr. Posey Pronto at [facility]   Leiomyoma 2014   Pre-diabetes    Vitamin D deficiency 11/2015    Patient Active Problem List   Diagnosis Date Noted   Iron deficiency anemia due to chronic blood loss 10/20/2020   Family history of ovarian cancer 12/17/2017   Overweight (BMI 25.0-29.9) 06/04/2017   Enlarged thyroid 12/10/2016   Nocturia 12/10/2016   Pelvic pain 09/16/2016   Leiomyoma 09/16/2016   Situational mixed anxiety and depressive disorder 07/12/2016    Past Surgical History:  Procedure Laterality Date   COLPOSCOPY     LEEP      OB History     Gravida  4   Para  2   Term  2   Preterm      AB  2   Living  2      SAB      IAB      Ectopic       Multiple      Live Births  2            Home Medications    Prior to Admission medications   Medication Sig Start Date End Date Taking? Authorizing Provider  fluticasone (FLONASE) 50 MCG/ACT nasal spray Place 2 sprays into both nostrils daily. Use this for 7 days then as needed for drainage and stuffy nose 01/14/21  Yes Rodriguez-Southworth, Sunday Spillers, PA-C  hydrochlorothiazide (MICROZIDE) 12.5 MG capsule Take 12.5 mg by mouth daily.  01/12/19 01/14/21 Yes [provider]  norethindrone (AYGESTIN) 5 MG tablet TAKE 1 TABLET(5 MG) BY MOUTH DAILY 11/23/20  Yes [name], MD  predniSONE (DELTASONE) 50 MG tablet One qd x 5 days 01/14/21  Yes Rodriguez-Southworth, Sunday Spillers, PA-C  ferrous sulfate (FERROUSUL) 325 (65 FE) MG tablet Take 1 tablet (325 mg total) by mouth 2 (two) times daily. 10/21/19 12/29/19  [name], MD    Family History Family History  Problem Relation Age of Onset   Ovarian cancer Maternal Aunt 10   Hypertension Father    Other  Mother        fibroids/leiomyoma of uterus   Breast cancer Cousin    Breast cancer Cousin     Social History Social History   Tobacco Use   Smoking status: Former   Smokeless tobacco: Never  Scientific laboratory technician Use: Never used  Substance Use Topics   Alcohol use: Yes    Comment: occ   Drug use: No     Allergies   Bupropion   Review of Systems Review of Systems  Constitutional:  Positive for appetite change, chills and fatigue. Negative for activity change, diaphoresis and fever.  HENT:  Positive for congestion and postnasal drip. Negative for ear discharge and ear pain.   Eyes:  Negative for discharge.  Respiratory:  Positive for cough. Negative for chest tightness and shortness of breath.   Cardiovascular:  Negative for chest pain.  Musculoskeletal:  Negative for myalgias.  Neurological:  Positive for headaches.  Hematological:  Negative for adenopathy.    Physical Exam Triage Vital Signs ED Triage  Vitals  Enc Vitals Group     BP 01/14/21 1553 123/84     Pulse Rate 01/14/21 1553 100     Resp 01/14/21 1553 14     Temp 01/14/21 1553 99.2 F (37.3 C)     Temp Source 01/14/21 1553 Oral     SpO2 01/14/21 1553 97 %     Weight 01/14/21 1552 214 lb 15.2 oz (97.5 kg)     Height 01/14/21 1552 5\' 8"  (1.727 m)     Head Circumference --      Peak Flow --      Pain Score 01/14/21 1552 0     Pain Loc --      Pain Edu? --      Excl. in [place]? --    No data found.  Updated Vital Signs BP 123/84 (BP Location: Right Arm)    Pulse 100    Temp 99.2 F (37.3 C) (Oral)    Resp 14    Ht 5\' 8"  (1.727 m)    Wt 214 lb 15.2 oz (97.5 kg)    SpO2 97%    BMI 32.68 kg/m   Visual Acuity Right Eye Distance:   Left Eye Distance:   Bilateral Distance:    Right Eye Near:   Left Eye Near:    Bilateral Near:     Physical Exam Physical Exam Vitals signs and nursing note reviewed.  Constitutional:      General: She is not in acute distress.    Appearance: Normal appearance. She is not ill-appearing, toxic-appearing or diaphoretic.  HENT:     Head: Normocephalic.     Right Ear: Tympanic membrane, ear canal and external ear normal.     Left Ear: Tympanic membrane is a little dull but is till gray, and ear canal and external ear normal.     Nose: Nose normal with clear mucous    Mouth/Throat: clear    Mouth: Mucous membranes are moist.  Eyes:     General: No scleral icterus.       Right eye: No discharge.        Left eye: No discharge.     Conjunctiva/sclera: Conjunctivae normal.  Neck:     Musculoskeletal: Neck supple. No neck rigidity.  Cardiovascular:     Rate and Rhythm: Normal rate and regular rhythm.     Heart sounds: No murmur.  Pulmonary:     Effort: Pulmonary effort is normal.  Breath sounds: Normal breath sounds.  Musculoskeletal: Normal range of motion.  Lymphadenopathy:     Cervical: No cervical adenopathy.  Skin:    General: Skin is warm and dry.     Coloration: Skin is not  jaundiced.     Findings: No rash.  Neurological:     Mental Status: She is alert and oriented to person, place, and time.     Gait: Gait normal.  Psychiatric:        Mood and Affect: Mood normal.        Behavior: Behavior normal.        Thought Content: Thought content normal.        Judgment: Judgment normal.    UC Treatments / Results  Labs (all labs ordered are listed, but only abnormal results are displayed) Labs Reviewed - No data to display  EKG   Radiology No results found.  Procedures Procedures (including critical care time)  Medications Ordered in UC Medications - No data to display  Initial Impression / Assessment and Plan / UC Course  I have reviewed the triage vital signs and the nursing notes. Covid infection with laryngitis I placed her on Prednisone and Flonase as noted to help with swollen vocal cords and inflammation and eustachian tube dysfunction.  I explained to her in detail her exam does not show signs of bacterial infection and antibiotics will not help this viral illness. I educated her that antibiotics her her normal microbiome and the risk of C-difficile and resistance when using antibiotics necessarily.  I reviewed that viral illnesses may last 2-3 weeks, but should get better after 10 days. If she develops a fever of 100.5 or more after 10 days, then  she may have developed a secondary infection. She was upset I could not give her an antibiotic to prevent her from getting a secondary infection. But I explained to her I cant do that, since there is no justification, and the CDC is very strict about prescribing antibiotics when there is no oblivious bacterial infection. I suggested her supplements she could take to help her immune system fight this viral infection as noted in instructions.     Final Clinical Impressions(s) / UC Diagnoses   Final diagnoses:  [id number] virus infection     Discharge Instructions      Your exam does not show any  bacterial infection today  Here are some supplements to help you fight this viral infection Take Quarcetin 500 mg three times a day x 7 days with Zinc 50 mg ones a day x 7 days. The quarcetin is an antiviral and anti-inflammatory supplement which helps open the zinc channels in the cell to absorb Zinc. Zinc helps decrease the virus load in your body. Take Melatonin 6-10 mg at bed time which also helps support your immune system.  Also make sure to take Vit D 5,000 IU per day with a fatty meal and Vit C 5000 mg a day until you are completely better. To prevent viral illnesses your vitamin D should be between 60-80. Stay on Vitamin D 2,000  and C  1000 mg the rest of the season.  Don't lay around, keep active and walk as much as you are able to to prevent worsening of your symptoms.  Follow up with your family Dr next week.  If you get short of breath and you are able to check  your oxygen with a pulse oxygen meter, if it gets to 92% or less, you need  to go to the hospital to be admitted. If you dont have one, come back here and we will assess you.       ED Prescriptions     Medication Sig Dispense Auth. Provider   predniSONE (DELTASONE) 50 MG tablet One qd x 5 days 5 tablet Rodriguez-Southworth, Samyukta Cura, PA-C   fluticasone (FLONASE) 50 MCG/ACT nasal spray Place 2 sprays into both nostrils daily. Use this for 7 days then as needed for drainage and stuffy nose 18.2 g Rodriguez-Southworth, Sunday Spillers, PA-C      PDMP not reviewed this encounter.   Shelby Mattocks, PA-C 01/14/21 1628    Rodriguez-Southworth, Sunday Spillers, PA-C 01/14/21 1636

## 2021-01-14 NOTE — Discharge Instructions (Addendum)
Your exam does not show any bacterial infection today  Here are some supplements to help you fight this viral infection Take Quarcetin 500 mg three times a day x 7 days with Zinc 50 mg ones a day x 7 days. The quarcetin is an antiviral and anti-inflammatory supplement which helps open the zinc channels in the cell to absorb Zinc. Zinc helps decrease the virus load in your body. Take Melatonin 6-10 mg at bed time which also helps support your immune system.  Also make sure to take Vit D 5,000 IU per day with a fatty meal and Vit C 5000 mg a day until you are completely better. To prevent viral illnesses your vitamin D should be between 60-80. Stay on Vitamin D 2,000  and C  1000 mg the rest of the season.  Don't lay around, keep active and walk as much as you are able to to prevent worsening of your symptoms.  Follow up with your family Dr next week.  If you get short of breath and you are able to check  your oxygen with a pulse oxygen meter, if it gets to 92% or less, you need to go to the hospital to be admitted. If you dont have one, come back here and we will assess you.

## 2021-01-22 ENCOUNTER — Encounter: Payer: Self-pay | Admitting: Obstetrics and Gynecology

## 2021-01-23 ENCOUNTER — Ambulatory Visit: Payer: Federal, State, Local not specified - PPO | Admitting: Obstetrics and Gynecology

## 2021-01-24 ENCOUNTER — Encounter: Payer: Self-pay | Admitting: Obstetrics and Gynecology

## 2021-01-24 ENCOUNTER — Ambulatory Visit (INDEPENDENT_AMBULATORY_CARE_PROVIDER_SITE_OTHER): Payer: Federal, State, Local not specified - PPO | Admitting: Obstetrics and Gynecology

## 2021-01-24 ENCOUNTER — Other Ambulatory Visit: Payer: Self-pay

## 2021-01-24 VITALS — BP 124/80 | Ht 68.0 in | Wt 218.0 lb

## 2021-01-24 DIAGNOSIS — R102 Pelvic and perineal pain: Secondary | ICD-10-CM | POA: Diagnosis not present

## 2021-01-24 DIAGNOSIS — D252 Subserosal leiomyoma of uterus: Secondary | ICD-10-CM | POA: Diagnosis not present

## 2021-01-24 DIAGNOSIS — R1031 Right lower quadrant pain: Secondary | ICD-10-CM | POA: Diagnosis not present

## 2021-01-24 DIAGNOSIS — D251 Intramural leiomyoma of uterus: Secondary | ICD-10-CM

## 2021-01-24 NOTE — Progress Notes (Incomplete)
[name], MD   Chief Complaint  Patient presents with   Pelvic Pain    Starts center of pelvic area, moves to the right side then to lower back x 1 week, denies UTI sx    HPI:      [name] is a 46 y.o. [phone] whose LMP was No LMP recorded. Patient has had an ablation., presents today for ***  Left-sided subserosal fundal fibroid is noted measuring 3.7 x 3.5 x 3.9 cm. This is slightly smaller than that seen on the prior exam at which time it measured 4.3 cm. Smaller left fundal intramural fibroid is noted measuring 1.5 cm in greatest dimension decreased in size from 2.1 cm.  10/22 Her menses are now absent on aygestin. Has BTB if misses pills. Was having AUB and diagnosed with 3 leio 4/21. Now s/p [place] due to [facility], was on depo lupron, changed to aygestin daily. Leio decreasing in size on last u/s; has f/u GYN u/s 10/23/20 and f/u with Dr. Georgianne Fick 10/31/20. 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 Was on [facility] prior to AUB and did well. Hx of HTN.   Patient Active Problem List   Diagnosis Date Noted   Iron deficiency anemia due to chronic blood loss 10/20/2020   Family history of ovarian cancer 12/17/2017   Overweight (BMI 25.0-29.9) 06/04/2017   Enlarged thyroid 12/10/2016   Nocturia 12/10/2016   Pelvic pain 09/16/2016   Leiomyoma 09/16/2016   Situational mixed anxiety and depressive disorder 07/12/2016    Past Surgical History:  Procedure Laterality Date   COLPOSCOPY     LEEP      Family History  Problem Relation Age of Onset   Ovarian cancer Maternal Aunt 62   Hypertension Father    Other Mother        fibroids/leiomyoma of uterus   Breast cancer Cousin    Breast cancer Cousin     Social History   Socioeconomic History   Marital status: Married    Spouse name: Not on file   Number of children: Not on file   Years of education: Not on file   Highest education level: Not on file   Occupational History   Not on file  Tobacco Use   Smoking status: Former   Smokeless tobacco: Never  Vaping Use   Vaping Use: Never used  Substance and Sexual Activity   Alcohol use: Yes    Comment: occ   Drug use: No   Sexual activity: Yes    Birth control/protection: Pill  Other Topics Concern   Not on file  Social History Narrative   Not on file   Social Determinants of Health   Financial Resource Strain: Not on file  Food Insecurity: Not on file  Transportation Needs: Not on file  Physical Activity: Not on file  Stress: Not on file  Social Connections: Not on file  Intimate Partner Violence: Not on file    Outpatient Medications Prior to Visit  Medication Sig Dispense Refill   norethindrone (AYGESTIN) 5 MG tablet TAKE 1 TABLET(5 MG) BY MOUTH DAILY 90 tablet 3   hydrochlorothiazide (MICROZIDE) 12.5 MG capsule Take 12.5 mg by mouth daily.      fluticasone (FLONASE) 50 MCG/ACT nasal spray Place 2 sprays into both nostrils daily. Use this for 7 days then as needed for drainage and stuffy nose 18.2 g 0   predniSONE (DELTASONE) 50 MG tablet One qd  x 5 days 5 tablet 0   No facility-administered medications prior to visit.      ROS:  Review of Systems BREAST: No symptoms   OBJECTIVE:   Vitals:  BP 124/80    Ht 5\' 8"  (1.727 m)    Wt 218 lb (98.9 kg)    BMI 33.15 kg/m   Physical Exam  Results: No results found for this or any previous visit (from the past 24 hour(s)).   Assessment/Plan: No diagnosis found.    No orders of the defined types were placed in this encounter.     No follow-ups on file.  Amazing Cowman B. Titilayo Hagans, PA-C 01/24/2021 4:08 PM

## 2021-01-24 NOTE — Progress Notes (Signed)
Donna Castle, MD   Chief Complaint  Patient presents with   Pelvic Pain    Starts center of pelvic area, moves to the right side then to lower back x 1 week, denies UTI sx    HPI:      [name] is a 46 y.o. [phone] whose LMP was No LMP recorded. Patient has had an ablation., presents today for pelvic pain for the past wk. Sx are crampy and radiated to RLQ and then to RT low back. Sx worse in AM and with walking, but then improve as day goes on. Has tried motrin with some relief temporarily. Sx started when on prednisone for cough but stopped it a week ago. No urin sx, no vag sx, no fevers. She has had some constipation.  Hx of leio, s/p [place] with resolving leio on 10/22 u/s. On aygestin daily. No bleeding but did have a little spotting today.  She is sex active, no pain/bleeding.    Patient Active Problem List   Diagnosis Date Noted   Iron deficiency anemia due to chronic blood loss 10/20/2020   Family history of ovarian cancer 12/17/2017   Overweight (BMI 25.0-29.9) 06/04/2017   Enlarged thyroid 12/10/2016   Nocturia 12/10/2016   Pelvic pain 09/16/2016   Leiomyoma 09/16/2016   Situational mixed anxiety and depressive disorder 07/12/2016    Past Surgical History:  Procedure Laterality Date   COLPOSCOPY     LEEP      Family History  Problem Relation Age of Onset   Ovarian cancer Maternal Aunt 83   Hypertension Father    Other Mother        fibroids/leiomyoma of uterus   Breast cancer Cousin    Breast cancer Cousin     Social History   Socioeconomic History   Marital status: Married    Spouse name: Not on file   Number of children: Not on file   Years of education: Not on file   Highest education level: Not on file  Occupational History   Not on file  Tobacco Use   Smoking status: Former   Smokeless tobacco: Never  Vaping Use   Vaping Use: Never used  Substance and Sexual Activity   Alcohol use: Yes    Comment: occ   Drug use: No    Sexual activity: Yes    Birth control/protection: Pill  Other Topics Concern   Not on file  Social History Narrative   Not on file   Social Determinants of Health   Financial Resource Strain: Not on file  Food Insecurity: Not on file  Transportation Needs: Not on file  Physical Activity: Not on file  Stress: Not on file  Social Connections: Not on file  Intimate Partner Violence: Not on file    Outpatient Medications Prior to Visit  Medication Sig Dispense Refill   norethindrone (AYGESTIN) 5 MG tablet TAKE 1 TABLET(5 MG) BY MOUTH DAILY 90 tablet 3   hydrochlorothiazide (MICROZIDE) 12.5 MG capsule Take 12.5 mg by mouth daily.      fluticasone (FLONASE) 50 MCG/ACT nasal spray Place 2 sprays into both nostrils daily. Use this for 7 days then as needed for drainage and stuffy nose 18.2 g 0   predniSONE (DELTASONE) 50 MG tablet One qd x 5 days 5 tablet 0   No facility-administered medications prior to visit.      ROS:  Review of Systems  Constitutional:  Negative for fever.  Gastrointestinal:  Negative for blood  in stool, constipation, diarrhea, nausea and vomiting.  Genitourinary:  Positive for pelvic pain and vaginal bleeding. Negative for dyspareunia, dysuria, flank pain, frequency, hematuria, urgency, vaginal discharge and vaginal pain.  Musculoskeletal:  Negative for back pain.  Skin:  Negative for rash.  BREAST: No symptoms   OBJECTIVE:   Vitals:  BP 124/80    Ht 5\' 8"  (1.727 m)    Wt 218 lb (98.9 kg)    BMI 33.15 kg/m   Physical Exam Vitals reviewed.  Constitutional:      Appearance: She is well-developed.  Pulmonary:     Effort: Pulmonary effort is normal.  Abdominal:     Palpations: Abdomen is soft.     Tenderness: There is abdominal tenderness in the right lower quadrant, suprapubic area and left lower quadrant. There is no guarding or rebound.  Genitourinary:    General: Normal vulva.     Pubic Area: No rash.      Labia:        Right: No rash,  tenderness or lesion.        Left: No rash, tenderness or lesion.      Vagina: Bleeding present. No vaginal discharge, erythema or tenderness.     Cervix: Normal.     Uterus: Normal. Tender. Not enlarged.      Adnexa:        Right: Tenderness present. No mass.         Left: Tenderness present. No mass.       Comments: SCANT PINKISH D/C ON VAG EXAM Musculoskeletal:        General: Normal range of motion.       Arms:     Cervical back: Normal range of motion.  Skin:    General: Skin is warm and dry.  Neurological:     General: No focal deficit present.     Mental Status: She is alert and oriented to person, place, and time.  Psychiatric:        Mood and Affect: Mood normal.        Behavior: Behavior normal.        Thought Content: Thought content normal.        Judgment: Judgment normal.    Results: Results for orders placed or performed in visit on 01/24/21 (from the past 24 hour(s))  POCT Urinalysis Dipstick     Status: Abnormal   Collection Time: 01/25/21  9:11 AM  Result Value Ref Range   Color, UA yellow    Clarity, UA clear    Glucose, UA Negative Negative   Bilirubin, UA neg    Ketones, UA neg    Spec Grav, UA 1.020 1.010 - 1.025   Blood, UA large    pH, UA 5.0 5.0 - 8.0   Protein, UA Negative Negative   Urobilinogen, UA     Nitrite, UA neg    Leukocytes, UA Trace (A) Negative   Appearance     Odor    POCT urine pregnancy     Status: Normal   Collection Time: 01/25/21  9:11 AM  Result Value Ref Range   Preg Test, Ur Negative Negative   SCANT BLEEDING ON VAG EXAM BUT BLOOD CLOT IN URINE  Assessment/Plan: Pelvic pain - Plan: POCT Urinalysis Dipstick, Urine Culture, US PELVIC COMPLETE WITH TRANSVAGINAL;  sx improved today but very tender on exam. Neg UPT, neg UA. Check C&S and GYN u/s, will f/u with results. If neg, most likely [facility]. NSAIDs/heating pad in meantime.  RLQ abdominal pain - Plan: POCT urine pregnancy, US PELVIC COMPLETE WITH TRANSVAGINAL; tender  on exam.   Intramural and subserous leiomyoma of uterus - Plan: US PELVIC COMPLETE WITH TRANSVAGINAL; resolving after [place], on aygestin.    Return if symptoms worsen or fail to improve.  Donna Khatoon B. Donna Pownall, PA-C 01/25/2021 9:16 AM

## 2021-01-25 ENCOUNTER — Encounter: Payer: Self-pay | Admitting: Obstetrics and Gynecology

## 2021-01-25 LAB — POCT URINALYSIS DIPSTICK
Bilirubin, UA: NEGATIVE
Glucose, UA: NEGATIVE
Ketones, UA: NEGATIVE
Nitrite, UA: NEGATIVE
Protein, UA: NEGATIVE
Spec Grav, UA: 1.02 (ref 1.010–1.025)
pH, UA: 5 (ref 5.0–8.0)

## 2021-01-25 LAB — POCT URINE PREGNANCY: Preg Test, Ur: NEGATIVE

## 2021-01-26 LAB — URINE CULTURE

## 2021-02-01 ENCOUNTER — Ambulatory Visit: Payer: Federal, State, Local not specified - PPO

## 2021-02-05 ENCOUNTER — Encounter: Payer: Self-pay | Admitting: Obstetrics and Gynecology

## 2021-02-05 DIAGNOSIS — Z1211 Encounter for screening for malignant neoplasm of colon: Secondary | ICD-10-CM

## 2021-02-07 ENCOUNTER — Other Ambulatory Visit: Payer: Self-pay

## 2021-02-07 ENCOUNTER — Ambulatory Visit
Admission: RE | Admit: 2021-02-07 | Discharge: 2021-02-07 | Disposition: A | Discharge status: Home / Self Care | Payer: Federal, State, Local not specified - PPO | Source: Ambulatory Visit | Attending: Obstetrics and Gynecology | Admitting: Obstetrics and Gynecology

## 2021-02-07 ENCOUNTER — Other Ambulatory Visit: Payer: Self-pay | Admitting: Obstetrics and Gynecology

## 2021-02-07 DIAGNOSIS — D251 Intramural leiomyoma of uterus: Secondary | ICD-10-CM | POA: Diagnosis present

## 2021-02-07 DIAGNOSIS — R102 Pelvic and perineal pain: Secondary | ICD-10-CM

## 2021-02-07 DIAGNOSIS — D252 Subserosal leiomyoma of uterus: Secondary | ICD-10-CM

## 2021-02-07 DIAGNOSIS — R1031 Right lower quadrant pain: Secondary | ICD-10-CM | POA: Diagnosis present

## 2021-02-07 IMAGING — US US TRANSVAGINAL NON-OB
1 series · 13 of 25 positions shown · non-contrast
Comparison: None.

CLINICAL DATA: Right lower quadrant pain.  Previous ablation.

EXAM:
ULTRASOUND PELVIS TRANSVAGINAL
TECHNIQUE: Transvaginal ultrasound examination of the pelvis was performed
including evaluation of the uterus, ovaries, adnexal regions, and
pelvic cul-de-sac.

[Series 1: us transvaginal non-ob · 0.12mm/px · 13 of 34 slices shown]
[im 1/34]
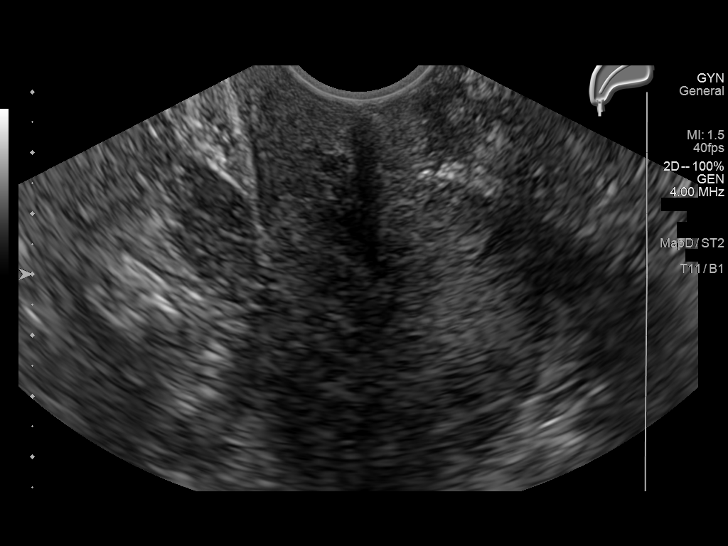
[im 3/34]
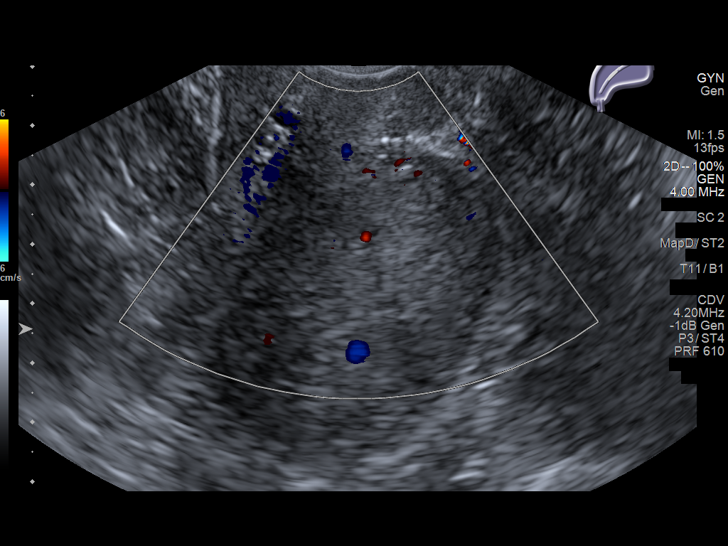
[im 6/34]
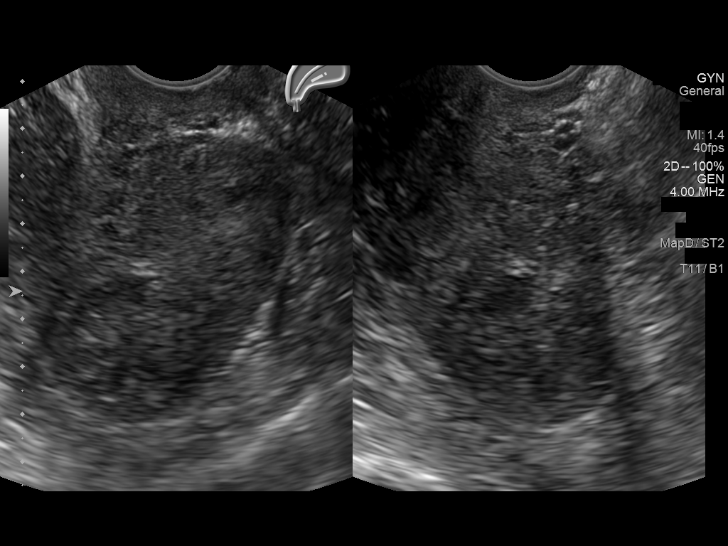
[im 9/34]
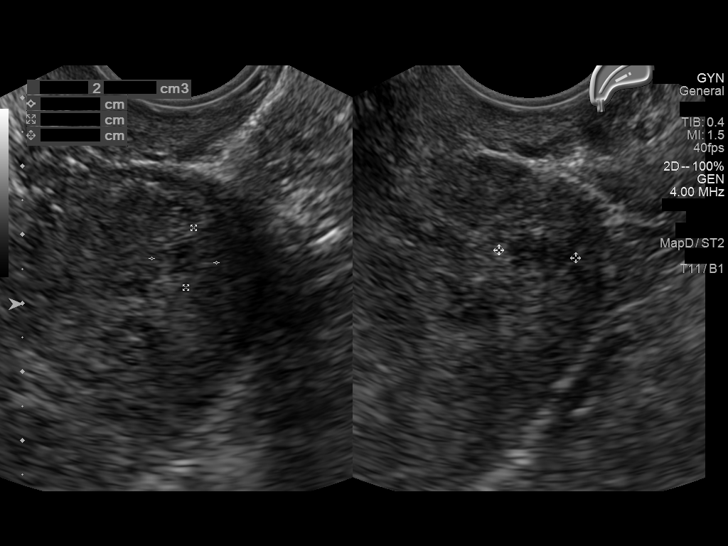
[im 12/34]
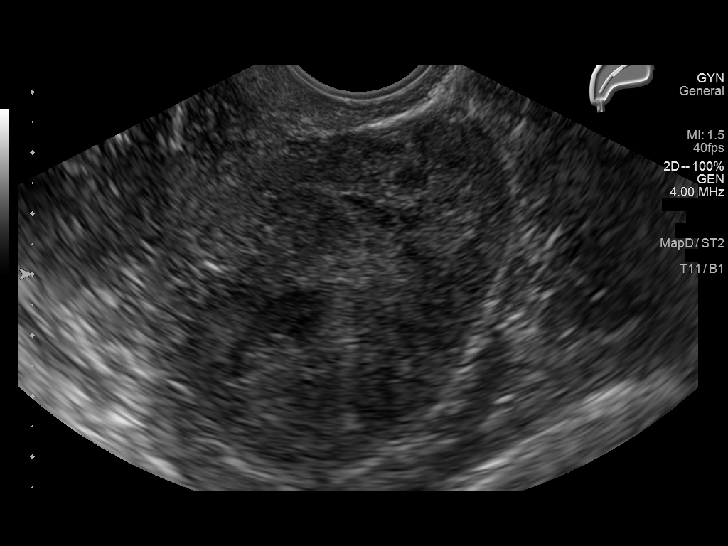
[im 14/34]
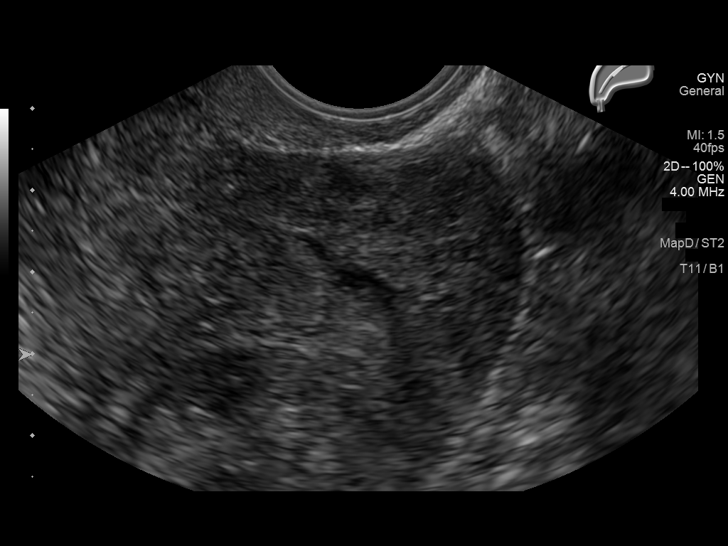
[im 17/34]
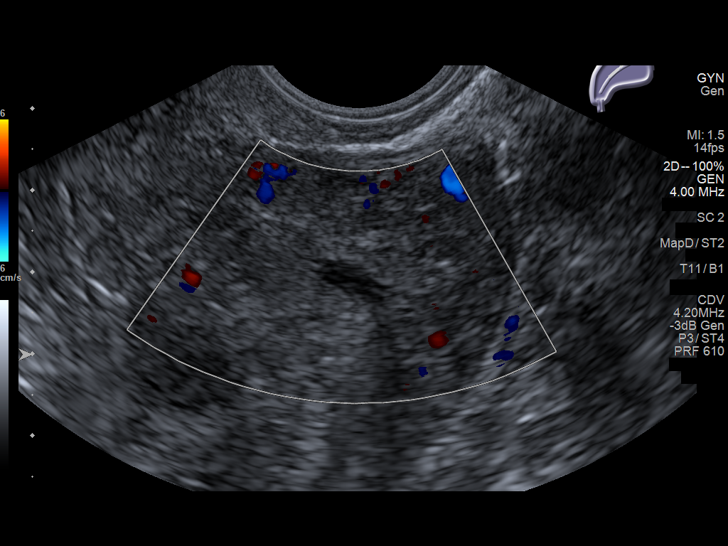
[im 20/34]
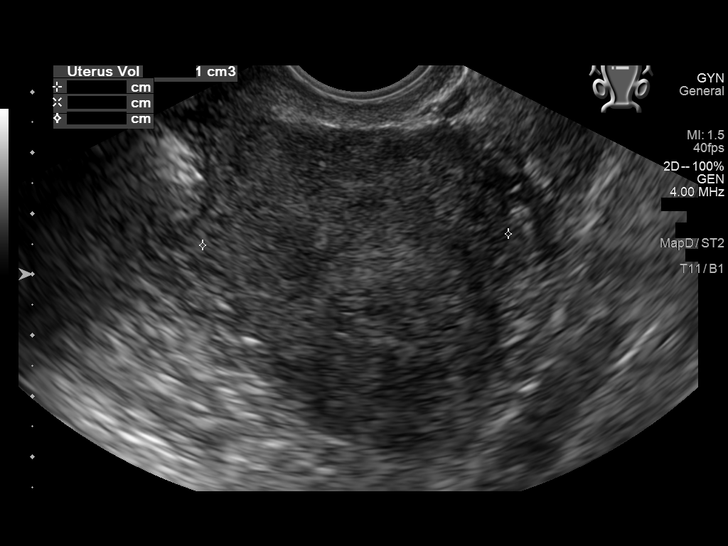
[im 23/34]
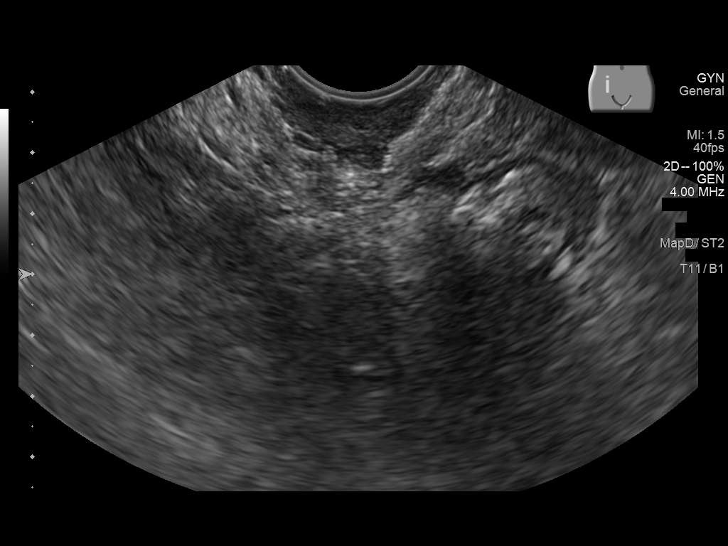
[im 25/34]
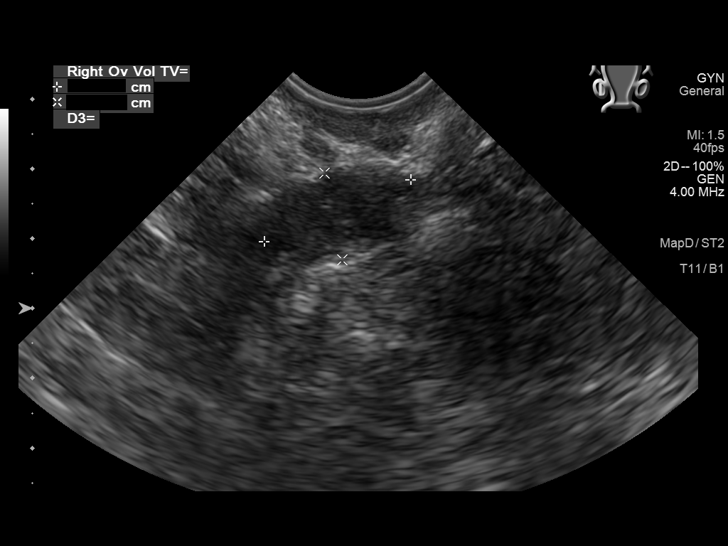
[im 28/34]
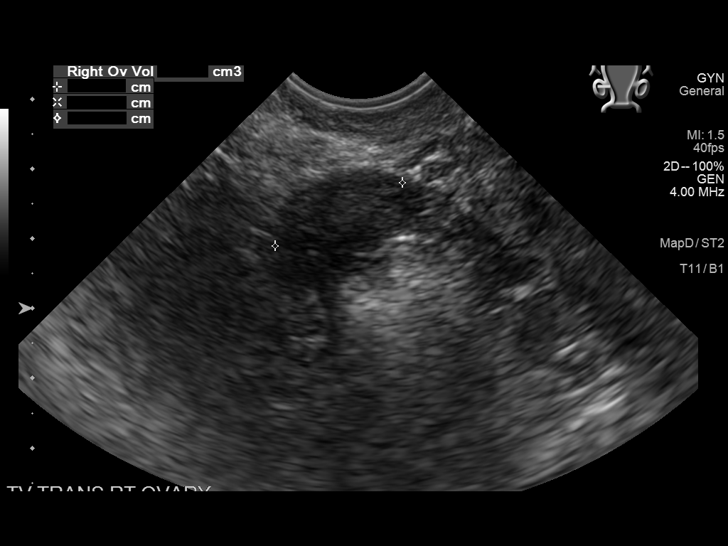
[im 31/34]
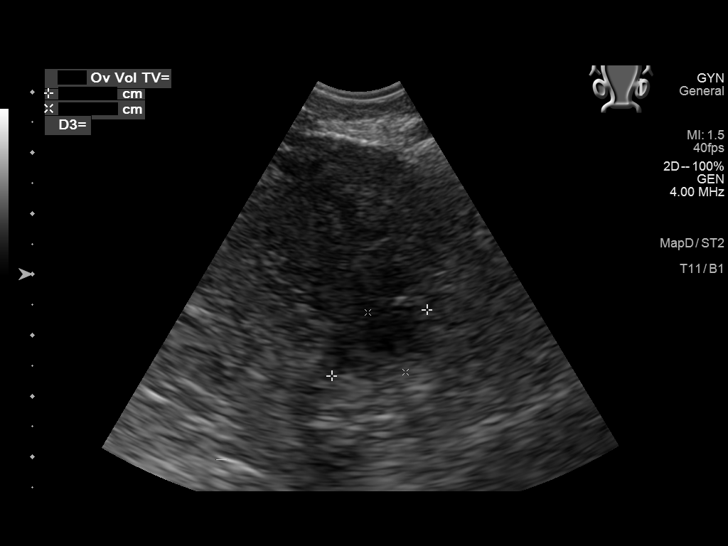
[im 34/34]
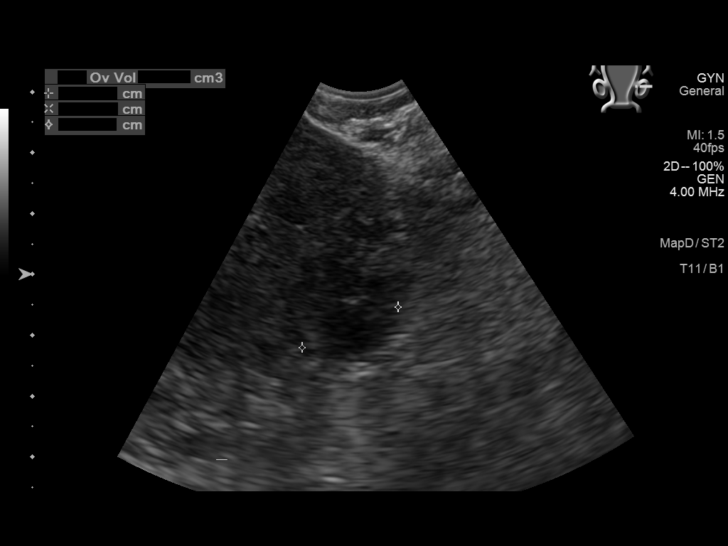

[13 of 25 positions shown; findings below may reference images not displayed]

FINDINGS: Uterus

Measurements: 5.9 x 5.3 x 5.0 cm = volume: 82 mL. The uterus is
retroflexed. A mass in the left uterine body measures 3.5 x 3.3 x
3.2 cm consistent with a fibroid. Another mass is seen in the left
fundus measuring 1.1 x 1.0 x 0.9 cm.

Endometrium

Thickness: 4.5 mm. Trace fluid in the endometrial canal. The
endometrial thickness is 4.5 mm. The interface between the
endometrium and myometrium is somewhat ill-defined.

Right ovary

Measurements: 2.3 x 1.3 x 2.0 cm = volume: 3.1 mL. Normal
appearance/no adnexal mass.

Left ovary

Measurements: 1.9 x 1.2 x 1.7 cm = volume: 2.0 mL. Normal
appearance/no adnexal mass.

Other findings:  No abnormal free fluid
IMPRESSION: 1. Two fibroids are seen in the uterus measuring up to 3.5 cm in the
left uterine body and 1.1 cm in the left fundus.
2. The endometrium is not thickened. The interface between the
endometrium and myometrium is somewhat ill-defined raising the
possibility of adenomyosis. Recommend clinical correlation.
3. Trace fluid in the endometrial canal is of uncertain etiology or
significance. Recommend clinical correlation.
4. No other abnormalities.

## 2021-02-09 ENCOUNTER — Other Ambulatory Visit: Payer: Self-pay

## 2021-02-09 ENCOUNTER — Telehealth: Payer: Self-pay

## 2021-02-09 DIAGNOSIS — Z1211 Encounter for screening for malignant neoplasm of colon: Secondary | ICD-10-CM

## 2021-02-09 MED ORDER — PEG 3350-KCL-NA BICARB-NACL 420 G PO SOLR: 4000.0000 mL | Freq: Once | ORAL | 0 refills | Status: AC

## 2021-02-09 NOTE — Progress Notes (Signed)
Gastroenterology Pre-Procedure Review  Request Date: 05/04/2021 Requesting Physician: Dr. Allen Norris  PATIENT REVIEW QUESTIONS: The patient responded to the following health history questions as indicated:    1. Are you having any GI issues? no 2. Do you have a personal history of Polyps? no 3. Do you have a family history of Colon Cancer or Polyps? no 4. Diabetes Mellitus? no 5. Joint replacements in the past 12 months?no 6. Major health problems in the past 3 months?no 7. Any artificial heart valves, MVP, or defibrillator?no   Has heart palpatations MEDICATIONS & ALLERGIES:    Patient reports the following regarding taking any anticoagulation/antiplatelet therapy:   Plavix, Coumadin, Eliquis, Xarelto, Lovenox, Pradaxa, Brilinta, or Effient? no Aspirin? no  Patient confirms/reports the following medications:  Current Outpatient Medications  Medication Sig Dispense Refill   hydrochlorothiazide (MICROZIDE) 12.5 MG capsule Take 12.5 mg by mouth daily.      norethindrone (AYGESTIN) 5 MG tablet TAKE 1 TABLET(5 MG) BY MOUTH DAILY 90 tablet 3   Semaglutide,0.25 or 0.5MG /DOS, (OZEMPIC, 0.25 OR 0.5 MG/DOSE,) 2 MG/1.5ML SOPN Inject into the skin.     No current facility-administered medications for this visit.    Patient confirms/reports the following allergies:  Allergies  Allergen Reactions   Bupropion Other (See Comments)    No orders of the defined types were placed in this encounter.   AUTHORIZATION INFORMATION Primary Insurance: 1D#: Group #:  Secondary Insurance: 1D#: Group #:  SCHEDULE INFORMATION: Date: 05/04/2021 Time: Location:[facility]

## 2021-02-09 NOTE — Telephone Encounter (Signed)
Scheduled for 05/04/2021

## 2021-03-28 ENCOUNTER — Other Ambulatory Visit: Payer: Self-pay

## 2021-03-28 ENCOUNTER — Ambulatory Visit
Admission: EM | Admit: 2021-03-28 | Discharge: 2021-03-28 | Disposition: A | Discharge status: Home / Self Care | Payer: Federal, State, Local not specified - PPO | Attending: Physician Assistant | Admitting: Physician Assistant

## 2021-03-28 DIAGNOSIS — R109 Unspecified abdominal pain: Secondary | ICD-10-CM

## 2021-03-28 DIAGNOSIS — R197 Diarrhea, unspecified: Secondary | ICD-10-CM

## 2021-03-28 LAB — URINALYSIS, ROUTINE W REFLEX MICROSCOPIC
Bilirubin Urine: NEGATIVE
Glucose, UA: NEGATIVE mg/dL
Ketones, ur: NEGATIVE mg/dL
Leukocytes,Ua: NEGATIVE
Nitrite: NEGATIVE
Protein, ur: 30 mg/dL — AB
Specific Gravity, Urine: 1.02 (ref 1.005–1.030)
pH: 7 (ref 5.0–8.0)

## 2021-03-28 LAB — URINALYSIS, MICROSCOPIC (REFLEX)

## 2021-03-28 NOTE — ED Provider Notes (Signed)
?[facility] ? ? ? ?CSN: [id number] ?Arrival date & time: 03/28/21  1241 ? ? ?  ? ?History   ?Chief Complaint ?Chief Complaint  ?Patient presents with  ? Abdominal Pain  ? ? ?HPI ?Donna Zuniga is a 46 y.o. female presenting for 2 to 3-day history of diffuse abdominal cramping and loose stools.  Patient says stool is the color of "tumeric."  Reports 5-6 episodes of diarrhea yesterday and about 3-4 today so far.  Patient says she has not eaten today.  She denies any associated fever.  Has been somewhat fatigued and believes she may be dehydrated.  Patient recently returned home from [place] yesterday.  She was there for 5 days.  States she stayed at a resort.  Denies ever eating any street food.  She says her husband has had some abdominal cramping as well.  Patient reports she has been taking probiotics.  She has not taken any other OTC meds for her symptoms.  No vomiting.  Patient does report doing a home COVID test and states it was negative.  She has not had any URI symptoms.  No urinary symptoms either.  No other complaints. ? ?HPI ? ?Past Medical History:  ?Diagnosis Date  ? Anxiety   ? Cervical dysplasia   ? Depression   ? Family history of ovarian cancer   ? doesn't meet BCBS Fed genetic testing guidelines.  ? Heart palpitations   ? Dr. Posey Pronto at [facility]  ? Leiomyoma 2014  ? Pre-diabetes   ? Vitamin D deficiency 11/2015  ? ? ?Patient Active Problem List  ? Diagnosis Date Noted  ? Iron deficiency anemia due to chronic blood loss 10/20/2020  ? COVID-19 12/28/2019  ? Hypertensive disorder 03/31/2019  ? Exposure to severe acute respiratory syndrome coronavirus 2 (SARS-CoV-2) 03/31/2019  ? Family history of ovarian cancer 12/17/2017  ? Overweight (BMI 25.0-29.9) 06/04/2017  ? Enlarged thyroid 12/10/2016  ? Nocturia 12/10/2016  ? Pelvic pain 09/16/2016  ? Uterine leiomyoma 09/16/2016  ? Situational mixed anxiety and depressive disorder 07/12/2016  ? ? ?Past Surgical History:  ?Procedure Laterality Date   ? COLPOSCOPY    ? LEEP    ? ? ?OB History   ? ? Gravida  ?4  ? Para  ?2  ? Term  ?2  ? Preterm  ?   ? AB  ?2  ? Living  ?2  ?  ? ? SAB  ?   ? IAB  ?   ? Ectopic  ?   ? Multiple  ?   ? Live Births  ?2  ?   ?  ?  ? ? ? ?Home Medications   ? ?Prior to Admission medications   ?Medication Sig Start Date End Date Taking? Authorizing Provider  ?azelastine (OPTIVAR) 0.05 % ophthalmic solution Apply to eye. 03/08/21 03/08/22 Yes [provider]  ?fluticasone (FLONASE) 50 MCG/ACT nasal spray USE 2 SPRAYS IN EACH NOSTRIL DAILY. USE THIS FOR 7 DAYS THEN AS NEEDED FOR DRAINAGE AND STUFFY NOSE 01/14/21  Yes [provider]  ?hydrochlorothiazide (MICROZIDE) 12.5 MG capsule Take by mouth. 03/07/21  Yes [provider]  ?Insulin Pen Needle (PEN NEEDLES 31GX5/16") 31G X 8 MM MISC See admin instructions. 03/07/21 03/07/22 Yes [provider]  ?norethindrone (AYGESTIN) 5 MG tablet TAKE 1 TABLET(5 MG) BY MOUTH DAILY 11/23/20  Yes [name], MD  ?predniSONE (DELTASONE) 50 MG tablet TAKE 1 TABLET BY MOUTH EVERY DAY FOR 5 DAYS 01/14/21  Yes [provider]  ?Semaglutide,0.25 or 0.'5MG'$ /DOS, (OZEMPIC, 0.25 OR 0.5 MG/DOSE,) 2 MG/1.5ML SOPN Inject into the skin. 01/25/21  Yes [provider]  ?hydrochlorothiazide (HYDRODIURIL) 12.5 MG tablet Take 12.5 mg by mouth daily. 01/24/21   [provider]  ?ferrous sulfate (FERROUSUL) 325 (65 FE) MG tablet Take 1 tablet (325 mg total) by mouth 2 (two) times daily. 10/21/19 02/09/21  [name], MD  ? ? ?Family History ?Family History  ?Problem Relation Age of Onset  ? Ovarian cancer Maternal Aunt 82  ? Hypertension Father   ? Other Mother   ?     fibroids/leiomyoma of uterus  ? Breast cancer Cousin   ? Breast cancer Cousin   ? ? ?Social History ?Social History  ? ?Tobacco Use  ? Smoking status: Former  ? Smokeless tobacco: Never  ?Vaping Use  ? Vaping Use: Never used  ?Substance Use Topics  ? Alcohol use: Yes  ?  Comment: occ  ? Drug use: No   ? ? ? ?Allergies   ?Bupropion ? ? ?Review of Systems ?Review of Systems  ?Constitutional:  Positive for appetite change and fatigue. Negative for fever.  ?Respiratory:  Negative for shortness of breath.   ?Cardiovascular:  Negative for chest pain.  ?Gastrointestinal:  Positive for abdominal pain and nausea. Negative for abdominal distention, blood in stool, diarrhea and vomiting.  ?Genitourinary:  Negative for difficulty urinating, dysuria, flank pain, frequency, pelvic pain, urgency and vaginal discharge.  ?Musculoskeletal:  Negative for back pain.  ?Neurological:  Negative for weakness.  ? ? ?Physical Exam ?Triage Vital Signs ?ED Triage Vitals  ?Enc Vitals Group  ?   BP 03/28/21 1301 137/90  ?   Pulse Rate 03/28/21 1301 84  ?   Resp 03/28/21 1301 18  ?   Temp 03/28/21 1301 98.7 ?F (37.1 ?C)  ?   Temp Source 03/28/21 1301 Oral  ?   SpO2 03/28/21 1301 100 %  ?   Weight 03/28/21 1257 207 lb (93.9 kg)  ?   Height 03/28/21 1257 '5\' 8"'$  (1.727 m)  ?   Head Circumference --   ?   Peak Flow --   ?   Pain Score 03/28/21 1253 0  ?   Pain Loc --   ?   Pain Edu? --   ?   Excl. in [place]? --   ? ?No data found. ? ?Updated Vital Signs ?BP 137/90 (BP Location: Left Arm)   Pulse 84   Temp 98.7 ?F (37.1 ?C) (Oral)   Resp 18   Ht '5\' 8"'$  (1.727 m)   Wt 207 lb (93.9 kg)   LMP  (LMP Unknown)   SpO2 100%   BMI 31.47 kg/m?  ?  ? ?Physical Exam ?Vitals and nursing note reviewed.  ?Constitutional:   ?   General: She is not in acute distress. ?   Appearance: Normal appearance. She is not ill-appearing or toxic-appearing.  ?HENT:  ?   Head: Normocephalic and atraumatic.  ?   Nose: Nose normal.  ?   Mouth/Throat:  ?   Mouth: Mucous membranes are moist.  ?   Pharynx: Oropharynx is clear.  ?Eyes:  ?   General: No scleral icterus.    ?   Right eye: No discharge.     ?   Left eye: No discharge.  ?   Conjunctiva/sclera: Conjunctivae normal.  ?Cardiovascular:  ?   Rate and Rhythm: Normal rate and regular rhythm.  ?   Heart sounds: Normal heart  sounds.  ?Pulmonary:  ?   Effort: Pulmonary effort is normal. No respiratory distress.  ?   Breath sounds: Normal breath sounds.  ?Abdominal:  ?   General: Bowel sounds are normal.  ?   Palpations: Abdomen is soft.  ?   Tenderness: There is abdominal tenderness (mild periumbilical). There is no right CVA tenderness, left CVA tenderness, guarding or rebound.  ?Musculoskeletal:  ?   Cervical back: Neck supple.  ?Skin: ?   General: Skin is dry.  ?Neurological:  ?   General: No focal deficit present.  ?   Mental Status: She is alert. Mental status is at baseline.  ?   Motor: No weakness.  ?   Gait: Gait normal.  ?Psychiatric:     ?   Mood and Affect: Mood normal.     ?   Behavior: Behavior normal.     ?   Thought Content: Thought content normal.  ? ? ? ?UC Treatments / Results  ?Labs ?(all labs ordered are listed, but only abnormal results are displayed) ?Labs Reviewed  ?URINALYSIS, ROUTINE W REFLEX MICROSCOPIC - Abnormal; Notable for the following components:  ?    Result Value  ? APPearance HAZY (*)   ? Hgb urine dipstick MODERATE (*)   ? Protein, ur 30 (*)   ? All other components within normal limits  ?URINALYSIS, MICROSCOPIC (REFLEX) - Abnormal; Notable for the following components:  ? Bacteria, UA FEW (*)   ? All other components within normal limits  ? ? ?EKG ? ? ?Radiology ?No results found. ? ?Procedures ?Procedures (including critical care time) ? ?Medications Ordered in UC ?Medications - No data to display ? ?Initial Impression / Assessment and Plan / UC Course  ?I have reviewed the triage vital signs and the nursing notes. ? ?Pertinent labs & imaging results that were available during my care of the patient were reviewed by me and considered in my medical decision making (see chart for details). ? ?46 year old female presenting for 2 to 3-day history of abdominal cramping, reduced appetite, nausea and 3-5 episodes of diarrhea per day.  Rep[place].  Vitals all normal and stable.   Patient is overall well-appearing.  She has normal bowel sounds and a soft abdomen with mild tenderness palpation of the periumbilical region.  No guarding or rebound.  Chest clear to auscultation.  Discussed w

## 2021-03-28 NOTE — Discharge Instructions (Signed)
-  You may have traveler's diarrhea.  This should be self resolving over a few days.  I expect you to feel better in the next 3 days but if you do not or you develop a fever or increased episodes of diarrhea, you should return to pick up cups to leave a stool sample so that we can assess your stool for bacteria and viruses. ?- See the handout on food choices to help relieve diarrhea.  Make sure you are increasing your fluid intake.  Consider Pedialyte. ?

## 2021-03-28 NOTE — ED Triage Notes (Addendum)
Patient is here for "Abd Pain". Recent Travel, To [place] (16th through 21st). "Loose stool/diarreha, since Tuesday". No nausea. No vomiting "Stomach pain is cramps":. Recent COVID19 testing "Negative". Also, Urine is "very yellow".  ?

## 2021-04-20 ENCOUNTER — Emergency Department
Admission: EM | Admit: 2021-04-20 | Discharge: 2021-04-20 | Disposition: A | Discharge status: Home / Self Care | Payer: Federal, State, Local not specified - PPO | Attending: Emergency Medicine | Admitting: Emergency Medicine

## 2021-04-20 ENCOUNTER — Emergency Department: Payer: Federal, State, Local not specified - PPO

## 2021-04-20 ENCOUNTER — Encounter: Payer: Self-pay | Admitting: Emergency Medicine

## 2021-04-20 DIAGNOSIS — Z8616 Personal history of COVID-19: Secondary | ICD-10-CM | POA: Diagnosis not present

## 2021-04-20 DIAGNOSIS — R42 Dizziness and giddiness: Secondary | ICD-10-CM

## 2021-04-20 LAB — BASIC METABOLIC PANEL
Anion gap: 7 (ref 5–15)
BUN: 11 mg/dL (ref 6–20)
CO2: 27 mmol/L (ref 22–32)
Calcium: 8.9 mg/dL (ref 8.9–10.3)
Chloride: 104 mmol/L (ref 98–111)
Creatinine, Ser: 0.9 mg/dL (ref 0.44–1.00)
GFR, Estimated: >60 mL/min (ref >60)
Glucose, Bld: 98 mg/dL (ref 70–99)
Potassium: 3.2 mmol/L — ABNORMAL LOW (ref 3.5–5.1)
Sodium: 138 mmol/L (ref 135–145)

## 2021-04-20 LAB — URINALYSIS, ROUTINE W REFLEX MICROSCOPIC
Bacteria, UA: NONE SEEN
Bilirubin Urine: NEGATIVE
Glucose, UA: NEGATIVE mg/dL
Ketones, ur: NEGATIVE mg/dL
Leukocytes,Ua: NEGATIVE
Nitrite: NEGATIVE
Protein, ur: NEGATIVE mg/dL
Specific Gravity, Urine: 1.015 (ref 1.005–1.030)
pH: 5 (ref 5.0–8.0)

## 2021-04-20 LAB — CBC
HCT: 40 % (ref 36.0–46.0)
Hemoglobin: 12.9 g/dL (ref 12.0–15.0)
MCH: 27.2 pg (ref 26.0–34.0)
MCHC: 32.3 g/dL (ref 30.0–36.0)
MCV: 84.2 fL (ref 80.0–100.0)
Platelets: 304 10*3/uL (ref 150–400)
RBC: 4.75 MIL/uL (ref 3.87–5.11)
RDW: 13.7 % (ref 11.5–15.5)
WBC: 6.2 10*3/uL (ref 4.0–10.5)
nRBC: 0 % (ref 0.0–0.2)

## 2021-04-20 LAB — POC URINE PREG, ED
Preg Test, Ur: NEGATIVE
Preg Test, Ur: NEGATIVE

## 2021-04-20 LAB — TROPONIN I (HIGH SENSITIVITY): Troponin I (High Sensitivity): 4 ng/L (ref <18)

## 2021-04-20 IMAGING — CT CT ANGIO HEAD-NECK (W OR W/O PERF)
3 of 11 series · 9 of 34 positions shown · IV contrast (APPLIED)
Comparison: Brain MRI earlier today which was substantially
limited, largely nondiagnostic due to hair piece associated
susceptibility artifact.

CLINICAL DATA: 45-year-old female with dizziness for 1 week.
Vertigo. Vertical nystagmus.

EXAM:
CT ANGIOGRAPHY HEAD AND NECK
TECHNIQUE: Multidetector CT imaging of the head and neck was performed using
the standard protocol during bolus administration of intravenous
contrast. Multiplanar CT image reconstructions and MIPs were
obtained to evaluate the vascular anatomy. Carotid stenosis
measurements (when applicable) are obtained utilizing NASCET
criteria, using the distal internal carotid diameter as the
denominator.

[Series 8: sagittal soft tissue · sagittal · 0.39mm/px · 1 of 54 slices shown]
[im 23/54  soft-tissue]
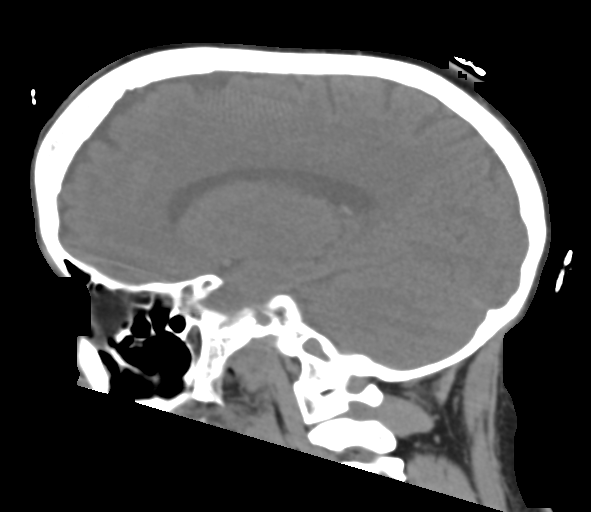

[Series 9: cta head neck · axial · 0.50mm/px · z∈[-422,-80]mm · 3 of 172 slices shown]
[im 1/172  soft-tissue]
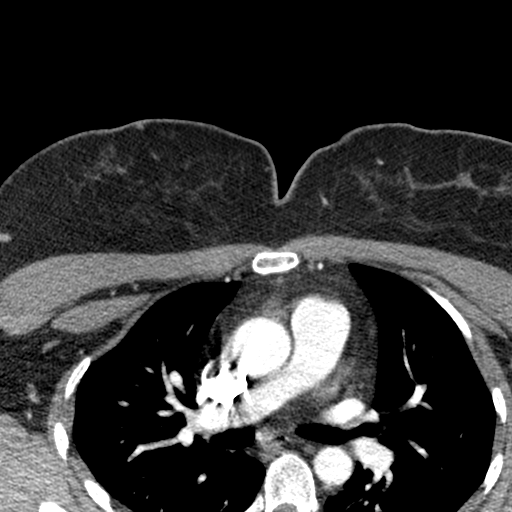
[im 86/172  bone]
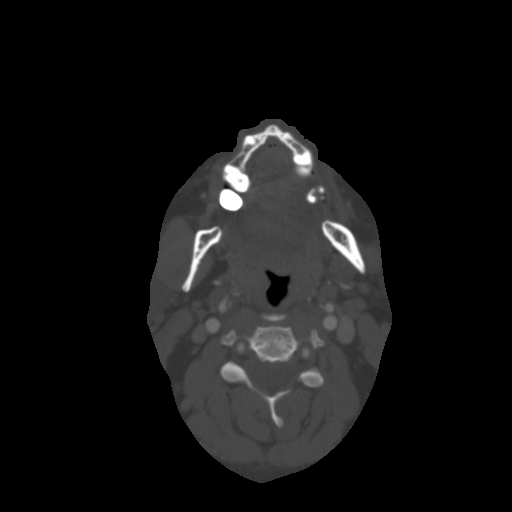
[im 172/172  soft-tissue]
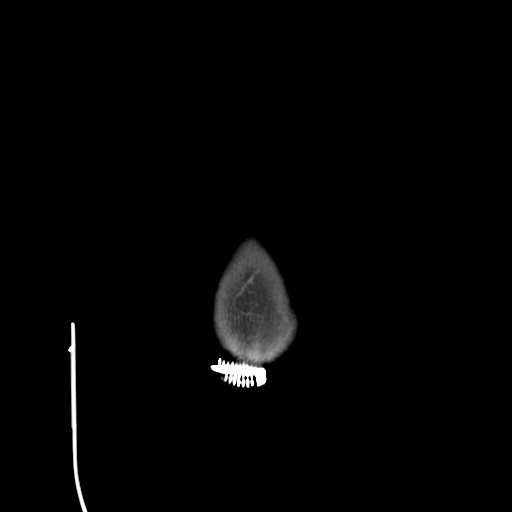

[Series 11: ax thin · axial · 0.46mm/px · z∈[-362,-136]mm · 5 of 340 slices shown]
[im 57/340  soft-tissue]
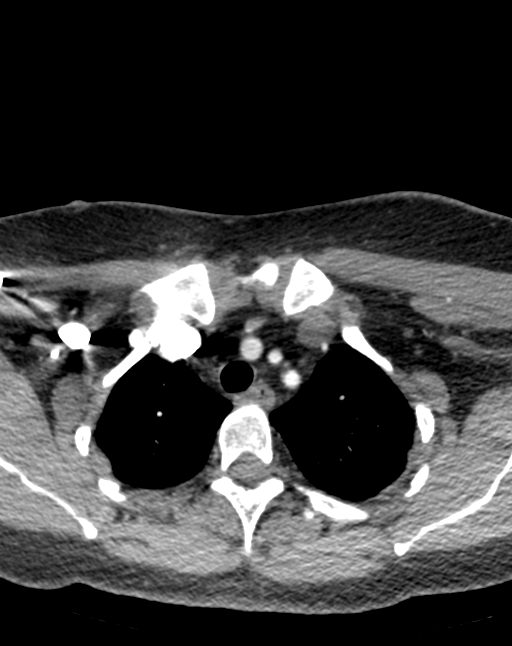
[im 114/340  soft-tissue]
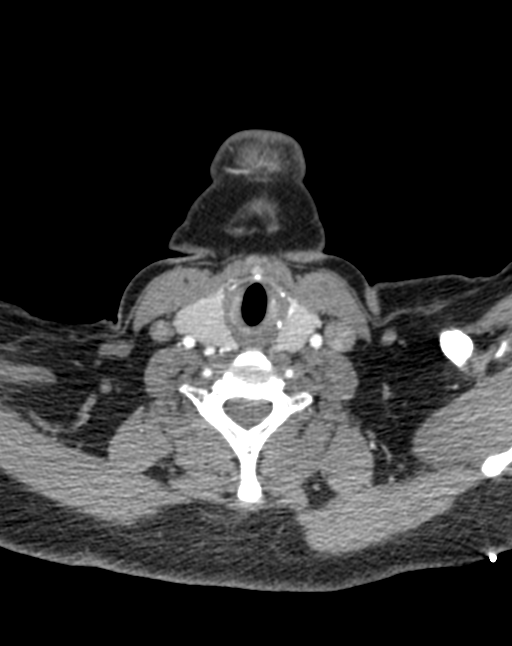
[im 170/340  soft-tissue]
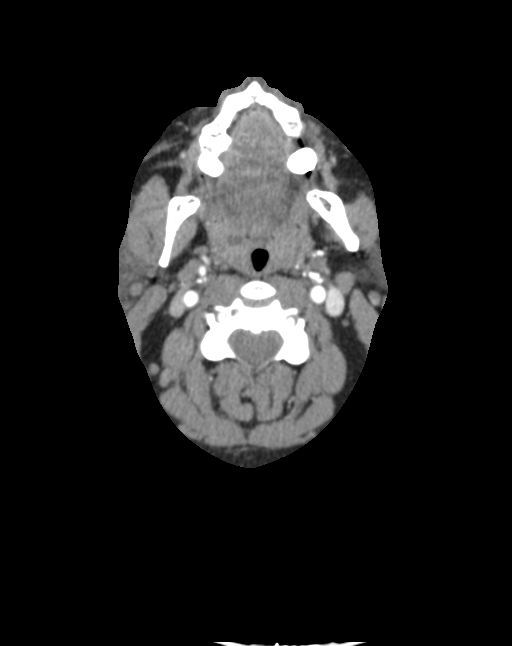
[im 227/340  soft-tissue]
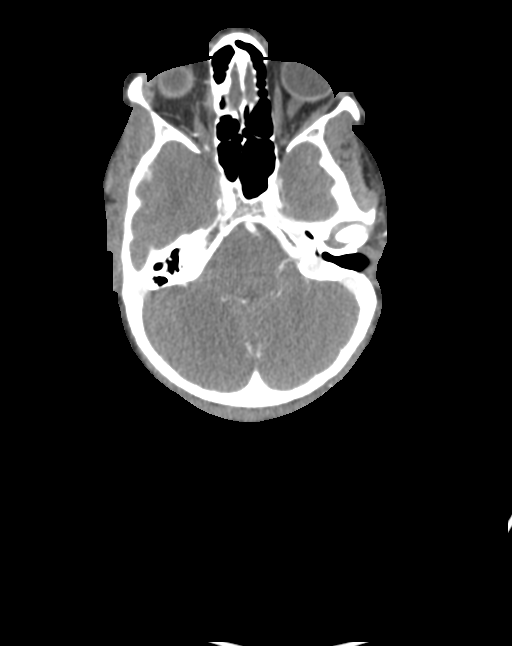
[im 283/340  soft-tissue]
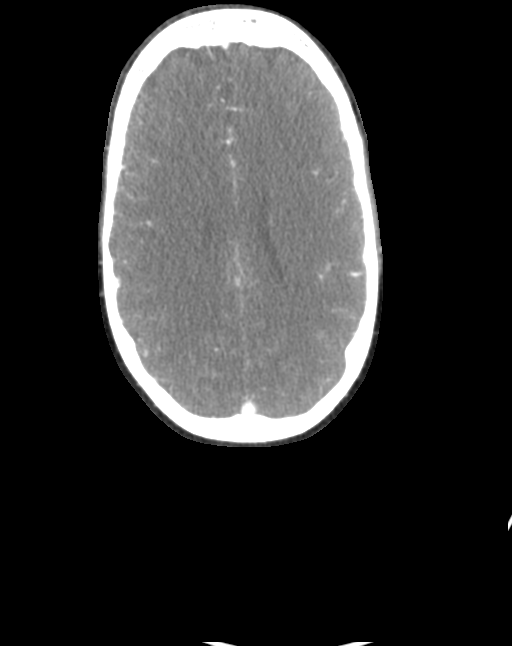

[9 of 34 positions shown; findings below may reference images not displayed]

RADIATION DOSE REDUCTION: This exam was performed according to the
departmental dose-optimization program which includes automated
exposure control, adjustment of the mA and/or kV according to
patient size and/or use of iterative reconstruction technique.

CONTRAST:  75mL OMNIPAQUE IOHEXOL 350 MG/ML SOLN
FINDINGS: CT HEAD

Brain: Cerebral volume is within normal limits. No midline shift,
ventriculomegaly, mass effect, evidence of mass lesion, intracranial
hemorrhage or evidence of cortically based acute infarction.
Gray-white matter differentiation is within normal limits throughout
the brain.

Calvarium and skull base: Fairly pronounced Scaphocephaly. No acute
osseous abnormality identified.

Paranasal sinuses: Tympanic cavities and mastoids are clear.
Paranasal sinuses are clear.

Orbits: Minimal scalp artifact associated with the hair piece.
Visualized orbits and scalp soft tissues are within normal limits.

CTA NECK

Skeleton: Reversal of cervical lordosis. Anterior cervical endplate
spurring but otherwise mild cervical spine degeneration. No acute
osseous abnormality identified.

Upper chest: Negative.

Other neck: Mild thyromegaly. Only subcentimeter hypodense left
thyroid nodules , not clinically significant; no follow-up imaging
recommended (ref: [HOSPITAL]. [DATE]):
143-50).Otherwise negative.

Aortic arch: Bovine arch configuration.  No arch atherosclerosis.

Right carotid system: Brachiocephalic artery and right CCA origin
are normal. Negative right CCA, right carotid bifurcation, and
cervical right ICA.

Left carotid system: Negative.

Vertebral arteries:
Normal proximal right subclavian artery and right vertebral artery
origin. Right vertebral artery appears patent and normal to the
skull base.

Normal proximal left subclavian artery and left vertebral artery
origin. Codominant left vertebral artery is patent and within normal
limits to the skull base.

CTA HEAD

Posterior circulation: Fairly codominant distal vertebral arteries
are patent and normal to the vertebrobasilar junction. Normal right
PICA origin. Left AICA appears dominant and patent. Patent basilar
artery without stenosis. Normal SCA and PCA origins. Posterior
communicating arteries are diminutive or absent. Normal bilateral
PCA branches.

Anterior circulation: Both ICA siphons are patent. No siphon plaque
or stenosis. Diminutive posterior communicating arteries are
identified with normal origins. Patent carotid termini. Normal MCA
and ACA origins. Diminutive or absent anterior communicating artery.
Bilateral ACA branches are within normal limits. Left MCA M1 segment
and trifurcation are patent without stenosis. Right MCA M1 segment
and bifurcation are patent without stenosis. Bilateral MCA branches
are within normal limits.

Venous sinuses: Patent.

Anatomic variants: Bovine arch configuration.

Review of the MIP images confirms the above findings
IMPRESSION: 1. Normal arterial findings on CTA head and neck.
2. Scaphocephaly.  But normal CT appearance of the brain.
3. Mild cervical spine degeneration.

## 2021-04-20 IMAGING — MR MR HEAD W/O CM
11 series · 44 of 48 positions shown · non-contrast
Comparison: None available.

CLINICAL DATA: Initial evaluation for acute dizziness.

EXAM:
MRI HEAD WITHOUT CONTRAST
TECHNIQUE: Multiplanar, multiecho pulse sequences of the brain and surrounding
structures were obtained without intravenous contrast.

[Series 9: T1 · sagittal · 5.0mm · 0.62mm/px · 2 of 25 slices shown (1 of 2)]
[im 1/25]
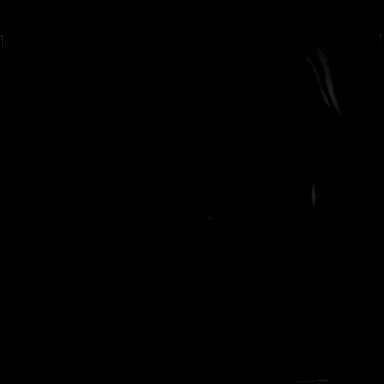
[im 25/25]
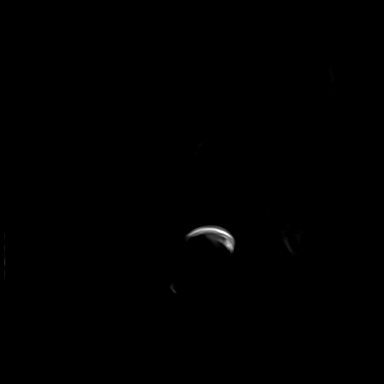

[Series 10: ax dwi_tracew · axial · 3.0mm · 0.65mm/px · z∈[-55,+100]mm · 4 of 44 slices shown]
[im 1/44]
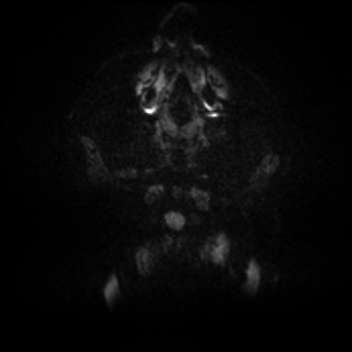
[im 15/44]
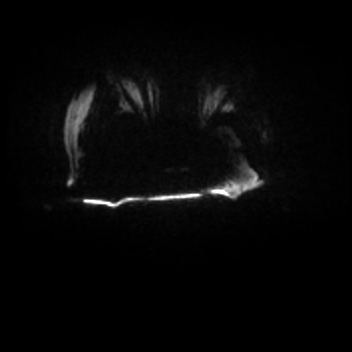
[im 29/44]
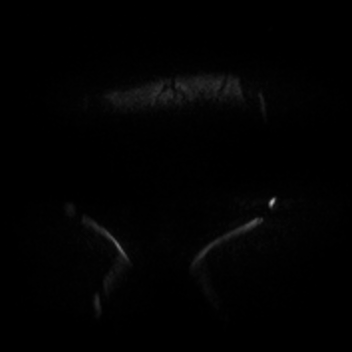
[im 44/44]
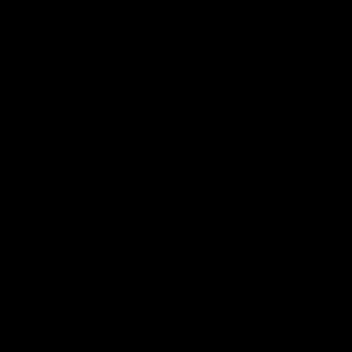

[Series 11: ax dwi_adc · axial · 3.0mm · 0.65mm/px · z∈[-55,+57]mm · 3 of 35 slices shown]
[im 1/35]
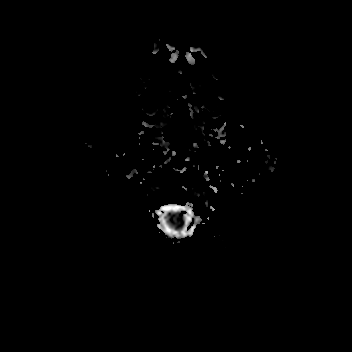
[im 18/35]
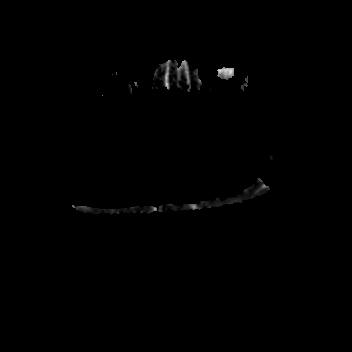
[im 35/35]
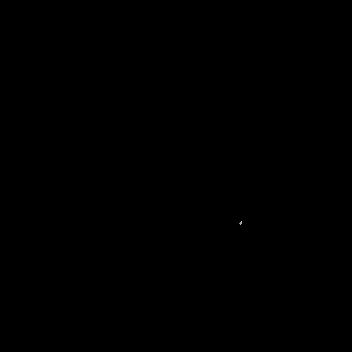

[Series 12: cor dwi_tracew · coronal · 5.0mm · 0.65mm/px · 3 of 40 slices shown]
[im 1/40]
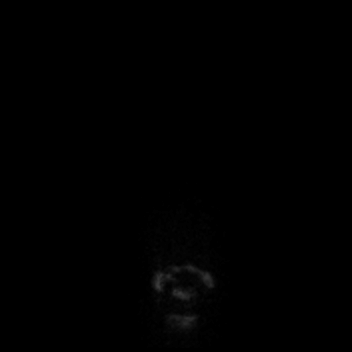
[im 20/40]
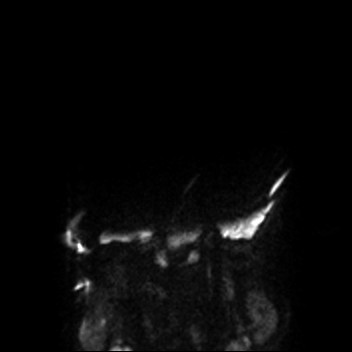
[im 40/40]
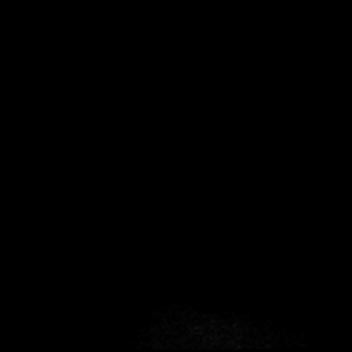

[Series 13: cor dwi_adc · coronal · 5.0mm · 0.65mm/px · 3 of 34 slices shown]
[im 1/34]
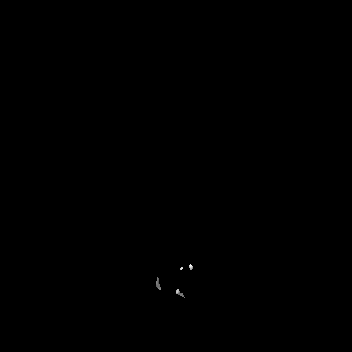
[im 17/34]
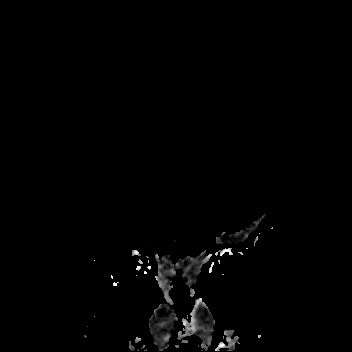
[im 34/34]
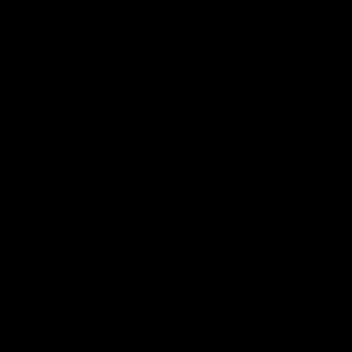

[Series 14: T2 · axial · 5.0mm · 0.53mm/px · z∈[-59,+103]mm · 2 of 28 slices shown (1 of 2)]
[im 1/28]
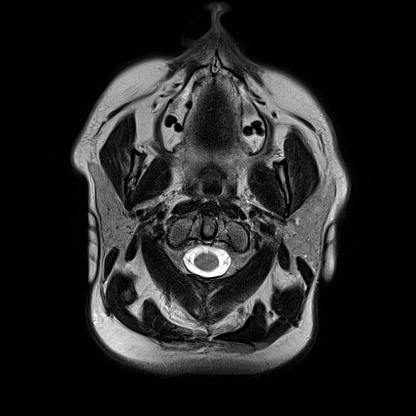
[im 28/28]
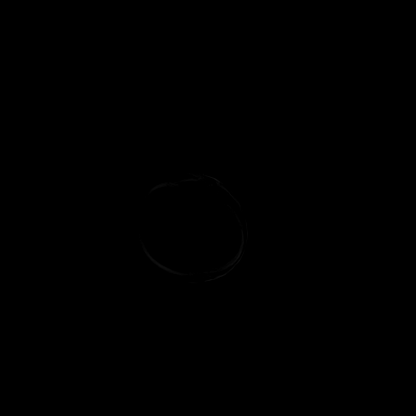

[Series 16: pha_images · axial · 3.0mm · 0.90mm/px · z∈[-67,+107]mm · 4 of 51 slices shown]
[im 1/51]
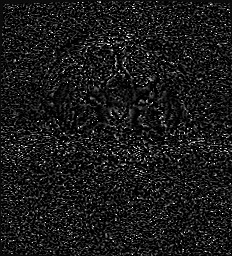
[im 17/51]
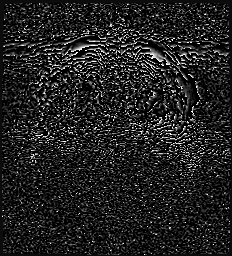
[im 34/51]
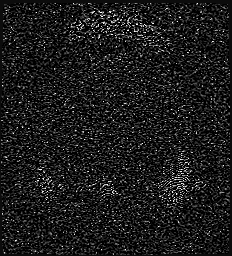
[im 51/51]
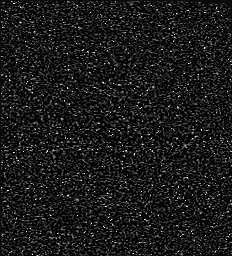

[Series 17: swi_images · axial · 3.0mm · 0.90mm/px · z∈[-67,+110]mm · 5 of 59 slices shown]
[im 1/59]
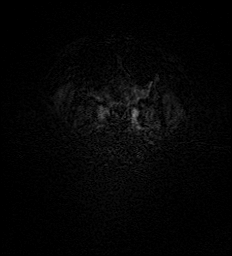
[im 15/59]
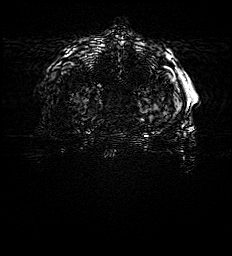
[im 30/59]
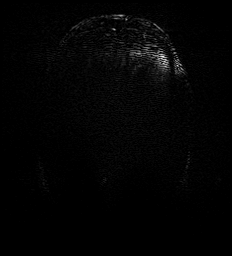
[im 44/59]
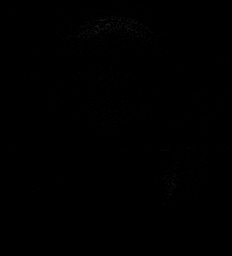
[im 59/59]
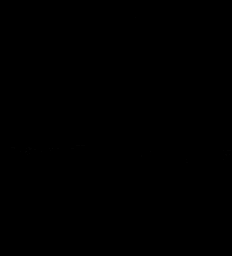

[Series 19: FLAIR · axial · 3.0mm · 0.53mm/px · z∈[-59,+103]mm · 5 of 55 slices shown]
[im 1/55]
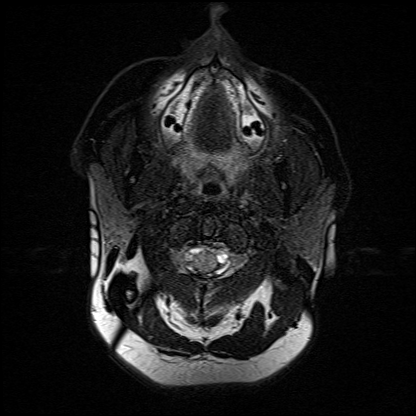
[im 14/55]
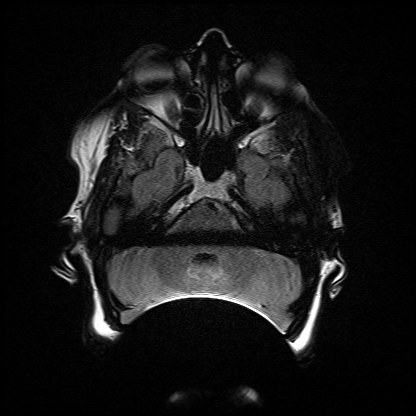
[im 28/55]
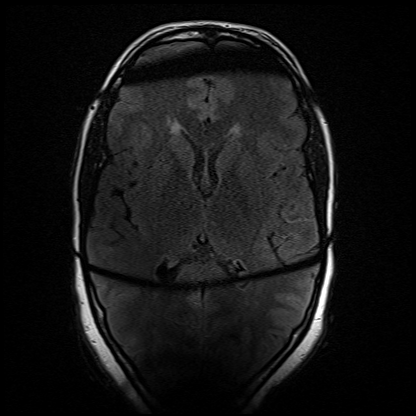
[im 41/55]
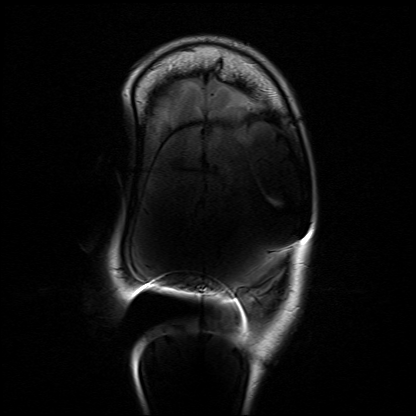
[im 55/55]
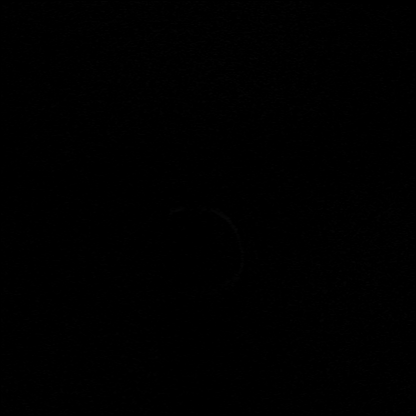

[Series 20: T1 · axial · 1.0mm · 0.98mm/px · z∈[-65,+108]mm · 10 of 174 slices shown (2 of 2)]
[im 1/174]
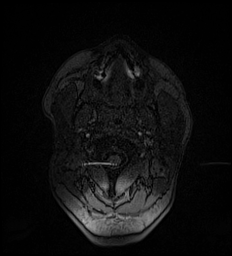
[im 14/174]
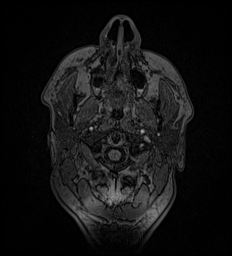
[im 27/174]
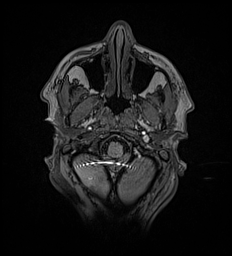
[im 40/174]
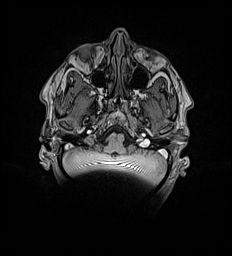
[im 54/174]
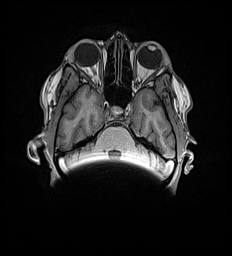
[im 80/174]
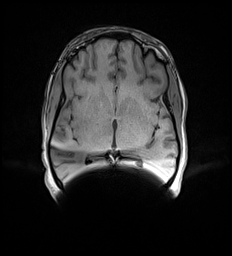
[im 94/174]
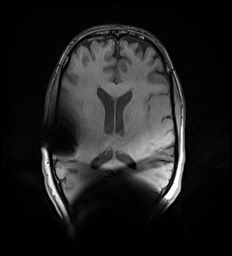
[im 120/174]
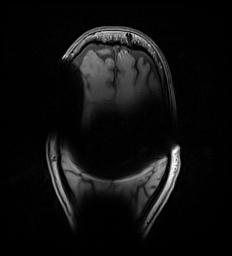
[im 147/174]
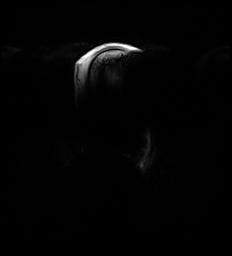
[im 174/174]
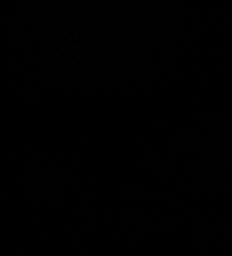

[Series 21: T2 · coronal · 5.0mm · 0.57mm/px · 3 of 32 slices shown (2 of 2)]
[im 1/32]
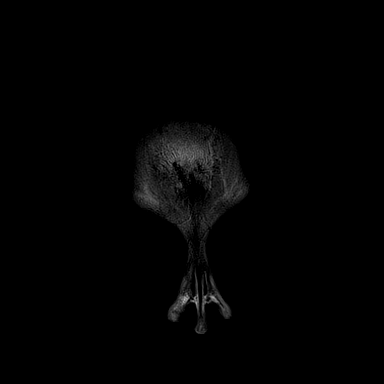
[im 16/32]
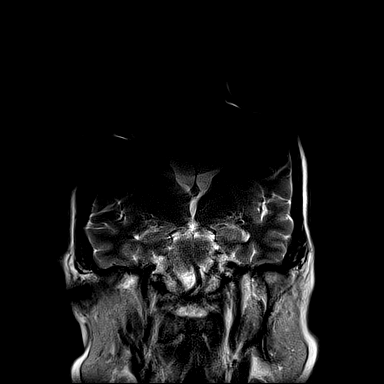
[im 32/32]
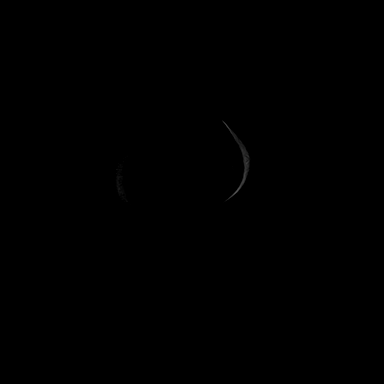

[44 of 48 positions shown; findings below may reference images not displayed]

FINDINGS: Brain: Examination severely limited by extensive susceptibility
artifact emanating from the scalp, markedly limiting assessment.

Cerebral volume within normal limits. No visible focal parenchymal
signal abnormality on this limited exam. No visible foci of
restricted diffusion to suggest acute or subacute ischemia, although
evaluation fairly limited. No visible areas of chronic infarction.
No mass lesion, mass effect or midline shift is visible. Ventricles
normal size without hydrocephalus. No visible extra-axial fluid
collection. Pituitary gland and suprasellar region grossly within
normal limits.

Vascular: Major intracranial vascular flow voids are maintained at
the skull base.

Skull and upper cervical spine: Craniocervical junction grossly
within normal limits. Visualized bone marrow signal intensity
normal. Scalp soft tissues limited assessment by extensive
susceptibility artifact.

Sinuses/Orbits: Visualized globes and orbital soft tissues within
normal limits. Paranasal sinuses appear largely clear. No visible
mastoid effusion.

Other: None.
IMPRESSION: 1. Technically limited exam due to extensive susceptibility artifact
emanating from the scalp, markedly limiting assessment.
2. Grossly negative brain MRI. No acute intracranial abnormality
identified.

## 2021-04-20 MED ORDER — MECLIZINE HCL 25 MG PO TABS
12.5000 mg | ORAL_TABLET | Freq: Once | ORAL | Status: AC
Start: 2021-04-20 — End: 2021-04-20
  Administered 2021-04-20: 12.5 mg via ORAL
  Filled 2021-04-20: qty 1

## 2021-04-20 MED ORDER — ASPIRIN EC 81 MG PO TBEC
81.0000 mg | DELAYED_RELEASE_TABLET | Freq: Every day | ORAL | 0 refills | Status: AC
Start: 2021-04-20 — End: 2021-05-20

## 2021-04-20 MED ORDER — MECLIZINE HCL 25 MG PO TABS
25.0000 mg | ORAL_TABLET | Freq: Once | ORAL | Status: AC
  Administered 2021-04-20: 25 mg via ORAL
  Filled 2021-04-20: qty 1

## 2021-04-20 MED ORDER — MECLIZINE HCL 25 MG PO TABS: 25.0000 mg | ORAL_TABLET | Freq: Three times a day (TID) | ORAL | 0 refills | Status: DC | PRN

## 2021-04-20 MED ORDER — IOHEXOL 350 MG/ML SOLN
75.0000 mL | Freq: Once | INTRAVENOUS | Status: AC | PRN
  Administered 2021-04-20: 75 mL via INTRAVENOUS

## 2021-04-20 NOTE — ED Provider Notes (Signed)
? ?[facility] ?Provider Note ? ? ? Event Date/Time  ? First MD Initiated Contact with Patient 04/20/21 0217   ?  (approximate) ? ? ?History  ? ?Dizziness ? ? ?HPI ? ?Donna Zuniga is a 46 y.o. female with no significant past medical history presents with dizziness.  Symptoms started yesterday around 2:30 AM.  Patient woke up and felt a spinning sensation and sensation that she was unable to control her eyes.  Is been rather constant since onset.  Worse when she lays back flat in bed.  Feels it with walking.  Denies associated visual change numbness tingling weakness difficulty swallowing or speaking.  Does have a mild headache.  Seen recently for palpitations and lightheadedness says that this dizzy feeling feels completely different than the lightheadedness she was having before.  Nausea but no vomiting.  Diagnosed with vertigo in the past as well but says this feels very different.  Denies tinnitus or hearing loss. ?  ? ?Past Medical History:  ?Diagnosis Date  ? Anxiety   ? Cervical dysplasia   ? Depression   ? Family history of ovarian cancer   ? doesn't meet [employer] Fed genetic testing guidelines.  ? Heart palpitations   ? [facility] P[facility]y [facility]onto at [facility]  ? Leiomyoma 2014  ? Pre-diabetes   ? Vitamin D deficiency 11/2015  ? ? ?Patient Active Problem List  ? Diagnosis Date Noted  ? Iron deficiency anemia due to chronic blood loss 10/20/2020  ? COVID-19 12/28/2019  ? Hypertensive disorder 03/31/2019  ? Exposure to severe acute respiratory syndrome coronavirus 2 (SARS-CoV-2) 03/31/2019  ? Family history of ovarian cancer 12/17/2017  ? Overweight (BMI 25.0-29.9) 06/04/2017  ? Enlarged thyroid 12/10/2016  ? Nocturia 12/10/2016  ? Pelvic pain 09/16/2016  ? Uterine leiomyoma 09/16/2016  ? Situational mixed anxiety and depressive disorder 07/12/2016  ? ? ? ?Physical Exam  ?Triage Vital Signs: ?ED Triage Vitals  ?Enc Vitals Group  ?   BP 04/20/21 0145 133/82  ?   Pulse Rate 04/20/21 0145 86  ?   Resp 04/20/21  0145 20  ?   Temp 04/20/21 0145 98.6 ?F (37 ?C)  ?   Temp Source 04/20/21 0145 Oral  ?   SpO2 04/20/21 0145 99 %  ?   Weight 04/20/21 0145 202 lb (91.6 kg)  ?   Height 04/20/21 0145 '5\' 8"'$  (1.727 m)  ?   Head Circumference --   ?   Peak Flow --   ?   Pain Score 04/20/21 0153 7  ?   Pain Loc --   ?   Pain Edu? --   ?   Excl. in [place]? --   ? ? ?Most recent vital signs: ?Vitals:  ? 04/20/21 0500 04/20/21 0600  ?BP: (!) 142/77 129/74  ?Pulse: 89 85  ?Resp: 16 16  ?Temp:    ?SpO2: 100% 99%  ? ? ? ?General: Awake, no distress.  ?CV:  Good peripheral perfusion.  ?Resp:  Normal effort.  ?Abd:  No distention.  ?Neuro:             Awake, Alert, Oriented x 3  ?Other:  Aox3, nml speech  ?PERRL, EOMI, face symmetric, nml tongue movement  ?+ vertical nystagmus on upward gaze, no horizontal nystagmus  ?5/5 strength in the BL upper and lower extremities  ?Sensation grossly intact in the BL upper and lower extremities  ?Finger-nose-finger intact BL ?Nml gait ? ? ? ?ED Results / Procedures / Treatments  ?Labs ?(  all labs ordered are listed, but only abnormal results are displayed) ?Labs Reviewed  ?BASIC METABOLIC PANEL - Abnormal; Notable for the following components:  ?    Result Value  ? Potassium 3.2 (*)   ? All other components within normal limits  ?URINALYSIS, ROUTINE W REFLEX MICROSCOPIC - Abnormal; Notable for the following components:  ? Color, Urine YELLOW (*)   ? APPearance HAZY (*)   ? Hgb urine dipstick MODERATE (*)   ? All other components within normal limits  ?CBC  ?POC URINE PREG, ED  ?POC URINE PREG, ED  ?TROPONIN I (HIGH SENSITIVITY)  ? ? ? ?EKG ? ?EKG interpretation performed by myself: NSR, nml axis, nml intervals, no acute ischemic changes ? ? ? ?RADIOLOGY ? ? ? ?PROCEDURES: ? ?Critical Care performed: No ? ?Procedures ? ?The patient is on the cardiac monitor to evaluate for evidence of arrhythmia and/or significant heart rate changes. ? ? ?MEDICATIONS ORDERED IN ED: ?Medications  ?meclizine (ANTIVERT) tablet 12.5  mg (has no administration in time range)  ?meclizine (ANTIVERT) tablet 25 mg (25 mg Oral Given 04/20/21 0253)  ?iohexol (OMNIPAQUE) 350 MG/ML injection 75 mL (75 mLs Intravenous Contrast Given 04/20/21 0514)  ? ? ? ?IMPRESSION / MDM / ASSESSMENT AND PLAN / ED COURSE  ?I reviewed the triage vital signs and the nursing notes. ?             ?               ? ?Differential diagnosis includes, but is not limited to, central vertigo secondary to CVA, demyelinating disease, BPPV, vestibular neuritis, labyrinthitis ? ?Patient is a 46 year old female presents with dizziness x2 days.  Woke her up from sleep and has been fairly persistent since then.  Describes a spinning sensation and feeling like she is unable to control her eyes movements.  Denies associated neurologic symptoms.  Does have mild headache.  On exam she notably has vertical nystagmus although the rest of her neurologic exam is reassuring she has normal gait without ataxia and has normal cerebellar findings otherwise.  With the finding of vertical nystagmus however I am concerned for central process and will obtain MRI brain with and without given potential for demyelinating disease given her age.  Labs are all reassuring other than mild hypokalemia to 3.2.  Will treat with meclizine for symptomatic treatment. ? ?MRI unfortunately is significantly limited due to artifact from patient therapies.  Radiology recommended not proceeding with contrast given low utility of the scan.  The imaging that was able to be reviewed appears to be normal.  Discussed with radiology and they think CTA would not likely be as limited so will obtain CTA to evaluate for any culprit lesion. ? ? ?CTA head and neck is normal.  Discussed findings with the patient.  When she is not moving she is symptom-free currently.  Overall with no findings on MRI despite being limited with normal CTA I think she is appropriate for discharge.  Will give neurology follow-up.  Discussed tricked return  precautions for any new neurologic findings.  We will start her on 81 mg of aspirin.  Meclizine prescribed for symptom control. ?  ? ? ?FINAL CLINICAL IMPRESSION(S) / ED DIAGNOSES  ? ?Final diagnoses:  ?Vertigo  ? ? ? ?Rx / DC Orders  ? ?ED Discharge Orders   ? ?      Ordered  ?  meclizine (ANTIVERT) 25 MG tablet  3 times daily PRN       ?  04/20/21 6045  ?  aspirin EC 81 MG tablet  Daily       ? 04/20/21 0629  ? ?  ?  ? ?  ? ? ? ?Note:  This document was prepared using Dragon voice recognition software and may include unintentional dictation errors. ?  ?Rada Hay, MD ?04/20/21 0630 ? ?

## 2021-04-20 NOTE — ED Notes (Signed)
Pt to MRI at this time.

## 2021-04-20 NOTE — Discharge Instructions (Signed)
You likely have vertigo.  The MRI of your brain was limited due to artifact from your hair piece but from what the radiologist could see there was no evidence of stroke.  The CT scan of the head and neck vessels was normal.  Please start taking 81 mg of aspirin daily.  You can take the meclizine as needed for dizziness.  Please follow-up with both neurology and your primary care provider.  They may eventually refer you to ENT. ?

## 2021-04-20 NOTE — ED Triage Notes (Signed)
Pt presents via POV with complaints of Dizziness for the last week. She notes being seen at [facility] and she had electrolyte replacement and was D/C. She notes having episodes of vertigo in the past but this dizzy spell has lasted for over 24 hours. Denies SOB.  ?

## 2021-04-26 ENCOUNTER — Ambulatory Visit: Payer: Federal, State, Local not specified - PPO | Admitting: Dermatology

## 2021-05-04 ENCOUNTER — Ambulatory Visit: Admit: 2021-05-04 | Payer: Federal, State, Local not specified - PPO | Admitting: Gastroenterology

## 2021-05-04 SURGERY — COLONOSCOPY WITH PROPOFOL
Anesthesia: Choice

## 2021-05-08 ENCOUNTER — Other Ambulatory Visit: Payer: Self-pay | Admitting: Physician Assistant

## 2021-05-08 DIAGNOSIS — R42 Dizziness and giddiness: Secondary | ICD-10-CM

## 2021-05-10 DIAGNOSIS — R7303 Prediabetes: Secondary | ICD-10-CM | POA: Insufficient documentation

## 2021-05-13 ENCOUNTER — Ambulatory Visit
Admission: RE | Admit: 2021-05-13 | Discharge: 2021-05-13 | Disposition: A | Discharge status: Home / Self Care | Payer: Federal, State, Local not specified - PPO | Source: Ambulatory Visit | Attending: Physician Assistant | Admitting: Physician Assistant

## 2021-05-13 DIAGNOSIS — R42 Dizziness and giddiness: Secondary | ICD-10-CM | POA: Diagnosis not present

## 2021-05-13 IMAGING — MR MR HEAD W/O CM
11 series · 48 of 48 positions shown · non-contrast
Comparison: [DATE]

CLINICAL DATA: Episodic dizziness

EXAM:
MRI HEAD WITHOUT CONTRAST
TECHNIQUE: Multiplanar, multiecho pulse sequences of the brain and surrounding
structures were obtained without intravenous contrast.

[Series 5: ax dwi_tracew · axial · 3.0mm · 0.65mm/px · z∈[-63,+92]mm · 5 of 48 slices shown]
[im 1/48]
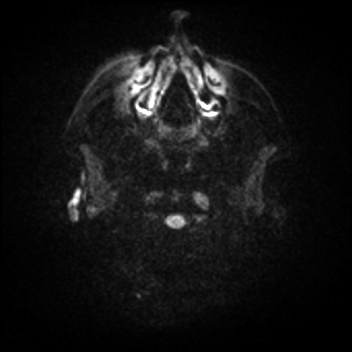
[im 12/48]
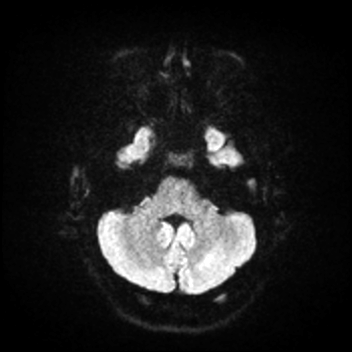
[im 24/48]
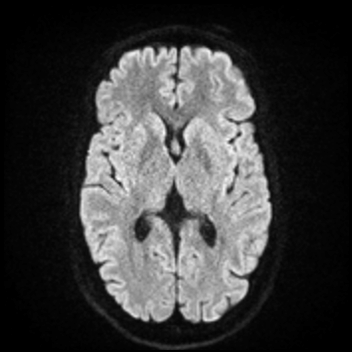
[im 36/48]
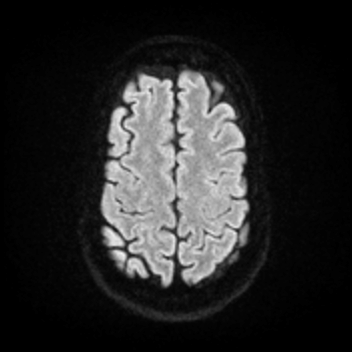
[im 48/48]
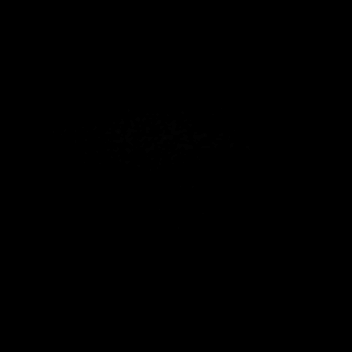

[Series 6: ax dwi_adc · axial · 3.0mm · 0.65mm/px · z∈[-63,+82]mm · 3 of 42 slices shown]
[im 1/42]
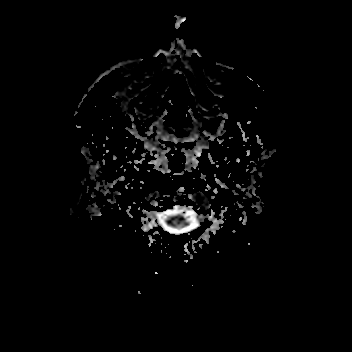
[im 21/42]
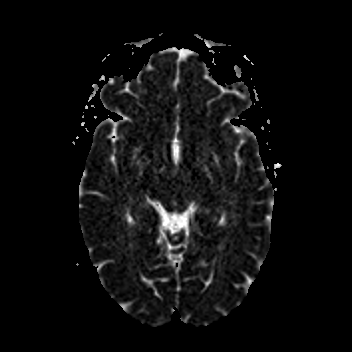
[im 42/42]
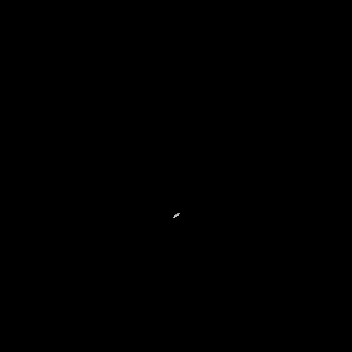

[Series 7: cor dwi_tracew · coronal · 5.0mm · 0.68mm/px · 3 of 40 slices shown]
[im 1/40]
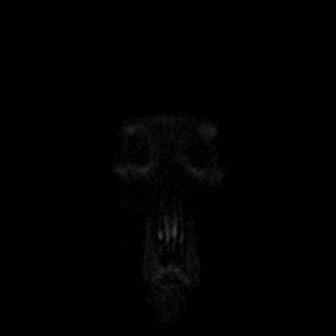
[im 20/40]
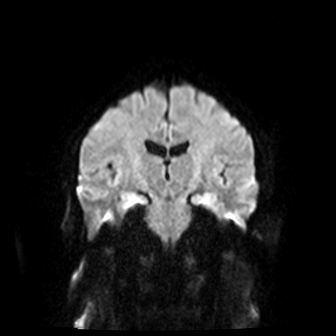
[im 40/40]
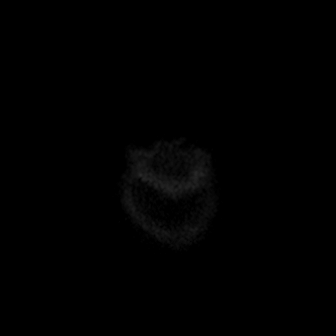

[Series 8: cor dwi_adc · coronal · 5.0mm · 0.68mm/px · 3 of 40 slices shown]
[im 1/40]
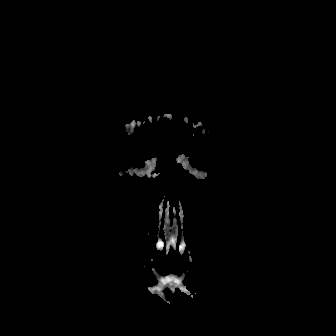
[im 20/40]
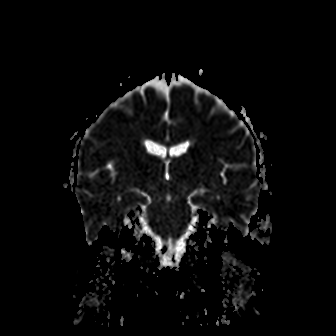
[im 40/40]
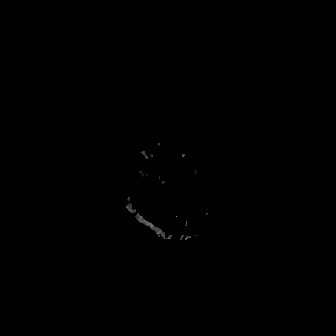

[Series 9: T1 · sagittal · 5.0mm · 0.62mm/px · 2 of 21 slices shown (1 of 2)]
[im 1/21]
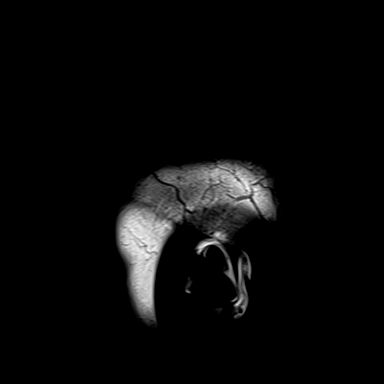
[im 21/21]
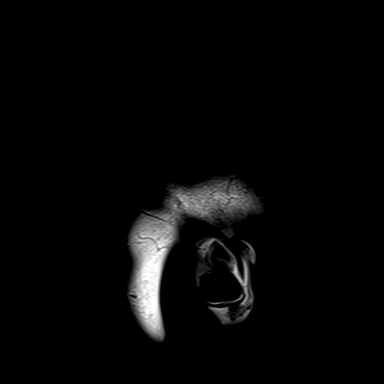

[Series 10: T2 · axial · 5.0mm · 0.53mm/px · z∈[-59,+85]mm · 2 of 25 slices shown (1 of 2)]
[im 1/25]
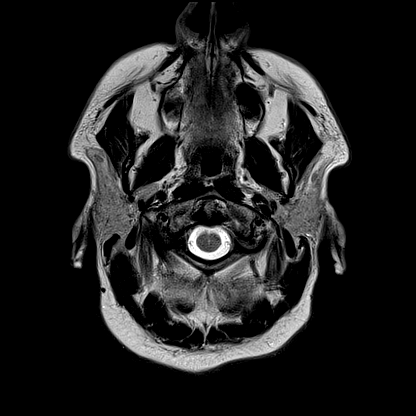
[im 25/25]
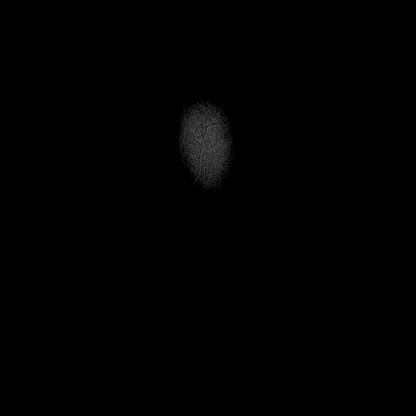

[Series 12: ax swi_pha · axial · 2.0mm · 0.90mm/px · z∈[-66,+92]mm · 6 of 79 slices shown]
[im 1/79]
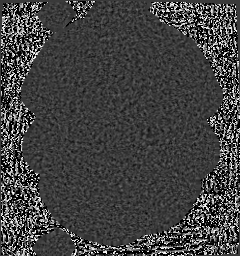
[im 16/79]
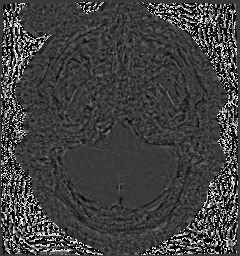
[im 32/79]
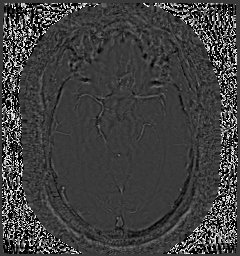
[im 47/79]
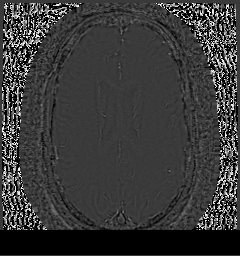
[im 63/79]
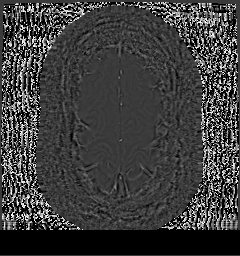
[im 79/79]
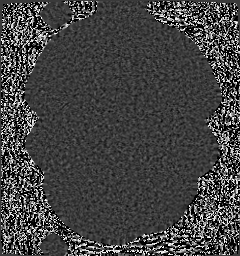

[Series 13: ax swi_swi · axial · 2.0mm · 0.90mm/px · z∈[-66,+92]mm · 6 of 80 slices shown]
[im 1/80]
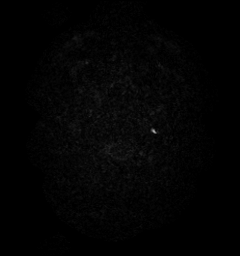
[im 16/80]
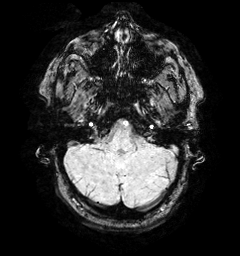
[im 32/80]
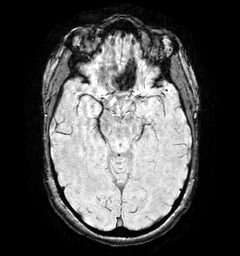
[im 48/80]
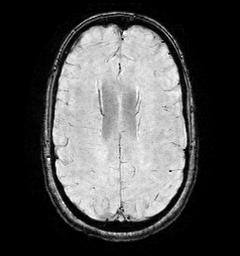
[im 64/80]
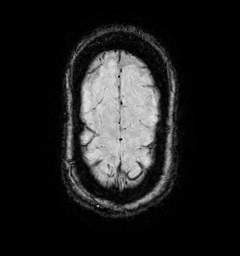
[im 80/80]
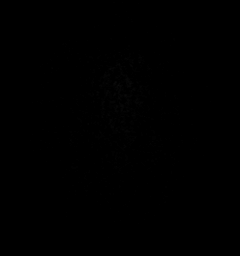

[Series 15: FLAIR · axial · 3.0mm · 0.53mm/px · z∈[-68,+94]mm · 4 of 55 slices shown]
[im 1/55]
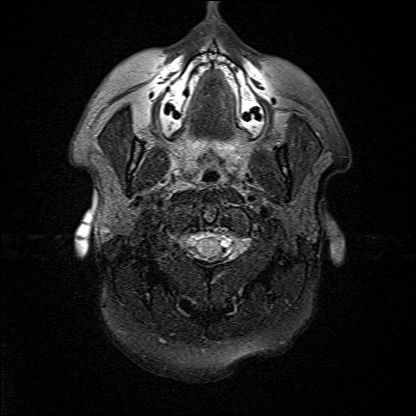
[im 19/55]
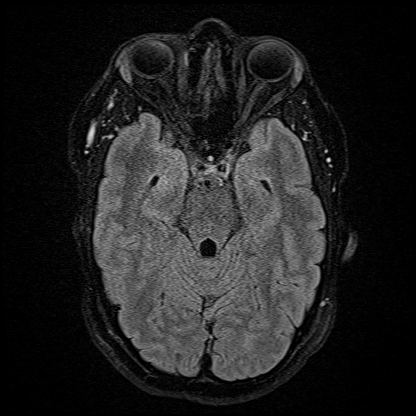
[im 37/55]
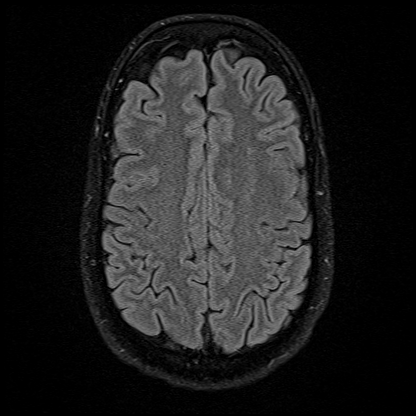
[im 55/55]
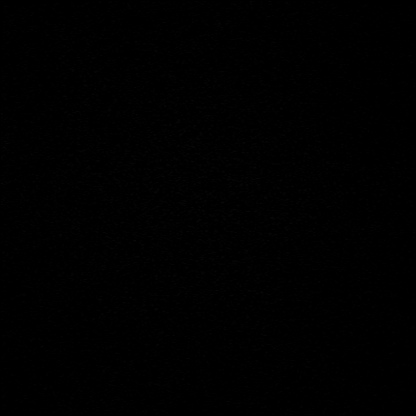

[Series 16: T1 · axial · 1.0mm · 0.98mm/px · z∈[-66,+92]mm · 12 of 160 slices shown (2 of 2)]
[im 1/160]
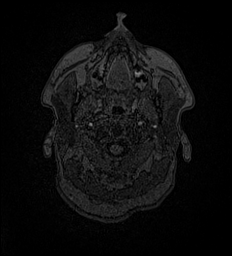
[im 15/160]
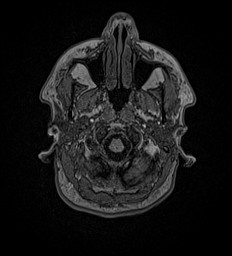
[im 29/160]
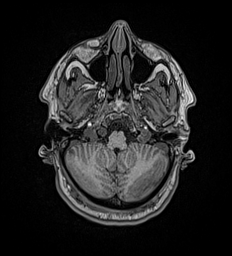
[im 44/160]
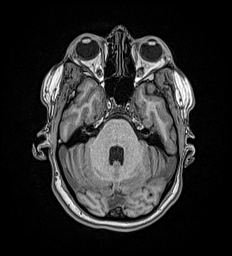
[im 58/160]
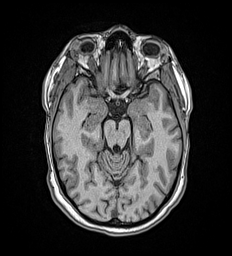
[im 73/160]
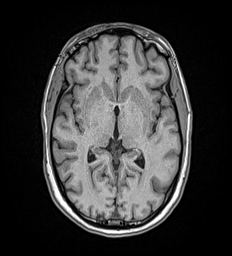
[im 87/160]
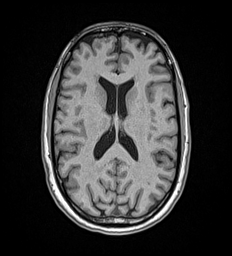
[im 102/160]
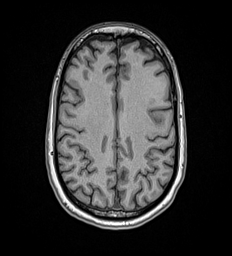
[im 116/160]
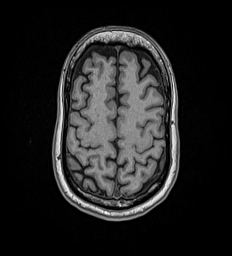
[im 131/160]
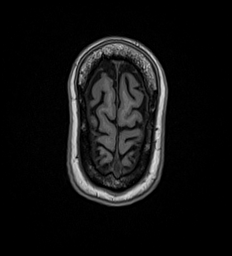
[im 145/160]
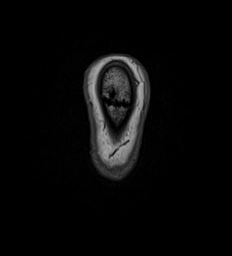
[im 160/160]
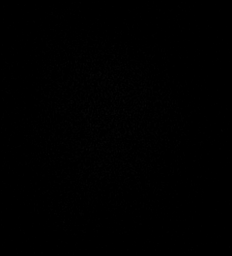

[Series 17: T2 · coronal · 5.0mm · 0.57mm/px · 2 of 32 slices shown (2 of 2)]
[im 1/32]
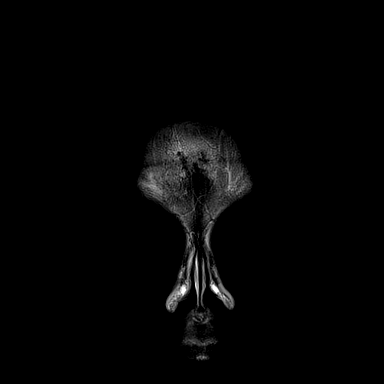
[im 32/32]
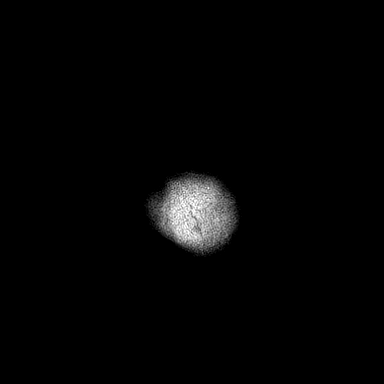

[48 of 48 positions shown; findings below may reference images not displayed]

FINDINGS: Brain: There is no acute infarction or intracranial hemorrhage.
There is no intracranial mass, mass effect, or edema. There is no
hydrocephalus or extra-axial fluid collection. Ventricles and sulci
are normal in size and configuration. Minimal punctate foci of T2
hyperintensity in the supratentorial white matter likely reflect
nonspecific gliosis/demyelination of doubtful significance.

Vascular: Major vessel flow voids at the skull base are preserved.

Skull and upper cervical spine: Dolichocephalic appearance of the
calvarium. Normal marrow signal is preserved.

Sinuses/Orbits: Paranasal sinuses are aerated. Orbits are
unremarkable.

Other: Sella is unremarkable.  Mastoid air cells are clear.
IMPRESSION: No acute or significant abnormality.

## 2021-05-15 ENCOUNTER — Ambulatory Visit: Payer: Federal, State, Local not specified - PPO

## 2021-05-22 NOTE — Therapy (Signed)
?OUTPATIENT PHYSICAL THERAPY VESTIBULAR EVALUATION ? ? ?Patient Name: Donna Zuniga ?MRN: [id number] ?DOB:1975/04/26, 46 y.o., female ?Today's Date: 05/23/2021 ? ?PCP: [name], MD ?REFERRING PROVIDER: Wonda Cerise, PA-C ? ? PT End of Session - 05/23/21 0841   ? ? Visit Number 1   ? Number of Visits 9   ? Date for PT Re-Evaluation 07/18/21   ? Authorization Type eval: 05/23/21   ? PT Start Time 0845   ? PT Stop Time 0930   ? PT Time Calculation (min) 45 min   ? Activity Tolerance Patient tolerated treatment well   ? Behavior During Therapy [facility] for tasks assessed/performed   ? ?  ?  ? ?  ? ? ?Past Medical History:  ?Diagnosis Date  ? Anxiety   ? Cervical dysplasia   ? Depression   ? Family history of ovarian cancer   ? doesn't meet BCBS Fed genetic testing guidelines.  ? Heart palpitations   ? Dr. Posey Pronto at [facility]  ? Leiomyoma 2014  ? Pre-diabetes   ? Vitamin D deficiency 11/2015  ? ?Past Surgical History:  ?Procedure Laterality Date  ? COLPOSCOPY    ? LEEP    ? ?Patient Active Problem List  ? Diagnosis Date Noted  ? Iron deficiency anemia due to chronic blood loss 10/20/2020  ? COVID-19 12/28/2019  ? Hypertensive disorder 03/31/2019  ? Exposure to severe acute respiratory syndrome coronavirus 2 (SARS-CoV-2) 03/31/2019  ? Family history of ovarian cancer 12/17/2017  ? Overweight (BMI 25.0-29.9) 06/04/2017  ? Enlarged thyroid 12/10/2016  ? Nocturia 12/10/2016  ? Pelvic pain 09/16/2016  ? Uterine leiomyoma 09/16/2016  ? Situational mixed anxiety and depressive disorder 07/12/2016  ? ? ? ?PCP: [name], MD ? ?REFERRING PROVIDER: Wonda Cerise, PA-C ? ?REFERRING DIAGNOSIS: Vertigo ? ?THERAPY DIAG: Dizziness and giddiness ? ?ONSET DATE: 02/23/21 (approximate) ? ?FOLLOW UP APPT WITH PROVIDER: Yes  ? ? ?SUBJECTIVE:  ? ?Chief Complaint:  ?Dizziness ? ?Pertinent History ?Pt reports 2-3 months of dizziness. She had one similar episode years ago but symptoms resolved. She states that she gets symptoms  if she rolls over, lays flat, bends forward, or turns her head quickly (such as when driving). She states that after the vertigo resolves she feels slightly foggy and it takes multiple minutes for her to re-engage in whatever activity she was performing previously. She also states that her body feels heavy and she doesn't have much energy. She has been the ED for these symptoms and has also seen ENT. Per note from ENT that pt brought with her they treated her with the Epley maneuver for R posterior canal BPPV. She has had a brain MRI which was normal. She has an appointment to see neurology tomorrow. Pt is currently out of work on FMLA due to her symptoms and is eager to return to work. 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 She notices that if she is walking in the hallway next to someone she feels dizzy especially if she has to turn her head to talk. Symptoms have worsened her depression due to the significant life changes they have caused. She has been unable to workout because if she gets on the treadmill it makes her feel very dizzy. Pt has tried taking meclizine but it makes her very drowsy so she is unable to take. She also gets nausea and has an anti-emetic but hasn't had to use. ? ?  Description of dizziness: vertigo ?Frequency: Daily depending on whether she triggers symptoms with movement ?Duration: "a couple minutes" ?Symptom nature: intermittent ?Progression of symptoms since onset: worse ?History of similar episodes: Yes ? ?Provocative Factors: Looking down, bending over, turning head ?Easing Factors: "I close my eyes and breath through it" ? ?Auditory complaints (tinnitus, pain, drainage, hearing loss, aural fullness): Yes, right sided tinnitus, intermittent, which is new. No hearing difficulties (hearing test normal at audiologist); ?Vision changes (diplopia, visual field loss, recent changes, recent eye exam): Yes, vision  has been blurry but otherwise no changes ?Chest pain/palpitations: Yes, pt reports some chest pain and lightheadedness that she attributes to anxiety, pt reports her heart rate was high when she went to the ED for chest pain. She was advised to take magnesium daily as well as take ASA '81mg'$  ?History of head injury/concussion: No ?Stress/anxiety: Yes, pt reports significant anxiety and depression related to these symptoms; ?Headache/migraine: History of headaches, worsened by these symptoms.  ? ?Has patient fallen in last 6 months? No, ?Pertinent pain: Yes, history of new neck pain ?Dominant hand: right ?Imaging: Yes, brain MRI 05/13/21: No acute or significant abnormality. ?Prior level of function: Independent ?Occupational demands: Works at [employer] ?[place]: Pt likes to exercise ? ?Red Flags: (dysarthria, dysphagia, drop attacks, bowel and bladder changes, recent weight loss/gain) Review of systems negative for red flags.  ? ?PRECAUTIONS: None ? ?WEIGHT BEARING RESTRICTIONS No ? ?PATIENT GOALS Pt wants to resolve her symptoms in order to return to work symptom-free  ? ? ?OBJECTIVE ? ?EXAMINATION ? ?POSTURE: No gross deficits contributing to symptoms ? ?NEUROLOGICAL SCREEN: (2+ unless otherwise noted.) N=normal  Ab=abnormal ? ?Level Dermatome R L Myotome R L Reflex R L  ?C3 Anterior Neck   Sidebend C2-3 N N Jaw CN V    ?C4 Top of Shoulder   Shoulder Shrug C4 N N Hoffman?s UMN    ?C5 Lateral Upper Arm   Shoulder ABD C4-5 N N Biceps C5-6    ?C6 Lateral Arm/ Thumb   Arm Flex/ Wrist Ext C5-6 N N Brachiorad. C5-6    ?C7 Middle Finger   Arm Ext//Wrist Flex C6-7 N N Triceps C7    ?C8 4th & 5th Finger   Flex/ Ext Carpi Ulnaris C8 N N Patellar (L3-4)    ?T1 Medial Arm   Interossei T1 N N Gastrocnemius    ?L2 Medial thigh/groin   Illiopsoas (L2-3) N N     ?L3 Lower thigh/med.knee   Quadriceps (L3-4) N N     ?L4 Medial leg/lat thigh   Tibialis Ant (L4-5) N N     ?L5 Lat. leg & dorsal foot   EHL (L5) N N     ?S1  post/lat foot/thigh/leg   Gastrocnemius (S1-2) N N     ?S2 Post./med. thigh & leg   Hamstrings (L4-S3) N N     ? ? ?CRANIAL NERVES ?II, III, IV, VI: Pupils equal and reactive to light, visual acuity and visual fields are intact, extraocular muscles are intact  ?V: Facial sensation is intact and symmetric bilaterally  ?VII: Facial strength is intact and symmetric bilaterally  ?VIII: Hearing is normal as tested by gross conversation ?IX, X: Palate elevates midline, normal phonation, uvula midline ?XI: Shoulder shrug strength is intact  ?XII: Tongue protrudes midline  ? ? ?SOMATOSENSORY ?Deferred sensation testing ? ? ?COORDINATION ?Deferred ?  ? ?RANGE OF MOTION ?Cervical Spine AROM WFL and painless in all planes. No focal deficits in AROM  noted in BUE/BLE ? ? ?MANUAL MUSCLE TESTING ?BUE/BLE strength WNL without focal deficits ? ? ?TRANSFERS/GAIT ?Independent for transfers and ambulation without assistive device  ? ? ?PATIENT SURVEYS ?FOTO: 39, predicted improvement to 56 ?DHI: To be completed at next visit ? ? ?POSTURAL CONTROL TESTS ? ?Clinical Test of Sensory Interaction for Balance (CTSIB): Deferred ? ? ?OCULOMOTOR / VESTIBULAR TESTING ? ?Oculomotor Exam- Room Light: Deferred ? ? ?Oculomotor Exam- Fixation Suppressed: DeferredBPPV TESTS: ? Symptoms Duration Intensity Nystagmus  ?L Dix-Hallpike Mild  Brief Mild None  ?R Dix-Hallpike Dizziness 10s Moderate Upbeating R torsional nystagmus  ?L Head Roll      ?R Head Roll      ?L Sidelying Test      ?R Sidelying Test      ?(blank = not tested) ? ? ?FUNCTIONAL OUTCOME MEASURES ? ? Results Comments  ?BERG    ?DGI    ?FGA    ?TUG    ?5TSTS    ?6 Minute Walk Test    ?10 Meter Gait Speed    ?ABC Scale    ?DHI    ?(blank = not tested) ? ? ?TODAY'S TREATMENT  ? ? ?Canalith Repositioning Treatment ?Pt treated with 2 bouts of Epley Maneuver for presumed R posterior canal BPPV. One minute holds in each position and retesting between maneuvers. After first maneuver pt reports  only faint dizziness and no nystagmus observed which is a significant improvement. After second maneuver pt provided handout with education about home modified Epley maneuver. ? ? ?[place]

## 2021-05-23 ENCOUNTER — Ambulatory Visit: Payer: Federal, State, Local not specified - PPO | Attending: Physician Assistant

## 2021-05-23 DIAGNOSIS — R42 Dizziness and giddiness: Secondary | ICD-10-CM | POA: Insufficient documentation

## 2021-05-24 NOTE — Therapy (Signed)
OUTPATIENT PHYSICAL THERAPY VESTIBULAR TREATMENT   Patient Name: Donna Zuniga MRN: [id number] DOB:[birth year], 46 y.o., female Today's Date: 05/25/2021  PCP: [name], MD REFERRING PROVIDER: Wonda Cerise, PA-C   PT End of Session - 05/25/21 1023     Visit Number 2    Number of Visits 9    Date for PT Re-Evaluation 07/18/21    Authorization Type eval: 05/23/21    PT Start Time 1021    PT Stop Time 1100    PT Time Calculation (min) 39 min    Activity Tolerance Patient tolerated treatment well    Behavior During Therapy [facility] for tasks assessed/performed              Past Medical History:  Diagnosis Date   Anxiety    Cervical dysplasia    Depression    Family history of ovarian cancer    doesn't meet BCBS Fed genetic testing guidelines.   Heart palpitations    Dr. Posey Pronto at [facility]   Leiomyoma 2014   Pre-diabetes    Vitamin D deficiency 11/2015   Past Surgical History:  Procedure Laterality Date   COLPOSCOPY     LEEP     Patient Active Problem List   Diagnosis Date Noted   Iron deficiency anemia due to chronic blood loss 10/20/2020   COVID-19 12/28/2019   Hypertensive disorder 03/31/2019   Exposure to severe acute respiratory syndrome coronavirus 2 (SARS-CoV-2) 03/31/2019   Family history of ovarian cancer 12/17/2017   Overweight (BMI 25.0-29.9) 06/04/2017   Enlarged thyroid 12/10/2016   Nocturia 12/10/2016   Pelvic pain 09/16/2016   Uterine leiomyoma 09/16/2016   Situational mixed anxiety and depressive disorder 07/12/2016     PCP: [name], MD  REFERRING PROVIDER: Wonda Cerise, PA-C  REFERRING DIAGNOSIS: Vertigo  THERAPY DIAG: Dizziness and giddiness  ONSET DATE: 02/23/21 (approximate)  FOLLOW UP APPT WITH PROVIDER: Yes    FROM INITIAL EVALUATION:  SUBJECTIVE:   Chief Complaint:  Dizziness  Pertinent History Pt reports 2-3 months of dizziness. She had one similar episode years ago but symptoms resolved. She  states that she gets symptoms if she rolls over, lays flat, bends forward, or turns her head quickly (such as when driving). She states that after the vertigo resolves she feels slightly foggy and it takes multiple minutes for her to re-engage in whatever activity she was performing previously. She also states that her body feels heavy and she doesn't have much energy. She has been the ED for these symptoms and has also seen ENT. Per note from ENT that pt brought with her they treated her with the Epley maneuver for R posterior canal BPPV. She has had a brain MRI which was normal. She has an appointment to see neurology tomorrow. Pt is currently out of work on FMLA due to her symptoms and is eager to return to work. 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 She notices that if she is walking in the hallway next to someone she feels dizzy especially if she has to turn her head to talk. Symptoms have worsened her depression due to the significant life changes they have caused. She has been unable to workout because if she gets on the treadmill it makes her feel very dizzy. Pt has tried taking meclizine but it makes her very drowsy so she is unable to take. She also gets nausea and has an anti-emetic  but hasn't had to use.  Description of dizziness: vertigo Frequency: Daily depending on whether she triggers symptoms with movement Duration: "a couple minutes" Symptom nature: intermittent Progression of symptoms since onset: worse History of similar episodes: Yes  Provocative Factors: Looking down, bending over, turning head Easing Factors: "I close my eyes and breath through it"  Auditory complaints (tinnitus, pain, drainage, hearing loss, aural fullness): Yes, right sided tinnitus, intermittent, which is new. No hearing difficulties (hearing test normal at audiologist); Vision changes (diplopia, visual field loss, recent changes,  recent eye exam): Yes, vision has been blurry but otherwise no changes Chest pain/palpitations: Yes, pt reports some chest pain and lightheadedness that she attributes to anxiety, pt reports her heart rate was high when she went to the ED for chest pain. She was advised to take magnesium daily as well as take ASA '81mg'$  History of head injury/concussion: No Stress/anxiety: Yes, pt reports significant anxiety and depression related to these symptoms; Headache/migraine: History of headaches, worsened by these symptoms.   Has patient fallen in last 6 months? No, Pertinent pain: Yes, history of new neck pain Dominant hand: right Imaging: Yes, brain MRI 05/13/21: No acute or significant abnormality. Prior level of function: Independent Occupational demands: Works at [employer]: Pt likes to exercise  [employer]: (dysarthria, dysphagia, drop attacks, bowel and bladder changes, recent weight loss/gain) Review of systems negative for red flags.   PRECAUTIONS: None  WEIGHT BEARING RESTRICTIONS No  PATIENT GOALS Pt wants to resolve her symptoms in order to return to work symptom-free    OBJECTIVE  EXAMINATION  POSTURE: No gross deficits contributing to symptoms  NEUROLOGICAL SCREEN: (2+ unless otherwise noted.) N=normal  Ab=abnormal  Level Dermatome R L Myotome R L Reflex R L  C3 Anterior Neck   Sidebend C2-3 N N Jaw CN V    C4 Top of Shoulder   Shoulder Shrug C4 N N Hoffman's UMN    C5 Lateral Upper Arm   Shoulder ABD C4-5 N N Biceps C5-6    C6 Lateral Arm/ Thumb   Arm Flex/ Wrist Ext C5-6 N N Brachiorad. C5-6    C7 Middle Finger   Arm Ext//Wrist Flex C6-7 N N Triceps C7    C8 4th & 5th Finger   Flex/ Ext Carpi Ulnaris C8 N N Patellar (L3-4)    T1 Medial Arm   Interossei T1 N N Gastrocnemius    L2 Medial thigh/groin   Illiopsoas (L2-3) N N     L3 Lower thigh/med.knee   Quadriceps (L3-4) N N     L4 Medial leg/lat thigh   Tibialis Ant (L4-5) N N     L5 Lat. leg &  dorsal foot   EHL (L5) N N     S1 post/lat foot/thigh/leg   Gastrocnemius (S1-2) N N     S2 Post./med. thigh & leg   Hamstrings (L4-S3) N N       CRANIAL NERVES II, III, IV, VI: Pupils equal and reactive to light, visual acuity and visual fields are intact, extraocular muscles are intact  V: Facial sensation is intact and symmetric bilaterally  VII: Facial strength is intact and symmetric bilaterally  VIII: Hearing is normal as tested by gross conversation IX, X: Palate elevates midline, normal phonation, uvula midline XI: Shoulder shrug strength is intact  XII: Tongue protrudes midline    SOMATOSENSORY Deferred sensation testing   COORDINATION Deferred    RANGE OF MOTION Cervical Spine AROM WFL and painless in all  planes. No focal deficits in AROM noted in BUE/BLE   MANUAL MUSCLE TESTING BUE/BLE strength WNL without focal deficits   TRANSFERS/GAIT Independent for transfers and ambulation without assistive device    PATIENT SURVEYS FOTO: 39, predicted improvement to 56 DHI: To be completed at next visit   POSTURAL CONTROL TESTS  Clinical Test of Sensory Interaction for Balance (CTSIB): Deferred   OCULOMOTOR / VESTIBULAR TESTING  Oculomotor Exam- Room Light: Deferred   Oculomotor Exam- Fixation Suppressed: DeferredBPPV TESTS:  Symptoms Duration Intensity Nystagmus  L Dix-Hallpike Mild  Brief Mild None  R Dix-Hallpike Dizziness 10s Moderate Upbeating R torsional nystagmus  L Head Roll      R Head Roll      L Sidelying Test      R Sidelying Test      (blank = not tested)   FUNCTIONAL OUTCOME MEASURES   Results Comments  BERG    DGI    FGA    TUG    5TSTS    6 Minute Walk Test    10 Meter Gait Speed    ABC Scale    DHI    (blank = not tested)   TODAY'S TREATMENT   SUBJECTIVE:  PAIN:  Canalith Repositioning Treatment Right Dix-Hallpike is positive for vigorous upbeating R torsional nystagmus with 10/10 vertigo. Symptoms and nystagmus last  for about 10s after a 2 second latency. 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 One minute holds in each position and retesting between maneuvers. All retesting is negative for both vertigo and nystagmus. During second maneuver pt does report dizziness in positions 2, 3, and at end when returning to sitting.    PATIENT EDUCATION:  Education details: BPPV, plan of care, home CRT Person educated: Patient Education method: Explanation, Demonstration Education comprehension: verbalized understanding and returned demonstration    HOME EXERCISE TREATMENT Access Code: [email or web address] URL: [email or web address] Date: 05/23/2021 Prepared by: Roxana Hires  Exercises - Self-Epley Maneuver Right Ear  - 1 x daily - 7 x weekly - 3 reps - 60s in each position hold   ASSESSMENT:  CLINICAL IMPRESSION: Right Dix-Hallpike is positive for vigorous upbeating R torsional nystagmus with 10/10 vertigo. Symptoms and nystagmus last for about 10s after a 2 second latency. 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 One minute holds in each position and retesting between maneuvers. All retesting is negative for both vertigo and nystagmus. During second maneuver pt does report dizziness in positions 2, 3, and at end when returning to sitting. Pt encouraged to perform Epley Maneuver three times once/day and follow-up as scheduled. Will continue to test and treat BPPV as indicated and once fully resolved will perform additional vestibular/oculomotor testing as necessary. Pt will benefit from PT services to address deficits in dizziness in order to return to full function at home, work, and with leisure activities.  REHAB POTENTIAL: Good  CLINICAL DECISION MAKING: Unstable/unpredictable  EVALUATION COMPLEXITY: Low   GOALS: Goals reviewed with patient? No  SHORT TERM GOALS: Target date: 06/22/2021  Pt will be independent with HEP  for dizziness in order to decrease symptoms, improve balance,decrease fall risk, and improve function at home. Baseline: Goal status: INITIAL   LONG TERM GOALS: Target date: 07/20/2021  Pt will increase FOTO to at least 56 to demonstrate significant improvement in function at home related to dizziness.  Baseline: 05/23/21: 39 Goal status: INITIAL  2.  Pt will decrease DHI score  by at least 18 points in order to demonstrate clinically significant reduction in disability related to dizziness.  Baseline: To be completed at first follow-up visit Goal status: INITIAL  3.  Pt will report 100% resolution of her vertigo in order to demonstrate clinically significant improvement in symptoms and facilitate return to work and return to exercise.      Baseline:  Goal status: INITIAL   PLAN:  PT FREQUENCY: 1-2x/week  PT DURATION: 8 weeks  PLANNED INTERVENTIONS: Therapeutic exercises, Therapeutic activity, Neuromuscular re-education, Balance training, Gait training, Patient/Family education, Joint manipulation, Joint mobilization, Canalith repositioning, Aquatic Therapy, Dry Needling, Cognitive remediation, Electrical stimulation, Spinal manipulation, Spinal mobilization, Cryotherapy, Moist heat, Traction, Ultrasound, Ionotophoresis '4mg'$ /ml Dexamethasone, and Manual therapy  PLAN FOR NEXT SESSION: Pt to complete DHI, BPPV testing and treatment as needed, review home modified Epley maneuver with patient, additional oculomotor and vestibular testing as needed/appropriate   Lyndel Safe Caleel Kiner PT, DPT, GCS  Cleotis Sparr 05/25/2021, 11:05 AM

## 2021-05-25 ENCOUNTER — Ambulatory Visit: Payer: Federal, State, Local not specified - PPO

## 2021-05-25 DIAGNOSIS — R42 Dizziness and giddiness: Secondary | ICD-10-CM | POA: Diagnosis not present

## 2021-05-31 ENCOUNTER — Ambulatory Visit: Payer: Federal, State, Local not specified - PPO

## 2021-05-31 DIAGNOSIS — R42 Dizziness and giddiness: Secondary | ICD-10-CM

## 2021-05-31 NOTE — Therapy (Signed)
OUTPATIENT PHYSICAL THERAPY VESTIBULAR TREATMENT   Patient Name: Fontaine Kossman MRN: [id number] DOB:[birth year], 46 y.o., female Today's Date: 05/31/2021  PCP: [name], MD REFERRING PROVIDER: [name], *   PT End of Session - 05/31/21 1618     Visit Number 3    Number of Visits 9    Date for PT Re-Evaluation 07/18/21    Authorization Type eval: 05/23/21    PT Start Time 1620    PT Stop Time 1640    PT Time Calculation (min) 20 min    Activity Tolerance Patient tolerated treatment well    Behavior During Therapy [facility] for tasks assessed/performed               Past Medical History:  Diagnosis Date   Anxiety    Cervical dysplasia    Depression    Family history of ovarian cancer    doesn't meet [employer] Fed genetic testing guidelines.   Heart palpitations    Dr. Posey Pronto at [facility]   Leiomyoma 2014   Pre-diabetes    Vitamin D deficiency 11/2015   Past Surgical History:  Procedure Laterality Date   COLPOSCOPY     LEEP     Patient Active Problem List   Diagnosis Date Noted   Iron deficiency anemia due to chronic blood loss 10/20/2020   COVID-19 12/28/2019   Hypertensive disorder 03/31/2019   Exposure to severe acute respiratory syndrome coronavirus 2 (SARS-CoV-2) 03/31/2019   Family history of ovarian cancer 12/17/2017   Overweight (BMI 25.0-29.9) 06/04/2017   Enlarged thyroid 12/10/2016   Nocturia 12/10/2016   Pelvic pain 09/16/2016   Uterine leiomyoma 09/16/2016   Situational mixed anxiety and depressive disorder 07/12/2016     PCP: [name], MD  REFERRING PROVIDER: [name], *  REFERRING DIAGNOSIS: Vertigo  THERAPY DIAG: Dizziness and giddiness  ONSET DATE: 02/23/21 (approximate)  FOLLOW UP APPT WITH PROVIDER: Yes    FROM INITIAL EVALUATION:  SUBJECTIVE:   Chief Complaint:  Dizziness  Pertinent History Pt reports 2-3 months of dizziness. She had one similar episode years ago but symptoms  resolved. She states that she gets symptoms if she rolls over, lays flat, bends forward, or turns her head quickly (such as when driving). She states that after the vertigo resolves she feels slightly foggy and it takes multiple minutes for her to re-engage in whatever activity she was performing previously. She also states that her body feels heavy and she doesn't have much energy. She has been the ED for these symptoms and has also seen ENT. Per note from ENT that pt brought with her they treated her with the Epley maneuver for R posterior canal BPPV. She has had a brain MRI which was normal. She has an appointment to see neurology tomorrow. Pt is currently out of work on FMLA due to her symptoms and is eager to return to work. 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 She notices that if she is walking in the hallway next to someone she feels dizzy especially if she has to turn her head to talk. Symptoms have worsened her depression due to the significant life changes they have caused. She has been unable to workout because if she gets on the treadmill it makes her feel very dizzy. Pt has tried taking meclizine but it makes her very drowsy so she is unable to take. She also gets nausea and has an  anti-emetic but hasn't had to use.  Description of dizziness: vertigo Frequency: Daily depending on whether she triggers symptoms with movement Duration: "a couple minutes" Symptom nature: intermittent Progression of symptoms since onset: worse History of similar episodes: Yes  Provocative Factors: Looking down, bending over, turning head Easing Factors: "I close my eyes and breath through it"  Auditory complaints (tinnitus, pain, drainage, hearing loss, aural fullness): Yes, right sided tinnitus, intermittent, which is new. No hearing difficulties (hearing test normal at audiologist); Vision changes (diplopia, visual field loss,  recent changes, recent eye exam): Yes, vision has been blurry but otherwise no changes Chest pain/palpitations: Yes, pt reports some chest pain and lightheadedness that she attributes to anxiety, pt reports her heart rate was high when she went to the ED for chest pain. She was advised to take magnesium daily as well as take ASA '81mg'$  History of head injury/concussion: No Stress/anxiety: Yes, pt reports significant anxiety and depression related to these symptoms; Headache/migraine: History of headaches, worsened by these symptoms.   Has patient fallen in last 6 months? No, Pertinent pain: Yes, history of new neck pain Dominant hand: right Imaging: Yes, brain MRI 05/13/21: No acute or significant abnormality. Prior level of function: Independent Occupational demands: Works at [employer]: Pt likes to exercise  [employer]: (dysarthria, dysphagia, drop attacks, bowel and bladder changes, recent weight loss/gain) Review of systems negative for red flags.   PRECAUTIONS: None  WEIGHT BEARING RESTRICTIONS No  PATIENT GOALS Pt wants to resolve her symptoms in order to return to work symptom-free    OBJECTIVE  EXAMINATION  POSTURE: No gross deficits contributing to symptoms  NEUROLOGICAL SCREEN: (2+ unless otherwise noted.) N=normal  Ab=abnormal  Level Dermatome R L Myotome R L Reflex R L  C3 Anterior Neck   Sidebend C2-3 N N Jaw CN V    C4 Top of Shoulder   Shoulder Shrug C4 N N Hoffman's UMN    C5 Lateral Upper Arm   Shoulder ABD C4-5 N N Biceps C5-6    C6 Lateral Arm/ Thumb   Arm Flex/ Wrist Ext C5-6 N N Brachiorad. C5-6    C7 Middle Finger   Arm Ext//Wrist Flex C6-7 N N Triceps C7    C8 4th & 5th Finger   Flex/ Ext Carpi Ulnaris C8 N N Patellar (L3-4)    T1 Medial Arm   Interossei T1 N N Gastrocnemius    L2 Medial thigh/groin   Illiopsoas (L2-3) N N     L3 Lower thigh/med.knee   Quadriceps (L3-4) N N     L4 Medial leg/lat thigh   Tibialis Ant (L4-5) N N     L5  Lat. leg & dorsal foot   EHL (L5) N N     S1 post/lat foot/thigh/leg   Gastrocnemius (S1-2) N N     S2 Post./med. thigh & leg   Hamstrings (L4-S3) N N       CRANIAL NERVES II, III, IV, VI: Pupils equal and reactive to light, visual acuity and visual fields are intact, extraocular muscles are intact  V: Facial sensation is intact and symmetric bilaterally  VII: Facial strength is intact and symmetric bilaterally  VIII: Hearing is normal as tested by gross conversation IX, X: Palate elevates midline, normal phonation, uvula midline XI: Shoulder shrug strength is intact  XII: Tongue protrudes midline    SOMATOSENSORY Deferred sensation testing   COORDINATION Deferred    RANGE OF MOTION Cervical Spine AROM WFL and painless in  all planes. No focal deficits in AROM noted in [place] strength WNL without focal deficits   TRANSFERS/GAIT Independent for transfers and ambulation without assistive device    PATIENT SURVEYS FOTO: 39, predicted improvement to 56 DHI: To be completed at next visit   POSTURAL CONTROL TESTS  Clinical Test of Sensory Interaction for Balance (CTSIB): Deferred   OCULOMOTOR / VESTIBULAR TESTING  Oculomotor Exam- Room Light: Deferred   Oculomotor Exam- Fixation Suppressed: DeferredBPPV TESTS:  Symptoms Duration Intensity Nystagmus  L Dix-Hallpike Mild  Brief Mild None  R Dix-Hallpike Dizziness 10s Moderate Upbeating R torsional nystagmus  L Head Roll      R Head Roll      L Sidelying Test      R Sidelying Test      (blank = not tested)   FUNCTIONAL OUTCOME MEASURES   Results Comments  BERG    DGI    FGA    TUG    5TSTS    6 Minute Walk Test    10 Meter Gait Speed    ABC Scale    DHI    (blank = not tested)   TODAY'S TREATMENT   SUBJECTIVE: Pt reports that she has been having a lot of anxiety recently. She saw her PCP yesterday who started her on Buspar. She has been performing the home Epley  maneuver intermittently but states she continues to have bouts of vertigo. The home Epley maneuver triggers her symptoms. She cancelled her VNG study until the BPPV is fully cleared. She cannot stay for the full visit today because she has to submit some paperwork that is due by 5:00.   PAIN: She has been having some neck pain and headaches recently. She does not rate the pain today.   Canalith Repositioning Treatment Right Dix-Hallpike is positive for vigorous upbeating R torsional nystagmus with concurrent vertigo. Symptoms and nystagmus last for about 10s after a 2 second latency. Pt treated with 1 bout of Epley Maneuver for presumed R posterior canal BPPV with one minute holds in each position. R sidelying test performed afterward which is negative for both vertigo and nystagmus. Pt taken through the R Liberatory maneuver with 1 minute holds in each position. Afterward performed the bow and yaw maneuver in sitting with 20s holds in each rotated position right and left followed by a headshake prior to another bout of head turns. Three bouts of head turns performed with patient in the forward bending position. Pt  provided instructions about how to perform the Liberatory maneuver for the R side at home.    PATIENT EDUCATION:  Education details: BPPV, plan of care, home CRT Person educated: Patient Education method: Explanation, Demonstration Education comprehension: verbalized understanding and returned demonstration    HOME EXERCISE TREATMENT Access Code: [email or web address] URL: [email or web address] Date: 05/23/2021 Prepared by: Roxana Hires  Exercises - Self-Epley Maneuver Right Ear  - 1 x daily - 7 x weekly - 3 reps - 60s in each position hold   ASSESSMENT:  CLINICAL IMPRESSION: RRight Dix-Hallpike is positive for vigorous upbeating R torsional nystagmus with concurrent vertigo. Symptoms and nystagmus last for about 10s after a 2 second latency. Pt treated with 1 bout of Epley  Maneuver for presumed R posterior canal BPPV with one minute holds in each position. R sidelying test performed afterward which is negative for both vertigo and nystagmus. Pt taken through the R Liberatory maneuver with 1 minute holds in each position. Afterward performed the bow  and yaw maneuver in sitting with 20s holds in each rotated position right and left followed by a headshake prior to another bout of head turns. Three bouts of head turns performed with patient in the forward bending position. Pt  provided instructions about how to perform the Liberatory maneuver for the R side at home.  Will continue to test and treat BPPV as indicated and once fully resolved will perform additional vestibular/oculomotor testing as necessary. Pt will benefit from PT services to address deficits in dizziness in order to return to full function at home, work, and with leisure activities.  REHAB POTENTIAL: Good  CLINICAL DECISION MAKING: Unstable/unpredictable  EVALUATION COMPLEXITY: Low   GOALS: Goals reviewed with patient? No  SHORT TERM GOALS: Target date: 06/28/2021  Pt will be independent with HEP for dizziness in order to decrease symptoms, improve balance,decrease fall risk, and improve function at home. Baseline: Goal status: INITIAL   LONG TERM GOALS: Target date: 07/26/2021  Pt will increase FOTO to at least 56 to demonstrate significant improvement in function at home related to dizziness.  Baseline: 05/23/21: 39 Goal status: INITIAL  2.  Pt will decrease DHI score by at least 18 points in order to demonstrate clinically significant reduction in disability related to dizziness.  Baseline: To be completed at first follow-up visit Goal status: INITIAL  3.  Pt will report 100% resolution of her vertigo in order to demonstrate clinically significant improvement in symptoms and facilitate return to work and return to exercise.      Baseline:  Goal status: INITIAL   PLAN:  PT FREQUENCY:  1-2x/week  PT DURATION: 8 weeks  PLANNED INTERVENTIONS: Therapeutic exercises, Therapeutic activity, Neuromuscular re-education, Balance training, Gait training, Patient/Family education, Joint manipulation, Joint mobilization, Canalith repositioning, Aquatic Therapy, Dry Needling, Cognitive remediation, Electrical stimulation, Spinal manipulation, Spinal mobilization, Cryotherapy, Moist heat, Traction, Ultrasound, Ionotophoresis '4mg'$ /ml Dexamethasone, and Manual therapy  PLAN FOR NEXT SESSION: Pt to complete DHI, BPPV testing and treatment as needed, review home modified Epley maneuver with patient, additional oculomotor and vestibular testing as needed/appropriate   Lyndel Safe Littie Chiem PT, DPT, GCS  Windel Keziah 05/31/2021, 5:00 PM

## 2021-06-07 NOTE — Therapy (Incomplete)
OUTPATIENT PHYSICAL THERAPY VESTIBULAR TREATMENT   Patient Name: Donna Zuniga MRN: [id number] DOB:1975-01-16, 46 y.o., female Today's Date: 06/07/2021  PCP: [name], MD REFERRING PROVIDER: [name], *       Past Medical History:  Diagnosis Date   Anxiety    Cervical dysplasia    Depression    Family history of ovarian cancer    doesn't meet [employer] genetic testing guidelines.   Heart palpitations    Dr. Posey Pronto at [facility]   Leiomyoma 2014   Pre-diabetes    Vitamin D deficiency 11/2015   Past Surgical History:  Procedure Laterality Date   COLPOSCOPY     LEEP     Patient Active Problem List   Diagnosis Date Noted   Iron deficiency anemia due to chronic blood loss 10/20/2020   COVID-19 12/28/2019   Hypertensive disorder 03/31/2019   Exposure to severe acute respiratory syndrome coronavirus 2 (SARS-CoV-2) 03/31/2019   Family history of ovarian cancer 12/17/2017   Overweight (BMI 25.0-29.9) 06/04/2017   Enlarged thyroid 12/10/2016   Nocturia 12/10/2016   Pelvic pain 09/16/2016   Uterine leiomyoma 09/16/2016   Situational mixed anxiety and depressive disorder 07/12/2016     PCP: [name], MD  REFERRING PROVIDER: [name], *  REFERRING DIAGNOSIS: Vertigo  THERAPY DIAG: Dizziness and giddiness  ONSET DATE: 02/23/21 (approximate)  FOLLOW UP APPT WITH PROVIDER: Yes    FROM INITIAL EVALUATION:  SUBJECTIVE:   Chief Complaint:  Dizziness  Pertinent History Pt reports 2-3 months of dizziness. She had one similar episode years ago but symptoms resolved. She states that she gets symptoms if she rolls over, lays flat, bends forward, or turns her head quickly (such as when driving). She states that after the vertigo resolves she feels slightly foggy and it takes multiple minutes for her to re-engage in whatever activity she was performing previously. She also states that her body feels heavy and she doesn't have  much energy. She has been the ED for these symptoms and has also seen ENT. Per note from ENT that pt brought with her they treated her with the Epley maneuver for R posterior canal BPPV. She has had a brain MRI which was normal. She has an appointment to see neurology tomorrow. Pt is currently out of work on FMLA due to her symptoms and is eager to return to work. 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 She notices that if she is walking in the hallway next to someone she feels dizzy especially if she has to turn her head to talk. Symptoms have worsened her depression due to the significant life changes they have caused. She has been unable to workout because if she gets on the treadmill it makes her feel very dizzy. Pt has tried taking meclizine but it makes her very drowsy so she is unable to take. She also gets nausea and has an anti-emetic but hasn't had to use.  Description of dizziness: vertigo Frequency: Daily depending on whether she triggers symptoms with movement Duration: "a couple minutes" Symptom nature: intermittent Progression of symptoms since onset: worse History of similar episodes: Yes  Provocative Factors: Looking down, bending over, turning head Easing Factors: "I close my eyes and breath through it"  Auditory complaints (tinnitus, pain, drainage, hearing loss, aural fullness): Yes, right sided tinnitus, intermittent, which is new. No hearing difficulties (hearing test normal at audiologist); Vision changes (diplopia, visual  field loss, recent changes, recent eye exam): Yes, vision has been blurry but otherwise no changes Chest pain/palpitations: Yes, pt reports some chest pain and lightheadedness that she attributes to anxiety, pt reports her heart rate was high when she went to the ED for chest pain. She was advised to take magnesium daily as well as take ASA '81mg'$  History of head injury/concussion:  No Stress/anxiety: Yes, pt reports significant anxiety and depression related to these symptoms; Headache/migraine: History of headaches, worsened by these symptoms.   Has patient fallen in last 6 months? No, Pertinent pain: Yes, history of new neck pain Dominant hand: right Imaging: Yes, brain MRI 05/13/21: No acute or significant abnormality. Prior level of function: Independent Occupational demands: Works at [employer]: Pt likes to exercise  [employer]: (dysarthria, dysphagia, drop attacks, bowel and bladder changes, recent weight loss/gain) Review of systems negative for red flags.   PRECAUTIONS: None  WEIGHT BEARING RESTRICTIONS No  PATIENT GOALS Pt wants to resolve her symptoms in order to return to work symptom-free    OBJECTIVE  EXAMINATION  POSTURE: No gross deficits contributing to symptoms  NEUROLOGICAL SCREEN: (2+ unless otherwise noted.) N=normal  Ab=abnormal  Level Dermatome R L Myotome R L Reflex R L  C3 Anterior Neck   Sidebend C2-3 N N Jaw CN V    C4 Top of Shoulder   Shoulder Shrug C4 N N Hoffman's UMN    C5 Lateral Upper Arm   Shoulder ABD C4-5 N N Biceps C5-6    C6 Lateral Arm/ Thumb   Arm Flex/ Wrist Ext C5-6 N N Brachiorad. C5-6    C7 Middle Finger   Arm Ext//Wrist Flex C6-7 N N Triceps C7    C8 4th & 5th Finger   Flex/ Ext Carpi Ulnaris C8 N N Patellar (L3-4)    T1 Medial Arm   Interossei T1 N N Gastrocnemius    L2 Medial thigh/groin   Illiopsoas (L2-3) N N     L3 Lower thigh/med.knee   Quadriceps (L3-4) N N     L4 Medial leg/lat thigh   Tibialis Ant (L4-5) N N     L5 Lat. leg & dorsal foot   EHL (L5) N N     S1 post/lat foot/thigh/leg   Gastrocnemius (S1-2) N N     S2 Post./med. thigh & leg   Hamstrings (L4-S3) N N       CRANIAL NERVES II, III, IV, VI: Pupils equal and reactive to light, visual acuity and visual fields are intact, extraocular muscles are intact  V: Facial sensation is intact and symmetric bilaterally  VII:  Facial strength is intact and symmetric bilaterally  VIII: Hearing is normal as tested by gross conversation IX, X: Palate elevates midline, normal phonation, uvula midline XI: Shoulder shrug strength is intact  XII: Tongue protrudes midline    SOMATOSENSORY Deferred sensation testing   COORDINATION Deferred    RANGE OF MOTION Cervical Spine AROM WFL and painless in all planes. No focal deficits in AROM noted in BUE/BLE   MANUAL MUSCLE TESTING BUE/BLE strength WNL without focal deficits   TRANSFERS/GAIT Independent for transfers and ambulation without assistive device    PATIENT SURVEYS FOTO: 39, predicted improvement to 56 DHI: To be completed at next visit   POSTURAL CONTROL TESTS  Clinical Test of Sensory Interaction for Balance (CTSIB): Deferred   OCULOMOTOR / VESTIBULAR TESTING  Oculomotor Exam- Room Light: Deferred   Oculomotor Exam- Fixation Suppressed: DeferredBPPV TESTS:  Symptoms Duration  Intensity Nystagmus  L Dix-Hallpike Mild  Brief Mild None  R Dix-Hallpike Dizziness 10s Moderate Upbeating R torsional nystagmus  L Head Roll      R Head Roll      L Sidelying Test      R Sidelying Test      (blank = not tested)   FUNCTIONAL OUTCOME MEASURES   Results Comments  BERG    DGI    FGA    TUG    5TSTS    6 Minute Walk Test    10 Meter Gait Speed    ABC Scale    DHI    (blank = not tested)   TODAY'S TREATMENT   SUBJECTIVE: Pt reports that she has been having a lot of anxiety recently. She saw her PCP yesterday who started her on Buspar. She has been performing the home Epley maneuver intermittently but states she continues to have bouts of vertigo. The home Epley maneuver triggers her symptoms. She cancelled her VNG study until the BPPV is fully cleared. She cannot stay for the full visit today because she has to submit some paperwork that is due by 5:00.   PAIN: She has been having some neck pain and headaches recently. She does not rate  the pain today.   Canalith Repositioning Treatment Right Dix-Hallpike is positive for vigorous upbeating R torsional nystagmus with concurrent vertigo. Symptoms and nystagmus last for about 10s after a 2 second latency. Pt treated with 1 bout of Epley Maneuver for presumed R posterior canal BPPV with one minute holds in each position. R sidelying test performed afterward which is negative for both vertigo and nystagmus. Pt taken through the R Liberatory maneuver with 1 minute holds in each position. Afterward performed the bow and yaw maneuver in sitting with 20s holds in each rotated position right and left followed by a headshake prior to another bout of head turns. Three bouts of head turns performed with patient in the forward bending position. Pt  provided instructions about how to perform the Liberatory maneuver for the R side at home.    PATIENT EDUCATION:  Education details: BPPV, plan of care, home CRT Person educated: Patient Education method: Explanation, Demonstration Education comprehension: verbalized understanding and returned demonstration    HOME EXERCISE TREATMENT Access Code: [email or web address] URL: [email or web address] Date: 05/23/2021 Prepared by: Roxana Hires  Exercises - Self-Epley Maneuver Right Ear  - 1 x daily - 7 x weekly - 3 reps - 60s in each position hold   ASSESSMENT:  CLINICAL IMPRESSION: RRight Dix-Hallpike is positive for vigorous upbeating R torsional nystagmus with concurrent vertigo. Symptoms and nystagmus last for about 10s after a 2 second latency. Pt treated with 1 bout of Epley Maneuver for presumed R posterior canal BPPV with one minute holds in each position. R sidelying test performed afterward which is negative for both vertigo and nystagmus. Pt taken through the R Liberatory maneuver with 1 minute holds in each position. Afterward performed the bow and yaw maneuver in sitting with 20s holds in each rotated position right and left followed  by a headshake prior to another bout of head turns. Three bouts of head turns performed with patient in the forward bending position. Pt  provided instructions about how to perform the Liberatory maneuver for the R side at home.  Will continue to test and treat BPPV as indicated and once fully resolved will perform additional vestibular/oculomotor testing as necessary. Pt will benefit from PT services to address deficits in dizziness  in order to return to full function at home, work, and with leisure activities.  REHAB POTENTIAL: Good  CLINICAL DECISION MAKING: Unstable/unpredictable  EVALUATION COMPLEXITY: Low   GOALS: Goals reviewed with patient? No  SHORT TERM GOALS: Target date: 07/05/2021  Pt will be independent with HEP for dizziness in order to decrease symptoms, improve balance,decrease fall risk, and improve function at home. Baseline: Goal status: INITIAL   LONG TERM GOALS: Target date: 08/02/2021  Pt will increase FOTO to at least 56 to demonstrate significant improvement in function at home related to dizziness.  Baseline: 05/23/21: 39 Goal status: INITIAL  2.  Pt will decrease DHI score by at least 18 points in order to demonstrate clinically significant reduction in disability related to dizziness.  Baseline: To be completed at first follow-up visit Goal status: INITIAL  3.  Pt will report 100% resolution of her vertigo in order to demonstrate clinically significant improvement in symptoms and facilitate return to work and return to exercise.      Baseline:  Goal status: INITIAL   PLAN:  PT FREQUENCY: 1-2x/week  PT DURATION: 8 weeks  PLANNED INTERVENTIONS: Therapeutic exercises, Therapeutic activity, Neuromuscular re-education, Balance training, Gait training, Patient/Family education, Joint manipulation, Joint mobilization, Canalith repositioning, Aquatic Therapy, Dry Needling, Cognitive remediation, Electrical stimulation, Spinal manipulation, Spinal mobilization,  Cryotherapy, Moist heat, Traction, Ultrasound, Ionotophoresis '4mg'$ /ml Dexamethasone, and Manual therapy  PLAN FOR NEXT SESSION: Pt to complete DHI, BPPV testing and treatment as needed, review home modified Epley maneuver with patient, additional oculomotor and vestibular testing as needed/appropriate   Lyndel Safe Debbera Wolken PT, DPT, GCS  Merikay Lesniewski 06/07/2021, 11:23 AM

## 2021-07-18 ENCOUNTER — Ambulatory Visit: Payer: Federal, State, Local not specified - PPO | Attending: Physician Assistant

## 2021-07-18 DIAGNOSIS — R42 Dizziness and giddiness: Secondary | ICD-10-CM | POA: Insufficient documentation

## 2021-07-24 DIAGNOSIS — R42 Dizziness and giddiness: Secondary | ICD-10-CM

## 2021-07-26 ENCOUNTER — Ambulatory Visit: Payer: Federal, State, Local not specified - PPO | Attending: Physician Assistant

## 2021-07-26 DIAGNOSIS — R42 Dizziness and giddiness: Secondary | ICD-10-CM | POA: Insufficient documentation

## 2021-07-26 NOTE — Therapy (Signed)
OUTPATIENT PHYSICAL THERAPY VESTIBULAR TREATMENT/RECERTIFICATION  Patient Name: Donna Zuniga MRN: [id number] DOB:[birth year], 46 y.o., female Today's Date: 07/27/2021  PCP: [name], MD REFERRING PROVIDER: Wonda Cerise, PA-C   PT End of Session - 07/26/21 1319     Visit Number 4    Number of Visits 25    Date for PT Re-Evaluation 09/21/21    Authorization Type eval: 05/23/21    PT Start Time 1320    PT Stop Time 1400    PT Time Calculation (min) 40 min    Activity Tolerance Patient tolerated treatment well    Behavior During Therapy [facility] for tasks assessed/performed                Past Medical History:  Diagnosis Date   Anxiety    Cervical dysplasia    Depression    Family history of ovarian cancer    doesn't meet BCBS Fed genetic testing guidelines.   Heart palpitations    Dr. Posey Pronto at [facility]   Leiomyoma 2014   Pre-diabetes    Vitamin D deficiency 11/2015   Past Surgical History:  Procedure Laterality Date   COLPOSCOPY     LEEP     Patient Active Problem List   Diagnosis Date Noted   Iron deficiency anemia due to chronic blood loss 10/20/2020   COVID-19 12/28/2019   Hypertensive disorder 03/31/2019   Exposure to severe acute respiratory syndrome coronavirus 2 (SARS-CoV-2) 03/31/2019   Family history of ovarian cancer 12/17/2017   Overweight (BMI 25.0-29.9) 06/04/2017   Enlarged thyroid 12/10/2016   Nocturia 12/10/2016   Pelvic pain 09/16/2016   Uterine leiomyoma 09/16/2016   Situational mixed anxiety and depressive disorder 07/12/2016     PCP: [name], MD  REFERRING PROVIDER: Wonda Cerise, PA-C  REFERRING DIAGNOSIS: Vertigo  THERAPY DIAG: Dizziness and giddiness  ONSET DATE: 02/23/21 (approximate)  FOLLOW UP APPT WITH PROVIDER: Yes    FROM INITIAL EVALUATION:  SUBJECTIVE:   Chief Complaint:  Dizziness  Pertinent History Pt reports 2-3 months of dizziness. She had one similar episode years ago but  symptoms resolved. She states that she gets symptoms if she rolls over, lays flat, bends forward, or turns her head quickly (such as when driving). She states that after the vertigo resolves she feels slightly foggy and it takes multiple minutes for her to re-engage in whatever activity she was performing previously. She also states that her body feels heavy and she doesn't have much energy. She has been the ED for these symptoms and has also seen ENT. Per note from ENT that pt brought with her they treated her with the Epley maneuver for R posterior canal BPPV. She has had a brain MRI which was normal. She has an appointment to see neurology tomorrow. Pt is currently out of work on FMLA due to her symptoms and is eager to return to work. 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 She notices that if she is walking in the hallway next to someone she feels dizzy especially if she has to turn her head to talk. Symptoms have worsened her depression due to the significant life changes they have caused. She has been unable to workout because if she gets on the treadmill it makes her feel very dizzy. Pt has tried taking meclizine but it makes her very drowsy so she is unable to take. She also gets nausea and has an  anti-emetic but hasn't had to use.  Description of dizziness: vertigo Frequency: Daily depending on whether she triggers symptoms with movement Duration: "a couple minutes" Symptom nature: intermittent Progression of symptoms since onset: worse History of similar episodes: Yes  Provocative Factors: Looking down, bending over, turning head Easing Factors: "I close my eyes and breath through it"  Auditory complaints (tinnitus, pain, drainage, hearing loss, aural fullness): Yes, right sided tinnitus, intermittent, which is new. No hearing difficulties (hearing test normal at audiologist); Vision changes (diplopia, visual  field loss, recent changes, recent eye exam): Yes, vision has been blurry but otherwise no changes Chest pain/palpitations: Yes, pt reports some chest pain and lightheadedness that she attributes to anxiety, pt reports her heart rate was high when she went to the ED for chest pain. She was advised to take magnesium daily as well as take ASA $Remove'81mg'[id number]$  History of head injury/concussion: No Stress/anxiety: Yes, pt reports significant anxiety and depression related to these symptoms; Headache/migraine: History of headaches, worsened by these symptoms.   Has patient fallen in last 6 months? No, Pertinent pain: Yes, history of new neck pain Dominant hand: right Imaging: Yes, brain MRI 05/13/21: No acute or significant abnormality. Prior level of function: Independent Occupational demands: Works at [employer]: Pt likes to exercise  [employer]: (dysarthria, dysphagia, drop attacks, bowel and bladder changes, recent weight loss/gain) Review of systems negative for red flags.   PRECAUTIONS: None  WEIGHT BEARING RESTRICTIONS No  PATIENT GOALS Pt wants to resolve her symptoms in order to return to work symptom-free    OBJECTIVE  EXAMINATION  POSTURE: No gross deficits contributing to symptoms  NEUROLOGICAL SCREEN: (2+ unless otherwise noted.) N=normal  Ab=abnormal  Level Dermatome R L Myotome R L Reflex R L  C3 Anterior Neck   Sidebend C2-3 N N Jaw CN V    C4 Top of Shoulder   Shoulder Shrug C4 N N Hoffman's UMN    C5 Lateral Upper Arm   Shoulder ABD C4-5 N N Biceps C5-6    C6 Lateral Arm/ Thumb   Arm Flex/ Wrist Ext C5-6 N N Brachiorad. C5-6    C7 Middle Finger   Arm Ext//Wrist Flex C6-7 N N Triceps C7    C8 4th & 5th Finger   Flex/ Ext Carpi Ulnaris C8 N N Patellar (L3-4)    T1 Medial Arm   Interossei T1 N N Gastrocnemius    L2 Medial thigh/groin   Illiopsoas (L2-3) N N     L3 Lower thigh/med.knee   Quadriceps (L3-4) N N     L4 Medial leg/lat thigh   Tibialis Ant (L4-5)  N N     L5 Lat. leg & dorsal foot   EHL (L5) N N     S1 post/lat foot/thigh/leg   Gastrocnemius (S1-2) N N     S2 Post./med. thigh & leg   Hamstrings (L4-S3) N N       CRANIAL NERVES II, III, IV, VI: Pupils equal and reactive to light, visual acuity and visual fields are intact, extraocular muscles are intact  V: Facial sensation is intact and symmetric bilaterally  VII: Facial strength is intact and symmetric bilaterally  VIII: Hearing is normal as tested by gross conversation IX, X: Palate elevates midline, normal phonation, uvula midline XI: Shoulder shrug strength is intact  XII: Tongue protrudes midline    SOMATOSENSORY Deferred sensation testing   COORDINATION Deferred    RANGE OF MOTION Cervical Spine AROM WFL and painless in  all planes. No focal deficits in AROM noted in BUE/BLE   MANUAL MUSCLE TESTING BUE/BLE strength WNL without focal deficits   TRANSFERS/GAIT Independent for transfers and ambulation without assistive device    PATIENT SURVEYS FOTO: 39, predicted improvement to 56 DHI: To be completed at next visit   POSTURAL CONTROL TESTS  Clinical Test of Sensory Interaction for Balance (CTSIB): Deferred   OCULOMOTOR / VESTIBULAR TESTING  Oculomotor Exam- Room Light: Deferred   Oculomotor Exam- Fixation Suppressed: DeferredBPPV TESTS:  Symptoms Duration Intensity Nystagmus  L Dix-Hallpike Mild  Brief Mild None  R Dix-Hallpike Dizziness 10s Moderate Upbeating R torsional nystagmus  L Head Roll      R Head Roll      L Sidelying Test      R Sidelying Test      (blank = not tested)   FUNCTIONAL OUTCOME MEASURES   Results Comments  BERG    DGI    FGA    TUG    5TSTS    6 Minute Walk Test    10 Meter Gait Speed    ABC Scale    DHI    (blank = not tested)   TODAY'S TREATMENT    SUBJECTIVE: Pt reports that her vertigo has been worsening. She continues having significant anxiety and has transitioned out of her last job. She is  planning to call and schedule her VNG study with ENT.  PAIN: She continues with neck pain and headaches. She does not rate the pain today.   Canalith Repositioning Treatment L Dix-Hallpike is positive for mild vertigo with apogeotrophic pure horizontal nystagmus which lasts for at least 60 seconds. Performed Roll Test which is also positive bilaterally for pure horizontal apogeotrophic nystagmus, significantly worse on the right. Started Gufoni manuver for presumed L horizontal cupulolithiasis (sidelying on L side) however when pt layed onto her L side she starts with pure horizontal geotrophic nystagmus and vertigo. Pt returned to sitting and laid down on her R side and has  pure horizontal geotrophic nystagmus and vertigo which is more severe. Completed Gufoni maneuver on R side with 30s hold in sidelying and 1 minute hold in nose down position. Repeated Roll Test on the R side is negative, L side is positive for pure horizontal geotrophic nystagmus with concurrent vertigo. Performed Lempert roll for presumed L horizontal canalithiasis with 45s holds in each position. Roll test performed afterward and on the R pt has 5-8 beats of L horizontal apogeotrophic nystagmus with very mild vertigo. On the L side pt has pure L horizontal geoptrophic nystamgus which is much more vigorous and vertigo is more severe. Performed another Gufoni maneuver for presumed L horizontal canalithiasis with 30s hold in R sidelying position and 1 minute hold in nose down position. Repeated Roll Test and [employer] Tests which are negative for both vertigo and nystagmus in all positions.    Updated outcome measures with patient; FOTO: 21 DHI: 58/100  PATIENT EDUCATION:  Education details: BPPV, plan of care Person educated: Patient Education method: Explanation, Demonstration Education comprehension: verbalized understanding and returned demonstration    HOME EXERCISE TREATMENT Access Code: [email or web address] URL:  [email or web address] Date: 05/23/2021 Prepared by: Roxana Hires  Exercises - Self-Epley Maneuver Right Ear  - 1 x daily - 7 x weekly - 3 reps - 60s in each position hold   ASSESSMENT:  CLINICAL IMPRESSION: L Dix-Hallpike is positive for mild vertigo with apogeotrophic pure horizontal nystagmus which lasts for at least 60 seconds. Performed Roll Test  which is also positive bilaterally for pure horizontal apogeotrophic nystagmus, significantly worse on the right. Started Gufoni manuver for presumed L horizontal cupulolithiasis (sidelying on L side) however when pt layed onto her L side she starts with pure horizontal geotrophic nystagmus and vertigo. Pt returned to sitting and laid down on her R side and has  pure horizontal geotrophic nystagmus and vertigo which is more severe. Completed Gufoni maneuver on R side with 30s hold in sidelying and 1 minute hold in nose down position. Repeated Roll Test on the R side is negative, L side is positive for pure horizontal geotrophic nystagmus with concurrent vertigo. Performed Lempert roll for presumed L horizontal canalithiasis with 45s holds in each position. Roll test performed afterward and on the R pt has 5-8 beats of L horizontal apogeotrophic nystagmus with very mild vertigo. On the L side pt has pure L horizontal geoptrophic nystamgus which is much more vigorous and vertigo is more severe. Performed another Gufoni maneuver for presumed L horizontal canalithiasis with 30s hold in R sidelying position and 1 minute hold in nose down position. Repeated Roll Test and [employer] Tests which are negative for both vertigo and nystagmus in all positions. After finished with CRT updated outcome measures. FOTO has improved from initial evaluation to 46. DHI is currently 58/100 indicated moderate self-reported disability. Unfortunately pt has only been able to attend 2 treatment visits prior to today's visit so improvement has been very limited. She  does achieve full resolution of vertigo and nystagmus with BPPV testing at end of session today so therapist is hopeful that she will experience notable benefit after today's visit. Pt does become tearful during session today secondary to frustration regarding continued symptoms. She will benefit from PT services to address deficits in dizziness in order to return to full function at home.   REHAB POTENTIAL: Good  CLINICAL DECISION MAKING: Unstable/unpredictable  EVALUATION COMPLEXITY: Low   GOALS: Goals reviewed with patient? No  SHORT TERM GOALS: Target date: 08/24/2021   Pt will be independent with HEP for dizziness in order to decrease symptoms, improve balance,decrease fall risk, and improve function at home. Baseline: Goal status: INITIAL   LONG TERM GOALS: Target date: 09/21/2021   Pt will increase FOTO to at least 56 to demonstrate significant improvement in function at home related to dizziness.  Baseline: 05/23/21: 39; 07/27/21: 46 Goal status: PARTIALLY MET  2.  Pt will decrease DHI score by at least 18 points in order to demonstrate clinically significant reduction in disability related to dizziness.  Baseline: To be completed at first follow-up visit; 07/27/21: 58/100 Goal status: INITIAL  3.  Pt will report 100% resolution of her vertigo in order to demonstrate clinically significant improvement in symptoms and facilitate return to work and return to exercise.      Baseline: 07/27/21: Pt reports symptoms are worsening Goal status: INITIAL   PLAN:  PT FREQUENCY: 1-2x/week  PT DURATION: 8 weeks  PLANNED INTERVENTIONS: Therapeutic exercises, Therapeutic activity, Neuromuscular re-education, Balance training, Gait training, Patient/Family education, Joint manipulation, Joint mobilization, Canalith repositioning, Aquatic Therapy, Dry Needling, Cognitive remediation, Electrical stimulation, Spinal manipulation, Spinal mobilization, Cryotherapy, Moist heat, Traction,  Ultrasound, Ionotophoresis 4mg /ml Dexamethasone, and Manual therapy  PLAN FOR NEXT SESSION: BPPV testing and treatment as needed, additional oculomotor and vestibular testing as needed/appropriate   Lyndel Safe Curties Conigliaro PT, DPT, GCS  Mckenzye Cutright 07/27/2021, 8:20 AM

## 2021-07-31 ENCOUNTER — Ambulatory Visit: Payer: Federal, State, Local not specified - PPO

## 2021-07-31 DIAGNOSIS — R42 Dizziness and giddiness: Secondary | ICD-10-CM

## 2021-08-02 ENCOUNTER — Ambulatory Visit: Payer: Federal, State, Local not specified - PPO

## 2021-09-03 ENCOUNTER — Telehealth: Payer: Self-pay | Admitting: Obstetrics and Gynecology

## 2021-09-03 DIAGNOSIS — Z1231 Encounter for screening mammogram for malignant neoplasm of breast: Secondary | ICD-10-CM

## 2021-09-03 NOTE — Telephone Encounter (Signed)
Mammo order placed so pt can schedule mammo now. Referral placed to GI 1/23. She can call [facility] back again--had appt for 4/23.

## 2021-09-03 NOTE — Telephone Encounter (Signed)
This pt is scheduled for her annual with you on 10/17.  She would like to go ahead and schedule her mammogram for around the same time.  Will you put in an order for the mammogram? She is also needing a new referral for a colonoscopy, bc she wasn't able to do it earlier.

## 2021-09-04 NOTE — Telephone Encounter (Signed)
Pt aware.

## 2021-10-22 NOTE — Progress Notes (Unsigned)
PCP:  [name], MD   No chief complaint on file.    HPI:      [name] is a 46 y.o. No obstetric history on file. who LMP was No LMP recorded. Patient has had an ablation., presents today for her annual examination.  Her menses are now absent on aygestin. Has BTB if misses pills. Was having AUB and diagnosed with 3 leio 4/21. Now s/p [place] due to [facility], was on depo lupron, changed to aygestin daily. Leio decreasing in size on last u/s; has f/u GYN u/s 10/23/20 and f/u with Dr. Georgianne Fick 10/31/20. 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 Was on [facility] prior to AUB and did well. Hx of HTN.  Cont aygestin CBC WNL 4/23  Sex activity: single partner, contraception - on aygestin daily. No pain/bleeding. Last Pap: 10/20/20 Results were: neg/neg HPV DNA. Likes yearly paps. Hx of STDs: HPV with LEEP in past about age 76; normal paps since  Last mammogram: 10/16/20 Results were normal, repeat in 12 months. Has appt 10/23 There is a FH of breast cancer in 2 maternal cousins. There is a FH of ovarian cancer in her mat aunt, genetic testing hasn't been done because pt doesn't meet [employer] guidelines in past, but now qualifies. Pt declined genetic testing at last annual. The patient does do self-breast exams.  Tobacco use: Quit tobacco several yrs ago Alcohol use: none No drug use.  Exercise: min active  Colonoscopy: never; had appt with Dr. Allen Norris, canceled 4/23??  She does get adequate calcium but not Vitamin D in her diet.  She has a hx of enlarged thyroid, followed by PCP. Other labs with PCP, too.   Pt with hx of depression in past, mostly due to grief/loss. Struggles every yr around anniversary of her father's passing. Hard to get motivated to exercise. Doesn't feel well on SSRIs. Is seeing therapist.    Past Medical History:  Diagnosis Date   Anxiety    Cervical dysplasia    Depression    Family history of ovarian cancer     doesn't meet BCBS Fed genetic testing guidelines.   Heart palpitations    Dr. Posey Pronto at [facility]   Leiomyoma 2014   Pre-diabetes    Vitamin D deficiency 11/2015    Past Surgical History:  Procedure Laterality Date   COLPOSCOPY     LEEP      Family History  Problem Relation Age of Onset   Ovarian cancer Maternal Aunt 24   Hypertension Father    Other Mother        fibroids/leiomyoma of uterus   Breast cancer Cousin    Breast cancer Cousin     Social History   Socioeconomic History   Marital status: Married    Spouse name: Not on file   Number of children: Not on file   Years of education: Not on file   Highest education level: Not on file  Occupational History   Not on file  Tobacco Use   Smoking status: Former   Smokeless tobacco: Never  Vaping Use   Vaping Use: Never used  Substance and Sexual Activity   Alcohol use: Yes    Comment: occ   Drug use: No   Sexual activity: Yes    Birth control/protection: Pill  Other Topics Concern   Not on file  Social History Narrative   Not on file   Social Determinants of  Health   Financial Resource Strain: Not on file  Food Insecurity: Not on file  Transportation Needs: Not on file  Physical Activity: Not on file  Stress: Not on file  Social Connections: Not on file  Intimate Partner Violence: Not on file    No outpatient medications have been marked as taking for the 10/23/21 encounter (Appointment) with [place]ise Esch, Deirdre Evener, PA-C.     ROS:  Review of Systems  Constitutional:  Negative for fatigue, fever and unexpected weight change.  Respiratory:  Negative for cough, shortness of breath and wheezing.   Cardiovascular:  Negative for chest pain, palpitations and leg swelling.  Gastrointestinal:  Negative for blood in stool, constipation, diarrhea, nausea and vomiting.  Endocrine: Negative for cold intolerance, heat intolerance and polyuria.  Genitourinary:  Negative for dyspareunia, dysuria, flank pain, frequency,  genital sores, hematuria, menstrual problem, pelvic pain, urgency, vaginal bleeding, vaginal discharge and vaginal pain.  Musculoskeletal:  Negative for back pain, joint swelling and myalgias.  Skin:  Negative for rash.  Neurological:  Negative for dizziness, syncope, light-headedness, numbness and headaches.  Hematological:  Negative for adenopathy.  Psychiatric/Behavioral:  Positive for dysphoric mood. Negative for agitation, confusion, sleep disturbance and suicidal ideas. The patient is not nervous/anxious.      Objective: There were no vitals taken for this visit. Pt got in argument before appt. Normal BP at 9/20 appt and at Urgent care 12/20.   Physical Exam Constitutional:      Appearance: She is well-developed.  Genitourinary:     Vulva normal.     Right Labia: No rash, tenderness or lesions.    Left Labia: No tenderness, lesions or rash.    No vaginal discharge, erythema or tenderness.      Right Adnexa: not tender and no mass present.    Left Adnexa: not tender and no mass present.    No cervical motion tenderness, friability or polyp.     Uterus is not enlarged or tender.  Breasts:    Right: No mass, nipple discharge, skin change or tenderness.     Left: No mass, nipple discharge, skin change or tenderness.  Neck:     Thyroid: Thyromegaly present.  Cardiovascular:     Rate and Rhythm: Normal rate and regular rhythm.     Heart sounds: Normal heart sounds. No murmur heard. Pulmonary:     Effort: Pulmonary effort is normal.     Breath sounds: Normal breath sounds.  Abdominal:     Palpations: Abdomen is soft.     Tenderness: There is no abdominal tenderness. There is no guarding or rebound.  Musculoskeletal:        General: Normal range of motion.     Cervical back: Normal range of motion.  Lymphadenopathy:     Cervical: No cervical adenopathy.  Neurological:     General: No focal deficit present.     Mental Status: She is alert and oriented to person, place, and  time.     Cranial Nerves: No cranial nerve deficit.  Skin:    General: Skin is warm and dry.  Psychiatric:        Mood and Affect: Mood normal.        Behavior: Behavior normal.        Thought Content: Thought content normal.        Judgment: Judgment normal.  Vitals reviewed.     Assessment/Plan: Encounter for annual routine gynecological examination  Cervical cancer screening - Plan: Cytology - PAP  Screening  for HPV (human papillomavirus) - Plan: Cytology - PAP  Encounter for screening mammogram for malignant neoplasm of breast--pt current on mammo  Family history of ovarian cancer--MyRisk testing discussed and pt to consider. Will f/u prn.   Screening for colon cancer--colonoscopy and cologuard discussed. Pt to consider and f/u with decision next wk. Will do ref prn.  Uterine leiomyoma, unspecified location--sx improved. Has f/u Gyn u/s next wk and then f/u with Dr. Georgianne Fick. Unsure if he'll have pt cont aygestin vs change back to POPs. Will depend on u/s results   Iron deficiency anemia due to chronic blood loss - Plan: CBC with Differential/Platelet  Blood tests for routine general physical examination - Plan: Comprehensive metabolic panel, CBC with Differential/Platelet    GYN counsel breast self exam, mammography screening, adequate intake of calcium and vitamin D, diet and exercise     F/U  No follow-ups on file.  Candies Palm B. Malakye Nolden, PA-C 10/22/2021 10:01 AM

## 2021-10-23 ENCOUNTER — Encounter: Payer: Self-pay | Admitting: Obstetrics and Gynecology

## 2021-10-23 ENCOUNTER — Ambulatory Visit (INDEPENDENT_AMBULATORY_CARE_PROVIDER_SITE_OTHER): Payer: Federal, State, Local not specified - PPO | Admitting: Obstetrics and Gynecology

## 2021-10-23 VITALS — BP 112/80 | Ht 68.0 in | Wt 194.0 lb

## 2021-10-23 DIAGNOSIS — D251 Intramural leiomyoma of uterus: Secondary | ICD-10-CM

## 2021-10-23 DIAGNOSIS — Z01419 Encounter for gynecological examination (general) (routine) without abnormal findings: Secondary | ICD-10-CM | POA: Diagnosis not present

## 2021-10-23 DIAGNOSIS — Z8041 Family history of malignant neoplasm of ovary: Secondary | ICD-10-CM

## 2021-10-23 DIAGNOSIS — N62 Hypertrophy of breast: Secondary | ICD-10-CM | POA: Diagnosis not present

## 2021-10-23 DIAGNOSIS — Z1211 Encounter for screening for malignant neoplasm of colon: Secondary | ICD-10-CM

## 2021-10-23 DIAGNOSIS — Z1231 Encounter for screening mammogram for malignant neoplasm of breast: Secondary | ICD-10-CM

## 2021-10-23 MED ORDER — NORETHINDRONE ACETATE 5 MG PO TABS: ORAL_TABLET | ORAL | 3 refills | Status: DC

## 2021-10-23 NOTE — Patient Instructions (Signed)
I value your feedback and you entrusting us with your care. If you get a [place] patient survey, I would appreciate you taking the time to let us know about your experience today. Thank you! ? ? ?

## 2021-10-25 ENCOUNTER — Ambulatory Visit
Admission: RE | Admit: 2021-10-25 | Discharge: 2021-10-25 | Disposition: A | Discharge status: Home / Self Care | Payer: Federal, State, Local not specified - PPO | Source: Ambulatory Visit | Attending: Obstetrics and Gynecology | Admitting: Obstetrics and Gynecology

## 2021-10-25 DIAGNOSIS — Z1231 Encounter for screening mammogram for malignant neoplasm of breast: Secondary | ICD-10-CM | POA: Diagnosis present

## 2021-10-29 ENCOUNTER — Other Ambulatory Visit: Payer: Self-pay | Admitting: Obstetrics and Gynecology

## 2021-10-29 ENCOUNTER — Encounter: Payer: Self-pay | Admitting: Obstetrics and Gynecology

## 2021-10-29 DIAGNOSIS — R928 Other abnormal and inconclusive findings on diagnostic imaging of breast: Secondary | ICD-10-CM

## 2021-10-29 DIAGNOSIS — N6489 Other specified disorders of breast: Secondary | ICD-10-CM

## 2021-11-06 ENCOUNTER — Inpatient Hospital Stay: Admission: RE | Admit: 2021-11-06 | Payer: Federal, State, Local not specified - PPO | Source: Ambulatory Visit

## 2021-11-06 ENCOUNTER — Other Ambulatory Visit: Payer: Federal, State, Local not specified - PPO

## 2021-11-15 DIAGNOSIS — E876 Hypokalemia: Secondary | ICD-10-CM | POA: Insufficient documentation

## 2022-01-11 ENCOUNTER — Encounter: Payer: Self-pay | Admitting: Obstetrics and Gynecology

## 2022-01-11 DIAGNOSIS — Z1211 Encounter for screening for malignant neoplasm of colon: Secondary | ICD-10-CM

## 2022-01-14 DIAGNOSIS — M76891 Other specified enthesopathies of right lower limb, excluding foot: Secondary | ICD-10-CM | POA: Insufficient documentation

## 2022-01-17 ENCOUNTER — Telehealth: Payer: Self-pay

## 2022-01-17 ENCOUNTER — Other Ambulatory Visit: Payer: Self-pay

## 2022-01-17 DIAGNOSIS — Z1211 Encounter for screening for malignant neoplasm of colon: Secondary | ICD-10-CM

## 2022-01-17 NOTE — Telephone Encounter (Signed)
Gastroenterology Pre-Procedure Review  Request Date: 01/31/22 Requesting Physician: Dr. Marius Ditch  PATIENT REVIEW QUESTIONS: The patient responded to the following health history questions as indicated:    1. Are you having any GI issues? no 2. Do you have a personal history of Polyps? no 3. Do you have a family history of Colon Cancer or Polyps? no 4. Diabetes Mellitus? no she is currently taking Mancel Parsons has been asked to stop it 7 days prior to colonsocopy 5. Joint replacements in the past 12 months?no 6. Major health problems in the past 3 months?no 7. Any artificial heart valves, MVP, or defibrillator?no    MEDICATIONS & ALLERGIES:    Patient reports the following regarding taking any anticoagulation/antiplatelet therapy:   Plavix, Coumadin, Eliquis, Xarelto, Lovenox, Pradaxa, Brilinta, or Effient? no Aspirin? no  Patient confirms/reports the following medications:  Current Outpatient Medications  Medication Sig Dispense Refill   diazepam (VALIUM) 5 MG tablet Take by mouth.     hydrochlorothiazide (HYDRODIURIL) 12.5 MG tablet Take 12.5 mg by mouth daily. (Patient not taking: Reported on 10/23/2021)     hydrochlorothiazide (MICROZIDE) 12.5 MG capsule Take by mouth.     hydrOXYzine (ATARAX) 25 MG tablet Take 25 mg by mouth 3 (three) times daily.     norethindrone (AYGESTIN) 5 MG tablet TAKE 1 TABLET(5 MG) BY MOUTH DAILY 90 tablet 3   WEGOVY 1 MG/0.5ML SOAJ Inject 1 mg into the skin once a week.     No current facility-administered medications for this visit.    Patient confirms/reports the following allergies:  Allergies  Allergen Reactions   Bupropion Other (See Comments)    No orders of the defined types were placed in this encounter.   AUTHORIZATION INFORMATION Primary Insurance: 1D#: Group #:  Secondary Insurance: 1D#: Group #:  SCHEDULE INFORMATION: Date: 01/31/22 Time: Location: [place]

## 2022-01-21 ENCOUNTER — Telehealth: Payer: Self-pay

## 2022-01-21 NOTE — Telephone Encounter (Signed)
Patient lvm requesting to reschedule her 01/31/22 colonoscopy scheduled with Dr. Marius Ditch at [facility].  Returned phone call asked her to call office back to reschedule.  Thanks, [place], [place]

## 2022-04-05 ENCOUNTER — Telehealth: Payer: Self-pay

## 2022-04-05 ENCOUNTER — Ambulatory Visit
Admission: EM | Admit: 2022-04-05 | Discharge: 2022-04-05 | Disposition: A | Discharge status: Home / Self Care | Payer: Federal, State, Local not specified - PPO

## 2022-04-05 DIAGNOSIS — R0981 Nasal congestion: Secondary | ICD-10-CM | POA: Diagnosis not present

## 2022-04-05 DIAGNOSIS — R051 Acute cough: Secondary | ICD-10-CM

## 2022-04-05 DIAGNOSIS — J069 Acute upper respiratory infection, unspecified: Secondary | ICD-10-CM

## 2022-04-05 NOTE — Discharge Instructions (Signed)
-  Your symptoms are consistent with a viral upper respiratory infection at this time. Antibiotics are not indicated at this time. You are negative strep. You do not have an ear infection and no signs of bronchitis/pneumonia. Azithromycin will not be helpful. -As discussed your symptoms will have to run their course which usually takes 1-2 weeks. Continue with over the counter medications since you have declined prescriptions for nasal sprays and cough medicines today. -If you develop a fever, severe sore throat, worsening cough, breathing difficulty or are not better after 2 weeks please see PCP or return for re-evaluation. For any acute worsening of symptoms, please go to the ER. -If you would like to contact a manager regarding your visit today, you may contact: [email or web address] or call her at [phone]. I hope you feel better soon.

## 2022-04-05 NOTE — ED Notes (Addendum)
Pt came out of her room at the end of her visit and began to walk around the nurse station saying excuse me. Jerene Pitch came out of the provider room with the patients paperwork and attempted to hand the papers to the pt. Pt did not acknowledge Jerene Pitch and stated that she had a question. Pt came to the front of the nurse station and [place] placed the papers down on the front counter where they then slid off the counter and landed on the floor. Pt then got upset and stated that Jerene Pitch has been very unprofessional and threw the papers at her. Brooke came around to the other side and picked up the papers and the pt stated that she will not take the papers and that she does not want to interact with her anymore. Brooke then handed me the papers and asked me to give them to the patient before going to the provider room and closing the door. The pt stated that Jerene Pitch was extremely rude and that she needed to talk to a Midwife and if there was anything that can be done about a rude provider. I asked the patient to return to her room and I will speak to her there. Pt returned to room 3 and stated that the PA has been extremely rude and that She works in healthcare and has been nothing but professional. Pt requested the name of our nurse manager and asked why our manager is not here on site to speak to her. Pt states that she wasted her time coming here because she asked for an antibiotic and the provider would not give one. Pt stated that Lanice Schwab reply was rude and that she said "Do you think I give an antibiotic to anyone who asks?". 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 She then asked me why we can not give antibiotics to patients who ask for one. I stated that the providers can not give antibiotics per asking and they need a reason to prescribe to be sure antibiotics will be effective. Pt then stated that she needs to know what to do as she is  still having symptoms and nothing she takes is helping her. I informed the patient that she can take OTC medication for congestion like tussin DM or Nasal rinse to help relieve her facial pressure and to not use too much of her sudafed and flonase. Pt states that she goes to see her mom next Wednesday and wanted to be better. I read her paperwork and explained viral infections and how long they could last and how they need to run their course. 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 Brooke had given the contact information to Economist on the discharge papers and the patient stated that she will be leaving a review and expects follow up. Pt verbalized understanding and thanked me before leaving.

## 2022-04-05 NOTE — ED Provider Notes (Signed)
[facility]    CSN: [id number] Arrival date & time: 04/05/22  1212      History   Chief Complaint Chief Complaint  Patient presents with   Facial Pain   Ear Problem    HPI Donna Zuniga is a 47 y.o. female presenting for 4-5 day history of nasal congestion, sore throat, productive cough, fatigue, sinus pressure, and bilateral ear popping/pressure. Denies fever, fatigue, body aches, weakness, headaches, chest pain, shortness of breath, nausea/vomiting/diarrhea. Recent travel to and from [place]. Has been taking multiple over the counter medications including Sudafed. Claritin and Flonase. Sore throat has improved but other symptoms have not. No worsening of symptoms.   Had negative strep testing and viral respiratory panel (COVID/flu/RSV) at PCP office 3 days ago. States she needs azithromycin at this time for what she suspects is a sinus infection because "I know my body" and over the counter medications have not made symptoms go away. No other concerns.  HPI  Past Medical History:  Diagnosis Date   Anxiety    Cervical dysplasia    Depression    Family history of ovarian cancer    doesn't meet [employer] genetic testing guidelines.   Heart palpitations    Dr. Posey Pronto at [facility]   Leiomyoma 2014   Pre-diabetes    Vitamin D deficiency 11/2015    Patient Active Problem List   Diagnosis Date Noted   Iron deficiency anemia due to chronic blood loss 10/20/2020   COVID-19 12/28/2019   Hypertensive disorder 03/31/2019   Exposure to severe acute respiratory syndrome coronavirus 2 (SARS-CoV-2) 03/31/2019   Family history of ovarian cancer 12/17/2017   Overweight (BMI 25.0-29.9) 06/04/2017   Enlarged thyroid 12/10/2016   Nocturia 12/10/2016   Pelvic pain 09/16/2016   Uterine leiomyoma 09/16/2016   Situational mixed anxiety and depressive disorder 07/12/2016    Past Surgical History:  Procedure Laterality Date   COLPOSCOPY     LEEP      OB History      Gravida  4   Para  2   Term  2   Preterm      AB  2   Living  2      SAB      IAB      Ectopic      Multiple      Live Births  2            Home Medications    Prior to Admission medications   Medication Sig Start Date End Date Taking? Authorizing Provider  hydrochlorothiazide (HYDRODIURIL) 12.5 MG tablet Take 12.5 mg by mouth daily. 01/24/21  Yes [provider]  hydrochlorothiazide (MICROZIDE) 12.5 MG capsule Take by mouth. 03/07/21  Yes [provider]  hydrOXYzine (ATARAX) 25 MG tablet Take 25 mg by mouth 3 (three) times daily. 08/14/21  Yes [provider]  norethindrone (AYGESTIN) 5 MG tablet TAKE 1 TABLET(5 MG) BY MOUTH DAILY [id number]  Yes Copland, Alicia B, PA-C  WEGOVY 1 MG/0.5ML SOAJ Inject 1 mg into the skin once a week. 10/08/21  Yes [provider]  diazepam (VALIUM) 5 MG tablet Take by mouth. 04/06/21   [provider]  ferrous sulfate (FERROUSUL) 325 (65 FE) MG tablet Take 1 tablet (325 mg total) by mouth 2 (two) times daily. 10/21/19 02/09/21  [name], MD    Family History Family History  Problem Relation Age of Onset   Ovarian cancer Maternal Aunt 48   Hypertension Father  Other Mother        fibroids/leiomyoma of uterus   Breast cancer Cousin    Breast cancer Cousin     Social History Social History   Tobacco Use   Smoking status: Former   Smokeless tobacco: Never  Scientific laboratory technician Use: Never used  Substance Use Topics   Alcohol use: Yes    Comment: occ   Drug use: No     Allergies   Bupropion   Review of Systems Review of Systems  Constitutional:  Positive for fatigue. Negative for chills, diaphoresis and fever.  HENT:  Positive for congestion, rhinorrhea, sinus pressure and sore throat. Negative for ear discharge, ear pain and sinus pain.   Respiratory:  Positive for cough. Negative for shortness of breath and wheezing.   Cardiovascular:  Negative for chest pain.   Gastrointestinal:  Negative for abdominal pain, diarrhea, nausea and vomiting.  Musculoskeletal:  Negative for arthralgias and myalgias.  Skin:  Negative for rash.  Neurological:  Negative for weakness and headaches.  Hematological:  Negative for adenopathy.     Physical Exam Triage Vital Signs ED Triage Vitals  Enc Vitals Group     BP      Pulse      Resp      Temp      Temp src      SpO2      Weight      Height      Head Circumference      Peak Flow      Pain Score      Pain Loc      Pain Edu?      Excl. in [place]?    No data found.  Updated Vital Signs BP 121/84 (BP Location: Left Arm)   Pulse (!) 104   Temp 98.1 F (36.7 C) (Oral)   Ht 5\' 8"  (1.727 m)   Wt 180 lb (81.6 kg)   SpO2 94%   BMI 27.37 kg/m    Physical Exam Vitals and nursing note reviewed.  Constitutional:      General: She is not in acute distress.    Appearance: Normal appearance. She is not ill-appearing or toxic-appearing.  HENT:     Head: Normocephalic and atraumatic.     Right Ear: Tympanic membrane, ear canal and external ear normal.     Left Ear: Tympanic membrane, ear canal and external ear normal.     Nose: Congestion present.     Mouth/Throat:     Mouth: Mucous membranes are moist.     Pharynx: Oropharynx is clear. Posterior oropharyngeal erythema (mild) present.  Eyes:     General: No scleral icterus.       Right eye: No discharge.        Left eye: No discharge.     Conjunctiva/sclera: Conjunctivae normal.  Cardiovascular:     Rate and Rhythm: Regular rhythm. Tachycardia present.     Heart sounds: Normal heart sounds.  Pulmonary:     Effort: Pulmonary effort is normal. No respiratory distress.     Breath sounds: Normal breath sounds.  Musculoskeletal:     Cervical back: Neck supple.  Skin:    General: Skin is dry.  Neurological:     General: No focal deficit present.     Mental Status: She is alert. Mental status is at baseline.     Motor: No weakness.     Gait: Gait  normal.  Psychiatric:  Mood and Affect: Mood is anxious.        Speech: Speech is rapid and pressured.        Behavior: Behavior is agitated and aggressive.        Thought Content: Thought content normal.      UC Treatments / Results  Labs (all labs ordered are listed, but only abnormal results are displayed) Labs Reviewed - No data to display  EKG   Radiology No results found.  Procedures Procedures (including critical care time)  Medications Ordered in UC Medications - No data to display  Initial Impression / Assessment and Plan / UC Course  I have reviewed the triage vital signs and the nursing notes.  Pertinent labs & imaging results that were available during my care of the patient were reviewed by me and considered in my medical decision making (see chart for details).   47 y/o female presents for 4-5 day history of nasal congestion, cough productive of yellowish sputum, bilateral ear pressure/popping, sore throat, sinus pressure and fatigue. Denies fever, ear pain, chest pain, wheezing, SOB. Recent travel to and from [place]. Saw PCP 3 days ago and had negative strep, flu, COVID and RSV testing.  Today she is requesting azithromycin because she believes she has acute sinusitis.  She says "I know my body and I know this is what I need."  Pulse elevated at 104 bpm but patient is anxious appearing and speaking quickly.  Overall well appearing. No acute distress. On exam no evidence of an ear infection.  She does have nasal congestion and mild posterior pharyngeal erythema.  Chest clear to auscultation in four listening fields.  Heart regular rhythm.  I reviewed patient's note from her PCP on 04/02/2022.  She was advised she had an upper respiratory infection advised to take OTC meds which she has been taking.  Advised patient that her symptoms are still consistent with an acute upper respiratory infection explained to her that those symptoms generally do last for 1 to 2  weeks but should be improving soon.  Sore throat has started to improve so I expect her other symptoms will as well.  She becomes increasingly frustrated and demands azithromycin.  I explained to her that her presentation today is not consistent with a bacterial process and I do not believe that medication will be helpful to her.  I did offer to give her cough medication, nasal spray but she declines stating that that will not help and she wants only azithromycin.  She says "so even if someone requests a medication they can't get it?"  I explained to patient that she was here for an evaluation to see if that is something that she needed and at this time I do not feel her presentation is consistent with a bacterial process so therefore I do not believe azithromycin is indicated.  Again she declines any other prescriptions.  She asked for the nurse manager's contact information so I provided that to her on the discharge handout.  Patient was waiting in room while I was getting her discharge papers together.  Patient came out of her exam room and up to the nursing station.  I went to give her her paperwork but she would not reach out her hand and take it so I slid it up onto the counter and it slid off onto the floor.  I apologized, went over and picked the paperwork up off the floor, and tried to give the patient.  She backed away  and said she did not want to talk to me and did not want anything to do with me.  She refused to take the paperwork for me.  I gave the paperwork to the medical assistant and asked him to please give the paperwork to the patient for me.  He spoke with patient as I went into the provider office.  Patient continued to speak very loudly and demandingly. MA took patient into her exam to speak to her further. Security guard had to come and stand outside the door to monitor. She was given paperwork (AVS) with contact information from [employer].    Final Clinical Impressions(s) / UC Diagnoses    Final diagnoses:  Acute upper respiratory infection  Acute cough  Nasal congestion     Discharge Instructions      -Your symptoms are consistent with a viral upper respiratory infection at this time. Antibiotics are not indicated at this time. You are negative strep. You do not have an ear infection and no signs of bronchitis/pneumonia. Azithromycin will not be helpful. -As discussed your symptoms will have to run their course which usually takes 1-2 weeks. Continue with over the counter medications since you have declined prescriptions for nasal sprays and cough medicines today. -If you develop a fever, severe sore throat, worsening cough, breathing difficulty or are not better after 2 weeks please see PCP or return for re-evaluation. For any acute worsening of symptoms, please go to the ER. -If you would like to contact a manager regarding your visit today, you may contact: [email or web address] or call her at [phone]. I hope you feel better soon.     ED Prescriptions   None    PDMP not reviewed this encounter.   Danton Clap, PA-C 04/05/22 1326

## 2022-04-05 NOTE — ED Triage Notes (Signed)
Pt c/o possible sinus infection. Pt is having facial pressure and bilateral ear issues x6days  Pt was seen by her PCP on Tuesday and was tested for strep, covid, and flu and it was negative.  Pt has been taking sudafed, claritin, and flonase and it has not helped.

## 2022-04-19 ENCOUNTER — Encounter: Payer: Self-pay | Admitting: Anesthesiology

## 2022-04-25 ENCOUNTER — Telehealth: Payer: Self-pay

## 2022-04-25 NOTE — Telephone Encounter (Signed)
Donna Zuniga from [facility] states Colonoscopy pt for 4/25.  Says she wants to cancel.  will call [facility] when ready to reschedule.  Put in referral this is the second time she has cancel her procedure. Called and left a detail message for patient

## 2022-05-02 ENCOUNTER — Ambulatory Visit: Admit: 2022-05-02 | Payer: Federal, State, Local not specified - PPO | Admitting: Gastroenterology

## 2022-05-02 SURGERY — COLONOSCOPY WITH PROPOFOL
Anesthesia: Choice

## 2022-06-20 ENCOUNTER — Telehealth: Payer: Self-pay | Admitting: Obstetrics and Gynecology

## 2022-06-20 DIAGNOSIS — Z1211 Encounter for screening for malignant neoplasm of colon: Secondary | ICD-10-CM

## 2022-06-20 NOTE — Telephone Encounter (Signed)
Pls call her to clarify questions since I"m not in office tomorrow. Pls let her know I"ll call her back Mon unless urgent. Thx.

## 2022-06-20 NOTE — Telephone Encounter (Signed)
Patient called requesting a phone call back, she has a couple of questions.  Her return phone number is [phone].

## 2022-06-21 NOTE — Telephone Encounter (Signed)
Called pt, no answer, LVMTRC. 

## 2022-06-28 NOTE — Telephone Encounter (Signed)
Tried again, no answer, LVMTRC. 

## 2022-06-28 NOTE — Telephone Encounter (Signed)
The patient is returning missed call, please advise?

## 2022-06-29 NOTE — Telephone Encounter (Signed)
Cologuard order placed. Pt will get box mailed to her.

## 2022-07-01 NOTE — Telephone Encounter (Signed)
The colonoscopy is the best screening we have, but if she can't/won't do it, then the cologuard is a good 2nd best option. Thx.

## 2022-07-01 NOTE — Telephone Encounter (Signed)
Called pt, no answer, LVMTRC. 

## 2022-07-02 NOTE — Telephone Encounter (Signed)
Pt aware.

## 2022-07-25 ENCOUNTER — Ambulatory Visit: Payer: Federal, State, Local not specified - PPO | Attending: Family Medicine

## 2022-07-25 DIAGNOSIS — R42 Dizziness and giddiness: Secondary | ICD-10-CM | POA: Insufficient documentation

## 2022-07-25 NOTE — Therapy (Unsigned)
OUTPATIENT PHYSICAL THERAPY VESTIBULAR EVALUATION  Patient Name: Donna Zuniga MRN: [id number] DOB:1975/11/10, 47 y.o., female Today's Date: 07/28/2022  PCP: Dione Housekeeper, MD REFERRING PROVIDER: Dione Housekeeper, *   PT End of Session - 07/28/22 2136     Visit Number 1    Number of Visits 13    Date for PT Re-Evaluation 09/05/22    Authorization Type eval: 07/25/22    PT Start Time 1155    PT Stop Time 1245    PT Time Calculation (min) 50 min    Activity Tolerance Patient tolerated treatment well    Behavior During Therapy [facility] for tasks assessed/performed            Past Medical History:  Diagnosis Date   Anxiety    Cervical dysplasia    Depression    Family history of ovarian cancer    doesn't meet BCBS Fed genetic testing guidelines.   Heart palpitations    Dr. Allena Katz at [facility]   Leiomyoma 2014   Pre-diabetes    Vitamin D deficiency 11/2015   Past Surgical History:  Procedure Laterality Date   COLPOSCOPY     LEEP     Patient Active Problem List   Diagnosis Date Noted   Iron deficiency anemia due to chronic blood loss 10/20/2020   COVID-19 12/28/2019   Hypertensive disorder 03/31/2019   Exposure to severe acute respiratory syndrome coronavirus 2 (SARS-CoV-2) 03/31/2019   Family history of ovarian cancer 12/17/2017   Overweight (BMI 25.0-29.9) 06/04/2017   Enlarged thyroid 12/10/2016   Nocturia 12/10/2016   Pelvic pain 09/16/2016   Uterine leiomyoma 09/16/2016   Situational mixed anxiety and depressive disorder 07/12/2016    PCP: Dione Housekeeper MD  REFERRING PROVIDER: Dione Housekeeper MD  REFERRING DIAG: Benign paroxysmal positional vertigo, unspecified laterality   RATIONALE FOR EVALUATION AND TREATMENT: Rehabilitation  THERAPY DIAG: Dizziness and giddiness R42  ONSET DATE: 07/22/22  FOLLOW-UP APPT SCHEDULED WITH REFERRING PROVIDER: No    SUBJECTIVE:   Chief Complaint: Vertigo  Pertinent History The patient  experienced a recurrence of her positional vertigo on the morning of 07/22/2022. She saw her PCP, Dr. Zada Finders, and was referred to physical therapy and prescribed meclizine. Pt reports that meclizine doesn't help her symptoms but makes her tired. She last took it last night. Pt was previously diagnosed with vertigo in early summer 2023, which was subsequently found to be BPPV by an ENT specialist in [place], [place]. She was seen at this clinic for vestibular physical therapy with complete resolution of her symptoms. However her condition has returned and has intensified. Her nausea is worse and her dizziness is triggered by movement such as getting out of bed in the morning, bending over or turning her head slightly. Last year, she consulted a neurologist, underwent a brain scan, and consulted with an ENT specialist.   History from 05/23/21: Pt reports 2-3 months of dizziness. She had one similar episode years ago but symptoms resolved. She states that she gets symptoms if she rolls over, lays flat, bends forward, or turns her head quickly (such as when driving). She states that after the vertigo resolves she feels slightly foggy and it takes multiple minutes for her to re-engage in whatever activity she was performing previously. She also states that her body feels heavy and she doesn't have much energy. She has been the ED for these symptoms and has also seen ENT. Per note from ENT that pt brought with her they treated her with  the Epley maneuver for R posterior canal BPPV. She has had a brain MRI which was normal. She has an appointment to see neurology tomorrow. Pt is currently out of work on FMLA due to her symptoms and is eager to return to work. She works with [employer] and notes symptoms with turning her head back and forth to switch between screens. She notices that if she is walking in the hallway next to someone she feels dizzy especially if she has to turn her head to talk. Symptoms have  worsened her depression due to the significant life changes they have caused. She has been unable to workout because if she gets on the treadmill it makes her feel very dizzy. Pt has tried taking meclizine but it makes her very drowsy so she is unable to take. She also gets nausea and has an anti-emetic but hasn't had to use.  Description of dizziness: vertigo Frequency: Daily depending on whether she triggers symptoms with movement Duration: "a couple minutes" Symptom nature: intermittent Progression of symptoms since onset: worse History of similar episodes: Yes  Provocative Factors: Looking down, bending over, turning head quickly Easing Factors: waiting for symptoms to pass and breathing  Auditory complaints (tinnitus, pain, drainage, hearing loss, aural fullness): No, no hearing difficulties Vision changes (diplopia, visual field loss, recent changes, recent eye exam): No Chest pain/palpitations: Yes, pt reports some palpitations but no chest pain. She is having lightheadedness. History of head injury/concussion: No Stress/anxiety: Yes, pt reports significant personal and work stress Headache/migraine: History of headaches but none recently  Has patient fallen in last 6 months? No, Pertinent pain: None Dominant hand: right Imaging: Yes, brain MRI 05/13/21: No acute or significant abnormality. Prior level of function: Independent Occupational demands: Works at [employer] in [place]; Hobbies: Pt likes to exercise  Red Flags: (dysarthria, dysphagia, drop attacks, bowel and bladder changes, recent weight loss/gain) Review of systems negative for red flags.   PRECAUTIONS: None  WEIGHT BEARING RESTRICTIONS No  PATIENT GOALS Pt wants to resolve her symptoms in order to return to work symptom-free    OBJECTIVE EXAMINATION  POSTURE: No gross deficits contributing to symptoms  NEUROLOGICAL SCREEN: Deferred  CRANIAL NERVES Deferred  SOMATOSENSORY Deferred    COORDINATION Deferred   RANGE OF MOTION Cervical AROM grossly WNL without pain;  MANUAL MUSCLE TESTING BUE/BLE strength WNL without focal deficits  TRANSFERS/GAIT Independent for transfers and ambulation without assistive device   PATIENT SURVEYS FOTO: 39, predicted improvement to 60 DHI: To be completed at next visit  POSTURAL CONTROL TESTS Clinical Test of Sensory Interaction for Balance (CTSIB): Deferred  OCULOMOTOR / VESTIBULAR TESTING Oculomotor Exam- Room Light: Deferred   Oculomotor Exam- Fixation Suppressed: DeferredBPPV TESTS:  Symptoms Duration Intensity Nystagmus  L Dix-Hallpike None Brief Mild A few very faint L torsional beats  R Dix-Hallpike None   None  L Head Roll Mild dizziness Brief Mild A few very faint L torsional/L horizontal? beats  R Head Roll None   None  L Sidelying Test None   None  R Sidelying Test Vertigo 10s Mild Vigorous pure R horizontal (geotrophic) nystagmus  (blank = not tested)   FUNCTIONAL OUTCOME MEASURES Deferred   TODAY'S TREATMENT    Canalith Repositioning Treatment Performed Gufoni manuver for presumed R horizontal canalithiasis BPPV (sidelying on L side) with 30s hold in L sidelying followed by 1 minute nose down. After Gufoni retested pt in R sidelying position and she continues to present with vigorous pure R horizontal (geotrophic)  nystagmus with significant vertigo. Proceeded to complete the [employer] with negative retesting afterwards. Afterward performed Li maneuver for R side.    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 female who was seen today for  physical therapy evaluation and treatment of BPPV. Testing is positive for R horizontal canalithiasis BPPV and possible L posterior canal. Pt treated for R horizontal canal with CRT today. Issued HEP for L posterior canal. Will retest and treat at next visit.    OBJECTIVE IMPAIRMENTS: dizziness.   ACTIVITY LIMITATIONS: carrying, lifting, bending, transfers, and bed mobility  PARTICIPATION LIMITATIONS: meal prep, cleaning, laundry, driving, community activity, and occupation  PERSONAL FACTORS: Behavior pattern, Past/current experiences, Time since onset of injury/illness/exacerbation, and 3+ comorbidities: anxiety, depression, heart palpitations, and pre-diabetes are also affecting patient's functional outcome.   REHAB POTENTIAL: Excellent  CLINICAL DECISION MAKING: Unstable/unpredictable  EVALUATION COMPLEXITY: High   GOALS: Goals reviewed with patient? No  SHORT TERM GOALS: Target date: 08/15/22  Pt will be independent with HEP for dizziness in order to decrease symptoms, improve balance,decrease fall risk, and improve function at home. Baseline: Goal status: INITIAL   LONG TERM GOALS: Target date: 09/05/2022   Pt will increase FOTO to at least 60 to demonstrate significant improvement in function at home related to dizziness.  Baseline: 07/25/22: 39 Goal status: INITIAL  2.  Pt will decrease DHI score by at least 18 points in order to demonstrate clinically significant reduction in disability related to dizziness.  Baseline: To be completed at first follow-up visit Goal status: INITIAL  3.  Pt will report 100% resolution of her vertigo in order to demonstrate clinically significant improvement in symptoms and facilitate return to work and return to exercise.      Baseline:  Goal status: INITIAL   PLAN:  PT FREQUENCY: 1-2x/week  PT DURATION: 6 weeks  PLANNED INTERVENTIONS: Therapeutic exercises, Therapeutic activity, Neuromuscular re-education, Balance training, Gait  training, Patient/Family education, Joint manipulation, Joint mobilization, Canalith repositioning, Aquatic Therapy, Dry Needling, Cognitive remediation, Electrical stimulation, Spinal manipulation, Spinal mobilization, Cryotherapy, Moist heat, Traction, Ultrasound, Ionotophoresis 4mg /ml Dexamethasone, and Manual therapy  PLAN FOR NEXT SESSION: Pt to complete DHI, BPPV testing and treatment as needed (R horizontal canal, possible L posterior canal), additional oculomotor and vestibular testing as needed/appropriate   Sharalyn Ink Solene Hereford PT, DPT, GCS  Ayako Tapanes 07/28/2022, 9:54 PM

## 2022-08-02 ENCOUNTER — Ambulatory Visit: Payer: Federal, State, Local not specified - PPO

## 2022-08-06 ENCOUNTER — Ambulatory Visit
Admission: EM | Admit: 2022-08-06 | Discharge: 2022-08-06 | Disposition: A | Discharge status: Home / Self Care | Payer: Federal, State, Local not specified - PPO

## 2022-08-06 DIAGNOSIS — H8110 Benign paroxysmal vertigo, unspecified ear: Secondary | ICD-10-CM | POA: Diagnosis not present

## 2022-08-06 DIAGNOSIS — H66002 Acute suppurative otitis media without spontaneous rupture of ear drum, left ear: Secondary | ICD-10-CM | POA: Diagnosis not present

## 2022-08-06 MED ORDER — AMOXICILLIN-POT CLAVULANATE 875-125 MG PO TABS: 1.0000 | ORAL_TABLET | Freq: Two times a day (BID) | ORAL | 0 refills | Status: AC

## 2022-08-06 MED ORDER — ONDANSETRON 4 MG PO TBDP
4.0000 mg | ORAL_TABLET | Freq: Once | ORAL | Status: AC
  Administered 2022-08-06: 4 mg via ORAL

## 2022-08-06 MED ORDER — ONDANSETRON HCL 4 MG PO TABS: 4.0000 mg | ORAL_TABLET | Freq: Three times a day (TID) | ORAL | 0 refills | Status: AC | PRN

## 2022-08-06 NOTE — ED Triage Notes (Addendum)
Pt presents to UC c/o vertigo, pt reports hx of BPP, pt states she was given meclizine but just making her sleepy. Pt states the last time she had the issue was last July. Pt also reports nausea and loss of appetite.

## 2022-08-06 NOTE — Discharge Instructions (Signed)
Take flonase as label directed. Take allergy med of choice(claritin,allegra,zyrtec), take antibiotic as directed(Augmentin): all antibiotics can cause stomach upset or photosensitivity. Take meclizine as directed for BPV, follow up with ENT if this worsens. May take zofran for nausea as directed.   If you develop chest pain,shortness of breath or worsening symptoms go to ER for further evaluation.

## 2022-08-06 NOTE — ED Provider Notes (Signed)
[facility]    CSN: [id number] Arrival date & time: 08/06/22  1853      History   Chief Complaint Chief Complaint  Patient presents with   Dizziness   Nausea    HPI Donna Zuniga is a 47 y.o. female.   47 year old female, Donna Zuniga, presents to urgent care for evaluation of BPV, patient states she had the same thing last year and has been seen by ENT however it is worse now causing nausea and loss of appetite.  States she has a prescription for meclizine but it just makes her sleepy.  The history is provided by the patient. No language interpreter was used.    Past Medical History:  Diagnosis Date   Anxiety    Cervical dysplasia    Depression    Family history of ovarian cancer    doesn't meet BCBS Fed genetic testing guidelines.   Heart palpitations    Dr. Allena Katz at [facility]   Leiomyoma 2014   Pre-diabetes    Vitamin D deficiency 11/2015    Patient Active Problem List   Diagnosis Date Noted   Acute suppurative otitis media of left ear without spontaneous rupture of tympanic membrane 08/06/2022   Benign paroxysmal positional vertigo 08/06/2022   Iron deficiency anemia due to chronic blood loss 10/20/2020   COVID-19 12/28/2019   Hypertensive disorder 03/31/2019   Exposure to severe acute respiratory syndrome coronavirus 2 (SARS-CoV-2) 03/31/2019   Family history of ovarian cancer 12/17/2017   Overweight (BMI 25.0-29.9) 06/04/2017   Enlarged thyroid 12/10/2016   Nocturia 12/10/2016   Pelvic pain 09/16/2016   Uterine leiomyoma 09/16/2016   Situational mixed anxiety and depressive disorder 07/12/2016    Past Surgical History:  Procedure Laterality Date   COLPOSCOPY     LEEP      OB History     Gravida  4   Para  2   Term  2   Preterm      AB  2   Living  2      SAB      IAB      Ectopic      Multiple      Live Births  2            Home Medications    Prior to Admission medications   Medication  Sig Start Date End Date Taking? Authorizing Provider  amoxicillin-clavulanate (AUGMENTIN) 875-125 MG tablet Take 1 tablet by mouth every 12 (twelve) hours for 10 days. 08/06/22 08/16/22 Yes Jossilyn Benda, Para March, NP  meclizine (ANTIVERT) 12.5 MG tablet Take by mouth. 07/24/22 08/23/22 Yes [provider]  ondansetron (ZOFRAN) 4 MG tablet Take 1 tablet (4 mg total) by mouth every 8 (eight) hours as needed for up to 3 days for nausea or vomiting. 08/06/22 08/09/22 Yes Ayonna Speranza, Para March, NP  diazepam (VALIUM) 5 MG tablet Take by mouth. 04/06/21   [provider]  hydrochlorothiazide (HYDRODIURIL) 12.5 MG tablet Take 12.5 mg by mouth daily. 01/24/21   [provider]  hydrochlorothiazide (MICROZIDE) 12.5 MG capsule Take by mouth. 03/07/21   [provider]  hydrOXYzine (ATARAX) 25 MG tablet Take 25 mg by mouth 3 (three) times daily. 08/14/21   [provider]  norethindrone (AYGESTIN) 5 MG tablet TAKE 1 TABLET(5 MG) BY MOUTH DAILY 10/23/21   Copland, Alicia B, PA-C  WEGOVY 1 MG/0.5ML SOAJ Inject 1 mg into the skin once a week. 10/08/21   [provider]  ferrous sulfate (FERROUSUL) 325 (  65 FE) MG tablet Take 1 tablet (325 mg total) by mouth 2 (two) times daily. 10/21/19 02/09/21  Vena Austria, MD    Family History Family History  Problem Relation Age of Onset   Ovarian cancer Maternal Aunt 97   Hypertension Father    Other Mother        fibroids/leiomyoma of uterus   Breast cancer Cousin    Breast cancer Cousin     Social History Social History   Tobacco Use   Smoking status: Former   Smokeless tobacco: Never  Advertising account planner   Vaping status: Never Used  Substance Use Topics   Alcohol use: Yes    Comment: occ   Drug use: No     Allergies   Bupropion   Review of Systems Review of Systems  Constitutional:  Negative for fever.  HENT:  Positive for congestion and ear pain.   Gastrointestinal:  Positive for nausea.  Neurological:  Positive for  dizziness.  All other systems reviewed and are negative.    Physical Exam Triage Vital Signs ED Triage Vitals  Encounter Vitals Group     BP 08/06/22 1922 (!) 140/83     Systolic BP Percentile --      Diastolic BP Percentile --      Pulse Rate 08/06/22 1922 81     Resp --      Temp 08/06/22 1925 99.2 F (37.3 C)     Temp Source 08/06/22 1922 Oral     SpO2 08/06/22 1922 99 %     Weight --      Height --      Head Circumference --      Peak Flow --      Pain Score --      Pain Loc --      Pain Education --      Exclude from Growth Chart --    No data found.  Updated Vital Signs BP (!) 140/83 (BP Location: Left Arm)   Pulse 81   Temp 99.2 F (37.3 C)   SpO2 99%   Visual Acuity Right Eye Distance:   Left Eye Distance:   Bilateral Distance:    Right Eye Near:   Left Eye Near:    Bilateral Near:     Physical Exam Vitals and nursing note reviewed.  Constitutional:      General: She is not in acute distress.    Appearance: She is well-developed.  HENT:     Head: Normocephalic.     Right Ear: Tympanic membrane is retracted.     Left Ear: Tympanic membrane is erythematous and bulging.     Nose: Congestion present.     Right Sinus: No maxillary sinus tenderness or frontal sinus tenderness.     Left Sinus: No maxillary sinus tenderness or frontal sinus tenderness.     Mouth/Throat:     Lips: Pink.     Mouth: Mucous membranes are moist.     Pharynx: Oropharynx is clear.  Eyes:     General: Lids are normal.     Extraocular Movements: Extraocular movements intact.     Right eye: No nystagmus.     Left eye: No nystagmus.     Conjunctiva/sclera: Conjunctivae normal.     Pupils: Pupils are equal, round, and reactive to light.  Neck:     Trachea: No tracheal deviation.  Cardiovascular:     Rate and Rhythm: Normal rate and regular rhythm.     Pulses: Normal pulses.  Heart sounds: Normal heart sounds. No murmur heard. Pulmonary:     Effort: Pulmonary effort is  normal.     Breath sounds: Normal breath sounds and air entry.  Abdominal:     General: Bowel sounds are normal.     Palpations: Abdomen is soft.     Tenderness: There is no abdominal tenderness.  Musculoskeletal:        General: Normal range of motion.     Cervical back: Normal range of motion and neck supple.  Lymphadenopathy:     Cervical: No cervical adenopathy.  Skin:    General: Skin is warm and dry.     Findings: No rash.  Neurological:     General: No focal deficit present.     Mental Status: She is alert and oriented to person, place, and time.     GCS: GCS eye subscore is 4. GCS verbal subscore is 5. GCS motor subscore is 6.     Cranial Nerves: No cranial nerve deficit.     Sensory: No sensory deficit.  Psychiatric:        Attention and Perception: Attention normal.        Mood and Affect: Mood normal.        Speech: Speech normal.        Behavior: Behavior normal. Behavior is cooperative.      UC Treatments / Results  Labs (all labs ordered are listed, but only abnormal results are displayed) Labs Reviewed - No data to display  EKG   Radiology No results found.  Procedures Procedures (including critical care time)  Medications Ordered in UC Medications  ondansetron (ZOFRAN-ODT) disintegrating tablet 4 mg (4 mg Oral Given 08/06/22 1939)    Initial Impression / Assessment and Plan / UC Course  I have reviewed the triage vital signs and the nursing notes.  Pertinent labs & imaging results that were available during my care of the patient were reviewed by me and considered in my medical decision making (see chart for details).     Ddx: Left AOM,BPV,Allergies Final Clinical Impressions(s) / UC Diagnoses   Final diagnoses:  Acute suppurative otitis media of left ear without spontaneous rupture of tympanic membrane, recurrence not specified  Benign paroxysmal positional vertigo, unspecified laterality     Discharge Instructions      Take flonase  as label directed. Take allergy med of choice(claritin,allegra,zyrtec), take antibiotic as directed(Augmentin): all antibiotics can cause stomach upset or photosensitivity. Take meclizine as directed for BPV, follow up with ENT if this worsens. May take zofran for nausea as directed.   If you develop chest pain,shortness of breath or worsening symptoms go to ER for further evaluation.     ED Prescriptions     Medication Sig Dispense Auth. Provider   ondansetron (ZOFRAN) 4 MG tablet Take 1 tablet (4 mg total) by mouth every 8 (eight) hours as needed for up to 3 days for nausea or vomiting. 9 tablet Ebonique Hallstrom, NP   amoxicillin-clavulanate (AUGMENTIN) 875-125 MG tablet Take 1 tablet by mouth every 12 (twelve) hours for 10 days. 20 tablet Macy Polio, Para March, NP      PDMP not reviewed this encounter.   Clancy Gourd, NP 08/06/22 2045

## 2022-08-30 ENCOUNTER — Ambulatory Visit: Payer: Self-pay

## 2022-09-24 ENCOUNTER — Other Ambulatory Visit: Payer: Self-pay | Admitting: *Deleted

## 2022-09-24 ENCOUNTER — Other Ambulatory Visit: Payer: Self-pay | Admitting: Family Medicine

## 2022-09-24 ENCOUNTER — Inpatient Hospital Stay
Admission: RE | Admit: 2022-09-24 | Discharge: 2022-09-24 | Disposition: A | Discharge status: Home / Self Care | Payer: Self-pay | Source: Ambulatory Visit | Attending: Obstetrics and Gynecology | Admitting: Obstetrics and Gynecology

## 2022-09-24 DIAGNOSIS — Z1231 Encounter for screening mammogram for malignant neoplasm of breast: Secondary | ICD-10-CM

## 2022-10-21 ENCOUNTER — Ambulatory Visit
Admission: EM | Admit: 2022-10-21 | Discharge: 2022-10-21 | Disposition: A | Discharge status: Home / Self Care | Payer: Federal, State, Local not specified - PPO | Attending: Internal Medicine | Admitting: Internal Medicine

## 2022-10-21 DIAGNOSIS — H6993 Unspecified Eustachian tube disorder, bilateral: Secondary | ICD-10-CM

## 2022-10-21 MED ORDER — LORATADINE-D 24HR 10-240 MG PO TB24: 1.0000 | ORAL_TABLET | Freq: Every day | ORAL | 0 refills | Status: DC

## 2022-10-21 MED ORDER — PREDNISONE 10 MG PO TABS
20.0000 mg | ORAL_TABLET | Freq: Every day | ORAL | 0 refills | Status: AC
Start: 2022-10-21 — End: 2022-10-26

## 2022-10-21 NOTE — Discharge Instructions (Signed)
Start Claritin-D daily for the next 5 to 7 days.  Continue Flonase.  If you not have improvement of the Claritin-D after 5 to 7 days you may start prednisone daily for 5 days.  Take this in the morning.  I would advise you follow-up with your ENT if your symptoms do not improve.  Please go to the ER if you develop any worsening symptoms.  I hope you feel better soon!

## 2022-10-21 NOTE — ED Triage Notes (Addendum)
Bilateral ear pain. More on the left, using Flonase and zyrtec. Using claritin also. Lost hearing in left ear.

## 2022-10-21 NOTE — ED Provider Notes (Signed)
[facility]    CSN: [id number] Arrival date & time: 10/21/22  1552      History   Chief Complaint No chief complaint on file.   HPI Donna Zuniga is a 47 y.o. female presents for ear pain/fullness.  Patient reports several days of bilateral ear pain/fullness left greater than right muffled hearing.  Does endorse some congestion as well.  No fevers or chills.  She has been taking Flonase as well as cetirizine with minimal improvement.  She has seen ENT in the past for similar symptoms.  No other concerns at this time.  HPI  Past Medical History:  Diagnosis Date   Anxiety    Cervical dysplasia    Depression    Family history of ovarian cancer    doesn't meet BCBS Fed genetic testing guidelines.   Heart palpitations    Dr. Allena Katz at [facility]   Leiomyoma 2014   Pre-diabetes    Vitamin D deficiency 11/2015    Patient Active Problem List   Diagnosis Date Noted   Acute suppurative otitis media of left ear without spontaneous rupture of tympanic membrane 08/06/2022   Benign paroxysmal positional vertigo 08/06/2022   Iron deficiency anemia due to chronic blood loss 10/20/2020   COVID-19 12/28/2019   Hypertensive disorder 03/31/2019   Exposure to severe acute respiratory syndrome coronavirus 2 (SARS-CoV-2) 03/31/2019   Family history of ovarian cancer 12/17/2017   Overweight (BMI 25.0-29.9) 06/04/2017   Enlarged thyroid 12/10/2016   Nocturia 12/10/2016   Pelvic pain 09/16/2016   Uterine leiomyoma 09/16/2016   Situational mixed anxiety and depressive disorder 07/12/2016    Past Surgical History:  Procedure Laterality Date   COLPOSCOPY     LEEP      OB History     Gravida  4   Para  2   Term  2   Preterm      AB  2   Living  2      SAB      IAB      Ectopic      Multiple      Live Births  2            Home Medications    Prior to Admission medications   Medication Sig Start Date End Date Taking? Authorizing Provider   hydrochlorothiazide (MICROZIDE) 12.5 MG capsule Take by mouth. 03/07/21  Yes [provider]  loratadine-pseudoephedrine (LORATADINE-D 24HR) 10-240 MG 24 hr tablet Take 1 tablet by mouth daily. 10/21/22  Yes Radford Pax, NP  norethindrone (AYGESTIN) 5 MG tablet TAKE 1 TABLET(5 MG) BY MOUTH DAILY 10/23/21  Yes Copland, Alicia B, PA-C  predniSONE (DELTASONE) 10 MG tablet Take 2 tablets (20 mg total) by mouth daily for 5 days. 10/21/22 10/26/22 Yes Radford Pax, NP  WEGOVY 1 MG/0.5ML SOAJ Inject 1 mg into the skin once a week. 10/08/21  Yes [provider]  diazepam (VALIUM) 5 MG tablet Take by mouth. 04/06/21   [provider]  hydrochlorothiazide (HYDRODIURIL) 12.5 MG tablet Take 12.5 mg by mouth daily. 01/24/21   [provider]  ferrous sulfate (FERROUSUL) 325 (65 FE) MG tablet Take 1 tablet (325 mg total) by mouth 2 (two) times daily. 10/21/19 02/09/21  Vena Austria, MD    Family History Family History  Problem Relation Age of Onset   Ovarian cancer Maternal Aunt 54   Hypertension Father    Other Mother        fibroids/leiomyoma of uterus  Breast cancer Cousin    Breast cancer Cousin     Social History Social History   Tobacco Use   Smoking status: Former   Smokeless tobacco: Never  Advertising account planner   Vaping status: Never Used  Substance Use Topics   Alcohol use: Yes    Comment: occ   Drug use: No     Allergies   Bupropion   Review of Systems Review of Systems  HENT:  Positive for congestion.        Ear pain/fullness     Physical Exam Triage Vital Signs ED Triage Vitals  Encounter Vitals Group     BP 10/21/22 1608 127/87     Systolic BP Percentile --      Diastolic BP Percentile --      Pulse Rate 10/21/22 1608 78     Resp 10/21/22 1608 17     Temp 10/21/22 1608 98.5 F (36.9 C)     Temp Source 10/21/22 1608 Oral     SpO2 10/21/22 1608 98 %     Weight --      Height --      Head Circumference --      Peak Flow --       Pain Score 10/21/22 1606 10     Pain Loc --      Pain Education --      Exclude from Growth Chart --    No data found.  Updated Vital Signs BP 127/87 (BP Location: Right Arm)   Pulse 78   Temp 98.5 F (36.9 C) (Oral)   Resp 17   SpO2 98%   Visual Acuity Right Eye Distance:   Left Eye Distance:   Bilateral Distance:    Right Eye Near:   Left Eye Near:    Bilateral Near:     Physical Exam Vitals and nursing note reviewed.  Constitutional:      General: She is not in acute distress.    Appearance: Normal appearance. She is not ill-appearing.  HENT:     Head: Normocephalic and atraumatic.     Right Ear: No drainage or swelling. A middle ear effusion is present. Tympanic membrane is not erythematous.     Left Ear: No drainage or swelling. A middle ear effusion is present. Tympanic membrane is not erythematous.  Eyes:     Pupils: Pupils are equal, round, and reactive to light.  Cardiovascular:     Rate and Rhythm: Normal rate.  Pulmonary:     Effort: Pulmonary effort is normal.  Skin:    General: Skin is warm and dry.  Neurological:     General: No focal deficit present.     Mental Status: She is alert and oriented to person, place, and time.  Psychiatric:        Mood and Affect: Mood normal.        Behavior: Behavior normal.      UC Treatments / Results  Labs (all labs ordered are listed, but only abnormal results are displayed) Labs Reviewed - No data to display  EKG   Radiology No results found.  Procedures Procedures (including critical care time)  Medications Ordered in UC Medications - No data to display  Initial Impression / Assessment and Plan / UC Course  I have reviewed the triage vital signs and the nursing notes.  Pertinent labs & imaging results that were available during my care of the patient were reviewed by me and considered in my medical decision making (  see chart for details).     Reviewed exam and sx with patient. No red flags.  Discussed ETD. Continue Flonase. Trial claritin D. If no improvement after 5-7 days on the Claritin she can start prednisone daily for 5 days.  Will do low-dose that she states it causes her to have insomnia.  Advised follow-up with ENT if her symptoms do not improve strict ER precautions reviewed and patient verbalized understanding. Final Clinical Impressions(s) / UC Diagnoses   Final diagnoses:  Dysfunction of both eustachian tubes     Discharge Instructions      Start Claritin-D daily for the next 5 to 7 days.  Continue Flonase.  If you not have improvement of the Claritin-D after 5 to 7 days you may start prednisone daily for 5 days.  Take this in the morning.  I would advise you follow-up with your ENT if your symptoms do not improve.  Please go to the ER if you develop any worsening symptoms.  I hope you feel better soon!    ED Prescriptions     Medication Sig Dispense Auth. Provider   loratadine-pseudoephedrine (LORATADINE-D 24HR) 10-240 MG 24 hr tablet Take 1 tablet by mouth daily. 30 tablet Radford Pax, NP   predniSONE (DELTASONE) 10 MG tablet Take 2 tablets (20 mg total) by mouth daily for 5 days. 10 tablet Radford Pax, NP      PDMP not reviewed this encounter.   Radford Pax, NP 10/21/22 [phone]

## 2022-10-23 ENCOUNTER — Other Ambulatory Visit: Payer: Self-pay | Admitting: Obstetrics and Gynecology

## 2022-10-23 DIAGNOSIS — D251 Intramural leiomyoma of uterus: Secondary | ICD-10-CM

## 2022-10-27 NOTE — Progress Notes (Unsigned)
PCP:  Dione Housekeeper, MD   No chief complaint on file.    HPI:      [name] is a 47 y.o. No obstetric history on file. who LMP was No LMP recorded. Patient has had an ablation., presents today for her annual examination.  Her menses are absent on aygestin. Has BTB if misses pills. Was having AUB and diagnosed with 3 leio 4/21. Now s/p [place] due to [facility], was on depo lupron, changed to aygestin daily. Leio decreased in size on 2 GYN u/s. Dr. Bonney Aid kept pt on aygestin after 10/22 appt.  Hx of IDA due to AUB, no longer taking Fe supp; normal CBC 4/23. Was on [facility] prior to AUB and did well. Hx of HTN.    Sex activity: single partner, contraception - on aygestin daily. No pain/bleeding. Last Pap: 10/20/20 Results were: neg/neg HPV DNA. Likes yearly paps but not indicated.  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 There is a FH of breast cancer in 2 maternal cousins. There is a FH of ovarian cancer in her mat aunt, genetic testing hasn't been done. Pt has declined genetic testing in past. The patient does do self-breast exams. She is interested in getting breast reduction due to heaviness of breasts; has lost wt and is exercising. Still has back pain.   Tobacco use: Quit tobacco several yrs ago Alcohol use: social No drug use.  Exercise: very active; doing diet/exercise changes for wt loss. Wants to get off BP meds.   Colonoscopy: never; had appt with Dr. Servando Snare, canceled 4/23 due to emergency. Needs new ref per pt  ?????  She does get adequate calcium but not Vitamin D in her diet.  She has a hx of enlarged thyroid, followed by PCP. Other labs with PCP, too 3/23    Past Medical History:  Diagnosis Date   Anxiety    Cervical dysplasia    Depression    Family history of ovarian cancer    doesn't meet BCBS Fed genetic testing guidelines.   Heart  palpitations    Dr. Allena Katz at [facility]   Leiomyoma 2014   Pre-diabetes    Vitamin D deficiency 11/2015    Past Surgical History:  Procedure Laterality Date   COLPOSCOPY     LEEP      Family History  Problem Relation Age of Onset   Ovarian cancer Maternal Aunt 50   Hypertension Father    Other Mother        fibroids/leiomyoma of uterus   Breast cancer Cousin    Breast cancer Cousin     Social History   Socioeconomic History   Marital status: Married    Spouse name: Not on file   Number of children: Not on file   Years of education: Not on file   Highest education level: Not on file  Occupational History   Not on file  Tobacco Use   Smoking status: Former   Smokeless tobacco: Never  Vaping Use   Vaping status: Never Used  Substance and Sexual Activity   Alcohol use: Yes    Comment: occ   Drug use: No   Sexual activity: Yes    Birth control/protection: Pill  Other Topics Concern   Not on file  Social History Narrative   Not on file   Social Determinants of Health  Financial Resource Strain: Not on file  Food Insecurity: Not on file  Transportation Needs: Not on file  Physical Activity: Not on file  Stress: Not on file  Social Connections: Not on file  Intimate Partner Violence: Not on file    No outpatient medications have been marked as taking for the 10/28/22 encounter (Appointment) with Remer Couse, Ilona Sorrel, PA-C.     ROS:  Review of Systems  Constitutional:  Negative for fatigue, fever and unexpected weight change.  Respiratory:  Negative for cough, shortness of breath and wheezing.   Cardiovascular:  Negative for chest pain, palpitations and leg swelling.  Gastrointestinal:  Negative for blood in stool, constipation, diarrhea, nausea and vomiting.  Endocrine: Negative for cold intolerance, heat intolerance and polyuria.  Genitourinary:  Negative for dyspareunia, dysuria, flank pain, frequency, genital sores, hematuria, menstrual problem, pelvic pain,  urgency, vaginal bleeding, vaginal discharge and vaginal pain.  Musculoskeletal:  Negative for back pain, joint swelling and myalgias.  Skin:  Negative for rash.  Neurological:  Negative for dizziness, syncope, light-headedness, numbness and headaches.  Hematological:  Negative for adenopathy.  Psychiatric/Behavioral:  Negative for agitation, confusion, dysphoric mood, sleep disturbance and suicidal ideas. The patient is not nervous/anxious.      Objective: There were no vitals taken for this visit. Pt got in argument before appt. Normal BP at 9/20 appt and at Urgent care 12/20.   Physical Exam Constitutional:      Appearance: She is well-developed.  Genitourinary:     Vulva normal.     Right Labia: No rash, tenderness or lesions.    Left Labia: No tenderness, lesions or rash.    No vaginal discharge, erythema or tenderness.      Right Adnexa: not tender and no mass present.    Left Adnexa: not tender and no mass present.    No cervical motion tenderness, friability or polyp.     Uterus is enlarged.     Uterus is not tender.  Breasts:    Right: No mass, nipple discharge, skin change or tenderness.     Left: No mass, nipple discharge, skin change or tenderness.  Neck:     Thyroid: Thyromegaly present.  Cardiovascular:     Rate and Rhythm: Normal rate and regular rhythm.     Heart sounds: Normal heart sounds. No murmur heard. Pulmonary:     Effort: Pulmonary effort is normal.     Breath sounds: Normal breath sounds.  Abdominal:     Palpations: Abdomen is soft.     Tenderness: There is no abdominal tenderness. There is no guarding or rebound.  Musculoskeletal:        General: Normal range of motion.     Cervical back: Normal range of motion.  Lymphadenopathy:     Cervical: No cervical adenopathy.  Neurological:     General: No focal deficit present.     Mental Status: She is alert and oriented to person, place, and time.     Cranial Nerves: No cranial nerve deficit.   Skin:    General: Skin is warm and dry.  Psychiatric:        Mood and Affect: Mood normal.        Behavior: Behavior normal.        Thought Content: Thought content normal.        Judgment: Judgment normal.  Vitals reviewed.     Assessment/Plan:  Encounter for annual routine gynecological examination  Encounter for screening mammogram for malignant neoplasm of breast;  pt has mammo appt   Intramural and subserous leiomyoma of uterus - Plan: norethindrone (AYGESTIN) 5 MG tablet; Rx RF. Will cont for now. Discussed pros/cons. Has BTB if misses pills.   Family history of ovarian cancer--MyRisk testing discussed, pt declines. F/u prn.   Screening for colon cancer - Plan: Ambulatory referral to Gastroenterology; ref back to [facility]  Large breasts - Plan: Ambulatory referral to Plastic Surgery; refer for breast reduction.   No orders of the defined types were placed in this encounter.     GYN counsel breast self exam, mammography screening, adequate intake of calcium and vitamin D, diet and exercise     F/U  No follow-ups on file.  Aneka Fagerstrom B. Kailey Esquilin, PA-C 10/27/2022 5:15 PM

## 2022-10-28 ENCOUNTER — Ambulatory Visit: Payer: Federal, State, Local not specified - PPO | Admitting: Obstetrics and Gynecology

## 2022-10-28 ENCOUNTER — Ambulatory Visit
Admission: RE | Admit: 2022-10-28 | Discharge: 2022-10-28 | Disposition: A | Discharge status: Home / Self Care | Payer: Federal, State, Local not specified - PPO | Source: Ambulatory Visit | Attending: Family Medicine | Admitting: Family Medicine

## 2022-10-28 ENCOUNTER — Encounter: Payer: Self-pay | Admitting: Obstetrics and Gynecology

## 2022-10-28 VITALS — BP 114/78 | Ht 68.0 in | Wt 186.0 lb

## 2022-10-28 DIAGNOSIS — Z1231 Encounter for screening mammogram for malignant neoplasm of breast: Secondary | ICD-10-CM | POA: Insufficient documentation

## 2022-10-28 DIAGNOSIS — Z780 Asymptomatic menopausal state: Secondary | ICD-10-CM

## 2022-10-28 DIAGNOSIS — G4709 Other insomnia: Secondary | ICD-10-CM

## 2022-10-28 DIAGNOSIS — Z1211 Encounter for screening for malignant neoplasm of colon: Secondary | ICD-10-CM

## 2022-10-28 DIAGNOSIS — Z8041 Family history of malignant neoplasm of ovary: Secondary | ICD-10-CM

## 2022-10-28 DIAGNOSIS — Z01419 Encounter for gynecological examination (general) (routine) without abnormal findings: Secondary | ICD-10-CM | POA: Diagnosis not present

## 2022-10-28 DIAGNOSIS — D252 Subserosal leiomyoma of uterus: Secondary | ICD-10-CM

## 2022-10-28 DIAGNOSIS — F419 Anxiety disorder, unspecified: Secondary | ICD-10-CM

## 2022-10-28 MED ORDER — NORETHINDRONE ACETATE 5 MG PO TABS: ORAL_TABLET | ORAL | 3 refills | Status: DC

## 2022-10-28 MED ORDER — HYDROXYZINE HCL 25 MG PO TABS
25.0000 mg | ORAL_TABLET | Freq: Three times a day (TID) | ORAL | 2 refills | Status: AC | PRN
Start: 2022-10-28 — End: ?

## 2022-10-28 NOTE — Patient Instructions (Signed)
I value your feedback and you entrusting us with your care. If you get a [facility] patient survey, I would appreciate you taking the time to let us know about your experience today. Thank you! ? ? ?

## 2022-12-12 DIAGNOSIS — E782 Mixed hyperlipidemia: Secondary | ICD-10-CM | POA: Insufficient documentation

## 2023-01-14 ENCOUNTER — Ambulatory Visit: Admission: EM | Admit: 2023-01-14 | Discharge: 2023-01-14 | Payer: Federal, State, Local not specified - PPO

## 2023-01-14 ENCOUNTER — Emergency Department
Admission: EM | Admit: 2023-01-14 | Discharge: 2023-01-14 | Discharge status: Against Medical Advice | Payer: Federal, State, Local not specified - PPO | Source: Home / Self Care

## 2023-01-14 DIAGNOSIS — R519 Headache, unspecified: Secondary | ICD-10-CM

## 2023-01-14 DIAGNOSIS — H538 Other visual disturbances: Secondary | ICD-10-CM | POA: Diagnosis not present

## 2023-01-14 NOTE — Discharge Instructions (Addendum)
 Please go to [facility] to be evaluated for your worst headache of your life and blurry vision.  Please go now.

## 2023-01-14 NOTE — ED Notes (Signed)
 Patient is being discharged from the Urgent Care and sent to the Emergency Department via POV . Per Venetia Motto NP, patient is in need of higher level of care due to worse headache of her life & blurry vision. Patient is aware and verbalizes understanding of plan of care.  Vitals:   01/14/23 1300  BP: 127/85  Pulse: 71  Resp: 16  Temp: 99.8 F (37.7 C)  SpO2: 97%

## 2023-01-14 NOTE — ED Triage Notes (Signed)
 Pt c/o HA,blurred vision & elevated BP x5 days. States worse HA of her life. Spoke to triage nurse today & was told to go to ED. Highest being 150/96 today. Hx of HTN. Has tried tylenol w/o relief. Had some blurry vision today on the way to work.

## 2023-01-14 NOTE — ED Provider Notes (Signed)
 [facility]    CSN: [id number] Arrival date & time: 01/14/23  1222      History   Chief Complaint Chief Complaint  Patient presents with   Headache   Blurred Vision   Hypertension    HPI Donna Zuniga is a 48 y.o. female.   HPI  48 year old female with a past medical history significant for prediabetes, hypertension, IDA, and hyperlipidemia presents for evaluation of headache, blurred vision, and elevated blood pressure.  She reports her headache has been a sharp throbbing pain and has been constant.  She currently rates it as a 6-7/10.  She did take 1000 mg of Tylenol  for relief of her headache.  She reports that this morning on her way to work she did develop blurry vision.  That has resolved but the headache is still present.  She denies any numbness, tingling, weakness in any of her extremities.  She contacted her PCP for an appointment and was advised to go to the emergency department.  She opted to come to the urgent care instead.  Past Medical History:  Diagnosis Date   Anxiety    Cervical dysplasia    Depression    Family history of ovarian cancer    doesn't meet BCBS Fed genetic testing guidelines.   Heart palpitations    Dr. Tobie at [facility]   Leiomyoma 2014   Pre-diabetes    Vitamin D deficiency 11/2015    Patient Active Problem List   Diagnosis Date Noted   Hyperlipidemia, mixed 12/12/2022   Acute suppurative otitis media of left ear without spontaneous rupture of tympanic membrane 08/06/2022   Benign paroxysmal positional vertigo 08/06/2022   Enthesopathy of right hip 01/14/2022   Hypokalemia 11/15/2021   Prediabetes 05/10/2021   Iron deficiency anemia due to chronic blood loss 10/20/2020   COVID-19 12/28/2019   Hypertensive disorder 03/31/2019   Exposure to severe acute respiratory syndrome coronavirus 2 (SARS-CoV-2) 03/31/2019   Family history of ovarian cancer 12/17/2017   Overweight (BMI 25.0-29.9) 06/04/2017   Enlarged thyroid   12/10/2016   Nocturia 12/10/2016   Pelvic pain 09/16/2016   Uterine leiomyoma 09/16/2016   Situational mixed anxiety and depressive disorder 07/12/2016    Past Surgical History:  Procedure Laterality Date   COLPOSCOPY     LEEP      OB History     Gravida  4   Para  2   Term  2   Preterm      AB  2   Living  2      SAB      IAB      Ectopic      Multiple      Live Births  2            Home Medications    Prior to Admission medications   Medication Sig Start Date End Date Taking? Authorizing Provider  hydrochlorothiazide (HYDRODIURIL) 12.5 MG tablet Take 12.5 mg by mouth daily. 01/24/21  Yes [provider]  hydrochlorothiazide (MICROZIDE) 12.5 MG capsule Take by mouth. 03/07/21  Yes [provider]  hydrOXYzine  (ATARAX ) 25 MG tablet Take 1 tablet (25 mg total) by mouth every 8 (eight) hours as needed for itching. 10/28/22  Yes Copland, Alicia B, PA-C  norethindrone  (AYGESTIN ) 5 MG tablet TAKE 1 TABLET(5 MG) BY MOUTH DAILY 10/28/22  Yes Copland, Alicia B, PA-C  WEGOVY 2.4 MG/0.75ML SOAJ SMARTSIG:2.4 Milligram(s) SUB-Q Once a Week   Yes [provider]  loratadine -pseudoephedrine  (LORATADINE -D 24HR) 10-240  MG 24 hr tablet Take 1 tablet by mouth daily. 10/21/22   Loreda Myla SAUNDERS, NP  ferrous sulfate  (FERROUSUL) 325 (65 FE) MG tablet Take 1 tablet (325 mg total) by mouth 2 (two) times daily. 10/21/19 02/09/21  Lake Read, MD    Family History Family History  Problem Relation Age of Onset   Ovarian cancer Maternal Aunt 70   Hypertension Father    Other Mother        fibroids/leiomyoma of uterus   Breast cancer Cousin    Breast cancer Cousin     Social History Social History   Tobacco Use   Smoking status: Former   Smokeless tobacco: Never  Advertising Account Planner   Vaping status: Never Used  Substance Use Topics   Alcohol use: Yes    Comment: occ   Drug use: No     Allergies   Bupropion   Review of Systems Review of  Systems  Constitutional:  Negative for fever.  Eyes:  Positive for visual disturbance.  Neurological:  Positive for headaches. Negative for weakness and numbness.     Physical Exam Triage Vital Signs ED Triage Vitals  Encounter Vitals Group     BP      Systolic BP Percentile      Diastolic BP Percentile      Pulse      Resp      Temp      Temp src      SpO2      Weight      Height      Head Circumference      Peak Flow      Pain Score      Pain Loc      Pain Education      Exclude from Growth Chart    No data found.  Updated Vital Signs BP 127/85 (BP Location: Left Arm)   Pulse 71   Temp 99.8 F (37.7 C) (Oral)   Resp 16   Ht 5' 8 (1.727 m)   Wt 180 lb (81.6 kg)   SpO2 97%   BMI 27.37 kg/m   Visual Acuity Right Eye Distance:   Left Eye Distance:   Bilateral Distance:    Right Eye Near:   Left Eye Near:    Bilateral Near:     Physical Exam Vitals and nursing note reviewed.  Constitutional:      Appearance: Normal appearance. She is not ill-appearing.  HENT:     Head: Normocephalic and atraumatic.     Right Ear: Tympanic membrane, ear canal and external ear normal. There is no impacted cerumen.     Left Ear: Tympanic membrane, ear canal and external ear normal. There is no impacted cerumen.  Skin:    General: Skin is warm and dry.     Capillary Refill: Capillary refill takes less than 2 seconds.  Neurological:     General: No focal deficit present.     Mental Status: She is alert and oriented to person, place, and time.      UC Treatments / Results  Labs (all labs ordered are listed, but only abnormal results are displayed) Labs Reviewed - No data to display  EKG   Radiology No results found.  Procedures Procedures (including critical care time)  Medications Ordered in UC Medications - No data to display  Initial Impression / Assessment and Plan / UC Course  I have reviewed the triage vital signs and the nursing notes.  Pertinent  labs & imaging results that were available during my care of the patient were reviewed by me and considered in my medical decision making (see chart for details).   Patient is a nontoxic-appearing 48 year old female presenting for evaluation of headache which she describes as the worst headache of her life that started 5 days ago and is a constant sharp throbbing rating 6-7/10.  Associated with blurry vision that started this morning.  No numbness, tingling, or weakness in any of her extremities.  She was advised to go to the emergency department and came to urgent care for evaluation.  I advised the patient that she does need to be evaluated in the ER as she needs imaging of her brain as well as possible specialty consultation given that she is complaining of the worst headache of her life.  She states that she does not normally get headaches so that is why she is describing as the worst headache of her life.  I informed her that regardless, given that she does not typically have headaches and now has had a constant headache for 5 days with associated changes to vision that necessitates brain imaging which we are unable to perform here at urgent care.  She has elected to go to [facility].  She left ambulatory in stable condition.   Final Clinical Impressions(s) / UC Diagnoses   Final diagnoses:  Acute intractable headache, unspecified headache type  Blurry vision, bilateral     Discharge Instructions      Please go to [facility] to be evaluated for your worst headache of your life and blurry vision.  Please go now.     ED Prescriptions   None    PDMP not reviewed this encounter.   Bernardino Ditch, NP 01/14/23 1325

## 2023-02-19 ENCOUNTER — Telehealth: Payer: Self-pay | Admitting: Obstetrics and Gynecology

## 2023-02-19 NOTE — Telephone Encounter (Signed)
The patient is calling to speak with you about her medication and about paper work she needs filled out. Please advise?

## 2023-02-20 NOTE — Telephone Encounter (Signed)
Called pt and she was unable to give me more information, she was walking into [employer] and did not want to discuss. Stated she would send [place] a [employer].

## 2023-02-20 NOTE — Telephone Encounter (Signed)
Pls get clarification on this. Thx.

## 2023-02-24 ENCOUNTER — Encounter: Payer: Self-pay | Admitting: Obstetrics and Gynecology

## 2023-02-26 NOTE — Telephone Encounter (Signed)
Call pt Thurs PM

## 2023-02-27 NOTE — Telephone Encounter (Signed)
Pls fax document on desk and keep eye out while I'm out of town. Thx.

## 2023-02-27 NOTE — Telephone Encounter (Signed)
Spoke with pt. Pt has been on several wt loss meds that didn't work/had side effects. Heart palpitations with phentermine and Qsymia, no appetite change with contrave. Pt did well with ozempic (now you need to have Type 2 DM dx) and wegovy with significant wt loss. Pt was pre-DM but no longer so due to wt loss. Tier coverage of wegovy has changed to Tier 3 and now not affordable. Pt would like appeal form completed for insurance co. Pt's PCP not doing them anymore because still usually not covered. Pt is stress-eater and recently lost best friend, which has triggered wt gain. This is very upsetting to pt and adding to her increased depression sx (due to loss of best friend). Pt gained significant wt when lost dad a few yrs ago. Hx of depression.  Insurance appeal form completed. CMA to fax and keep eye on authorization info.

## 2023-02-28 NOTE — Telephone Encounter (Signed)
Fax sent. Confirmation printed.

## 2023-03-06 ENCOUNTER — Encounter: Payer: Self-pay | Admitting: Obstetrics and Gynecology

## 2023-03-10 NOTE — Telephone Encounter (Signed)
 Pls re-fax the form on Thursday that I completed for this patient 1 1/2 wks ago.

## 2023-03-13 NOTE — Telephone Encounter (Signed)
Pls look into this

## 2023-03-17 NOTE — Telephone Encounter (Signed)
 Pt called office to f/u on her last msg. She said she called insurance and apparently representatives she has talked to were giving her incorrect information. She asked to be transferred to a supervisor and supervisor told her they need to speak to you directly.

## 2023-06-26 ENCOUNTER — Ambulatory Visit: Admitting: Obstetrics and Gynecology

## 2023-06-27 ENCOUNTER — Ambulatory Visit: Admitting: Obstetrics and Gynecology

## 2023-06-30 ENCOUNTER — Ambulatory Visit: Admitting: Dermatology

## 2023-07-23 ENCOUNTER — Ambulatory Visit: Admitting: Dermatology

## 2023-07-28 ENCOUNTER — Ambulatory Visit: Admitting: Dermatology

## 2023-07-31 HISTORY — PX: OTHER SURGICAL HISTORY: SHX169

## 2023-09-04 ENCOUNTER — Ambulatory Visit: Admitting: Dermatology

## 2023-09-29 ENCOUNTER — Encounter: Payer: Self-pay | Admitting: Obstetrics and Gynecology

## 2023-09-29 ENCOUNTER — Ambulatory Visit (INDEPENDENT_AMBULATORY_CARE_PROVIDER_SITE_OTHER): Admitting: Obstetrics and Gynecology

## 2023-09-29 VITALS — BP 125/78 | HR 86 | Ht 68.0 in | Wt 195.0 lb

## 2023-09-29 DIAGNOSIS — Z1231 Encounter for screening mammogram for malignant neoplasm of breast: Secondary | ICD-10-CM

## 2023-09-29 DIAGNOSIS — N644 Mastodynia: Secondary | ICD-10-CM

## 2023-09-29 NOTE — Progress Notes (Signed)
 Donna Bette Hover, MD   Chief Complaint  Patient presents with   Breast Exam    Left breast pain x 3 weeks    HPI:      [name] is a 48 y.o. [id number] whose LMP was No LMP recorded. Patient has had an ablation., presents today for LT breast pain for 3 wks. Sx intermittent daily and are sharp pains in outer quadrant to front of breast; notes areola gets hard and pt has to massage breast. Pain triggered by certain movements and position of breasts. Denies any erythema/trauma/nipple d/c/masses. Does drink caffeine daily. On POPs without BTB/dysmen. Hx of leio.   Neg mammo 10/24; had addl imaging LT breast 10/23 with neg results. FH ovarian cancer in her mat aunt and breast cancer in 2 cousins but pt doesn't meet BCBS Fed cancer genetic testing requirements.   Patient Active Problem List   Diagnosis Date Noted   Hyperlipidemia, mixed 12/12/2022   Acute suppurative otitis media of left ear without spontaneous rupture of tympanic membrane 08/06/2022   Benign paroxysmal positional vertigo 08/06/2022   Enthesopathy of right hip 01/14/2022   Hypokalemia 11/15/2021   Prediabetes 05/10/2021   Iron deficiency anemia due to chronic blood loss 10/20/2020   COVID-19 12/28/2019   Hypertensive disorder 03/31/2019   Exposure to severe acute respiratory syndrome coronavirus 2 (SARS-CoV-2) 03/31/2019   Family history of ovarian cancer 12/17/2017   Overweight (BMI 25.0-29.9) 06/04/2017   Enlarged thyroid  12/10/2016   Nocturia 12/10/2016   Pelvic pain 09/16/2016   Uterine leiomyoma 09/16/2016   Situational mixed anxiety and depressive disorder 07/12/2016    Past Surgical History:  Procedure Laterality Date   COLPOSCOPY     LEEP      Family History  Problem Relation Age of Onset   Ovarian cancer Maternal Aunt 50   Hypertension Father    Other Mother        fibroids/leiomyoma of uterus   Breast cancer Cousin    Breast cancer Cousin     Social History   Socioeconomic  History   Marital status: Married    Spouse name: Not on file   Number of children: Not on file   Years of education: Not on file   Highest education level: Not on file  Occupational History   Not on file  Tobacco Use   Smoking status: Former   Smokeless tobacco: Never  Vaping Use   Vaping status: Never Used  Substance and Sexual Activity   Alcohol use: Yes    Comment: occ   Drug use: No   Sexual activity: Yes    Birth control/protection: Pill  Other Topics Concern   Not on file  Social History Narrative   Not on file   Social Drivers of Health   Financial Resource Strain: Medium Risk (11/18/2022)   Received from [facility]   Overall Financial Resource Strain (CARDIA)    Difficulty of Paying Living Expenses: Somewhat hard  Food Insecurity: Food Insecurity Present (11/18/2022)   Received from [facility]   Hunger Vital Sign    Within the past 12 months, you worried that your food would run out before you got the money to buy more.: Sometimes true    Within the past 12 months, the food you bought just didn't last and you didn't have money to get more.: Never true  Transportation Needs: No Transportation Needs (11/18/2022)   Received from [facility]   PRAPARE -  Transportation    In the past 12 months, has lack of transportation kept you from medical appointments or from getting medications?: No    Lack of Transportation (Non-Medical): No  Physical Activity: Not on file  Stress: Not on file  Social Connections: Not on file  Intimate Partner Violence: Not on file    Outpatient Medications Prior to Visit  Medication Sig Dispense Refill   Cholecalciferol (VITAMIN D-3 PO) Take by mouth.     hydrochlorothiazide (MICROZIDE) 12.5 MG capsule Take by mouth.     hydrOXYzine  (ATARAX ) 25 MG tablet Take 1 tablet (25 mg total) by mouth every 8 (eight) hours as needed for itching. 30 tablet 2   ibuprofen  (ADVIL ) 100 MG chewable  tablet Chew 800 mg by mouth.     ipratropium (ATROVENT) 0.06 % nasal spray Place into the nose.     norethindrone  (AYGESTIN ) 5 MG tablet TAKE 1 TABLET(5 MG) BY MOUTH DAILY 90 tablet 3   TURMERIC PO Take by mouth.     phentermine  (ADIPEX-P ) 37.5 MG tablet Take 37.5 mg by mouth every morning. (Patient not taking: Reported on 09/29/2023)     potassium chloride SA (KLOR-CON M) 20 MEQ tablet Take 20 mEq by mouth. (Patient not taking: Reported on 09/29/2023)     WEGOVY 0.5 MG/0.5ML SOAJ SQ injection Inject 0.5 mg into the skin once a week. (Patient not taking: Reported on 09/29/2023)     WEGOVY 1 MG/0.5ML SOAJ SQ injection Inject 1 mg into the skin once a week. (Patient not taking: Reported on 09/29/2023)     WEGOVY 2.4 MG/0.75ML SOAJ SMARTSIG:2.4 Milligram(s) SUB-Q Once a Week (Patient not taking: Reported on 09/29/2023)     hydrochlorothiazide (HYDRODIURIL) 12.5 MG tablet Take 12.5 mg by mouth daily.     loratadine -pseudoephedrine  (LORATADINE -D 24HR) 10-240 MG 24 hr tablet Take 1 tablet by mouth daily. 30 tablet 0   No facility-administered medications prior to visit.      ROS:  Review of Systems  Constitutional:  Negative for fever.  Gastrointestinal:  Negative for blood in stool, constipation, diarrhea, nausea and vomiting.  Genitourinary:  Negative for dyspareunia, dysuria, flank pain, frequency, hematuria, urgency, vaginal bleeding, vaginal discharge and vaginal pain.  Musculoskeletal:  Negative for back pain.  Skin:  Negative for rash.   BREAST: pain   OBJECTIVE:   Vitals:  BP 125/78   Pulse 86   Ht 5' 8 (1.727 m)   Wt 195 lb (88.5 kg)   BMI 29.65 kg/m   Physical Exam Vitals reviewed.  Pulmonary:     Effort: Pulmonary effort is normal.  Chest:  Breasts:    Breasts are symmetrical.     Right: No inverted nipple, mass, nipple discharge, skin change or tenderness.     Left: Tenderness present. No inverted nipple, mass, nipple discharge or skin change.    Musculoskeletal:         General: Normal range of motion.     Cervical back: Normal range of motion.  Skin:    General: Skin is warm and dry.  Neurological:     General: No focal deficit present.     Mental Status: She is alert and oriented to person, place, and time.     Cranial Nerves: No cranial nerve deficit.  Psychiatric:        Mood and Affect: Mood normal.        Behavior: Behavior normal.        Thought Content: Thought content normal.  Judgment: Judgment normal.    Assessment/Plan: Encounter for screening mammogram for malignant neoplasm of breast - Plan: US  LIMITED ULTRASOUND INCLUDING AXILLA RIGHT BREAST, US  LIMITED ULTRASOUND INCLUDING AXILLA LEFT BREAST , MM 3D DIAGNOSTIC MAMMOGRAM BILATERAL BREAST  Breast pain, left - Plan: US  LIMITED ULTRASOUND INCLUDING AXILLA RIGHT BREAST, US  LIMITED ULTRASOUND INCLUDING AXILLA LEFT BREAST , MM 3D DIAGNOSTIC MAMMOGRAM BILATERAL BREAST  Pt to call to schedule mammo and u/s. Will f/u with results. D/C caffeine. If neg, could be hormonal as well.     Return if symptoms worsen or fail to improve.  Maddox Hlavaty B. Zadaya Cuadra, PA-C 09/29/2023 4:27 PM

## 2023-09-29 NOTE — Patient Instructions (Addendum)
 I value your feedback and you entrusting [place] with your care. If you get a [facility] patient survey, I would appreciate you taking the time to let us know about your experience today. Thank you!  [facility] ([place])--[phone]

## 2023-09-30 ENCOUNTER — Ambulatory Visit
Admission: RE | Admit: 2023-09-30 | Discharge: 2023-09-30 | Disposition: A | Discharge status: Home / Self Care | Source: Ambulatory Visit | Attending: Obstetrics and Gynecology | Admitting: Obstetrics and Gynecology

## 2023-09-30 ENCOUNTER — Ambulatory Visit: Admitting: Obstetrics and Gynecology

## 2023-09-30 DIAGNOSIS — Z1231 Encounter for screening mammogram for malignant neoplasm of breast: Secondary | ICD-10-CM

## 2023-09-30 DIAGNOSIS — N644 Mastodynia: Secondary | ICD-10-CM | POA: Insufficient documentation

## 2023-10-02 ENCOUNTER — Ambulatory Visit: Payer: Self-pay | Admitting: Obstetrics and Gynecology

## 2023-10-02 NOTE — Telephone Encounter (Signed)
 This encounter was created in error - please disregard.

## 2023-10-02 NOTE — Addendum Note (Signed)
 Addended by: WATT HILA B on: 10/02/2023 08:40 AM   Modules accepted: Orders, Level of Service

## 2023-10-07 ENCOUNTER — Encounter: Payer: Self-pay | Admitting: Obstetrics and Gynecology

## 2023-10-07 DIAGNOSIS — N644 Mastodynia: Secondary | ICD-10-CM

## 2023-10-07 DIAGNOSIS — Z1211 Encounter for screening for malignant neoplasm of colon: Secondary | ICD-10-CM

## 2023-10-21 ENCOUNTER — Telehealth: Payer: Self-pay

## 2023-10-21 NOTE — Telephone Encounter (Signed)
 Pt requesting call back to schedule colonoscopy

## 2023-10-26 ENCOUNTER — Encounter: Payer: Self-pay | Admitting: Obstetrics and Gynecology

## 2023-10-27 ENCOUNTER — Other Ambulatory Visit: Payer: Self-pay | Admitting: Obstetrics and Gynecology

## 2023-10-30 ENCOUNTER — Telehealth: Payer: Self-pay

## 2023-10-30 ENCOUNTER — Other Ambulatory Visit: Payer: Self-pay

## 2023-10-30 DIAGNOSIS — Z1211 Encounter for screening for malignant neoplasm of colon: Secondary | ICD-10-CM

## 2023-10-30 MED ORDER — PEG 3350-KCL-NA BICARB-NACL 420 G PO SOLR: 4000.0000 mL | Freq: Once | ORAL | 0 refills | Status: AC

## 2023-10-30 NOTE — Telephone Encounter (Signed)
 Gastroenterology Pre-Procedure Review  Request Date: 01/23/23 Requesting Physician: Dr. Melany  PATIENT REVIEW QUESTIONS: The patient responded to the following health history questions as indicated:    1. Are you having any GI issues? no 2. Do you have a personal history of Polyps? no 3. Do you have a family history of Colon Cancer or Polyps? no 4. Diabetes Mellitus? no 5. Joint replacements in the past 12 months?no 6. Major health problems in the past 3 months?no 7. Any artificial heart valves, MVP, or defibrillator?no    MEDICATIONS & ALLERGIES:    Patient reports the following regarding taking any anticoagulation/antiplatelet therapy:   Plavix, Coumadin, Eliquis, Xarelto, Lovenox, Pradaxa, Brilinta, or Effient? no Aspirin ? no  Patient confirms/reports the following medications:  Current Outpatient Medications  Medication Sig Dispense Refill   Cholecalciferol (VITAMIN D-3 PO) Take by mouth.     hydrochlorothiazide (MICROZIDE) 12.5 MG capsule Take by mouth.     hydrOXYzine  (ATARAX ) 25 MG tablet Take 1 tablet (25 mg total) by mouth every 8 (eight) hours as needed for itching. 30 tablet 2   ibuprofen  (ADVIL ) 100 MG chewable tablet Chew 800 mg by mouth.     ipratropium (ATROVENT) 0.06 % nasal spray Place into the nose.     norethindrone  (AYGESTIN ) 5 MG tablet TAKE 1 TABLET(5 MG) BY MOUTH DAILY 90 tablet 3   phentermine  (ADIPEX-P ) 37.5 MG tablet Take 37.5 mg by mouth every morning. (Patient not taking: Reported on 09/29/2023)     potassium chloride SA (KLOR-CON M) 20 MEQ tablet Take 20 mEq by mouth. (Patient not taking: Reported on 09/29/2023)     TURMERIC PO Take by mouth.     WEGOVY 0.5 MG/0.5ML SOAJ SQ injection Inject 0.5 mg into the skin once a week. (Patient not taking: Reported on 09/29/2023)     WEGOVY 1 MG/0.5ML SOAJ SQ injection Inject 1 mg into the skin once a week. (Patient not taking: Reported on 09/29/2023)     WEGOVY 2.4 MG/0.75ML SOAJ SMARTSIG:2.4 Milligram(s) SUB-Q Once a  Week (Patient not taking: Reported on 09/29/2023)     No current facility-administered medications for this visit.    Patient confirms/reports the following allergies:  Allergies  Allergen Reactions   Bupropion Other (See Comments)    No orders of the defined types were placed in this encounter.   AUTHORIZATION INFORMATION Primary Insurance: 1D#: Group #:  Secondary Insurance: 1D#: Group #:  SCHEDULE INFORMATION: Date: 01/23/24 Time: Location: [facility]

## 2023-11-04 ENCOUNTER — Ambulatory Visit: Admitting: Obstetrics and Gynecology

## 2023-11-04 ENCOUNTER — Encounter: Payer: Self-pay | Admitting: Obstetrics and Gynecology

## 2023-11-04 ENCOUNTER — Other Ambulatory Visit (HOSPITAL_COMMUNITY)
Admission: RE | Admit: 2023-11-04 | Discharge: 2023-11-04 | Disposition: A | Discharge status: Home / Self Care | Source: Ambulatory Visit | Attending: Obstetrics and Gynecology | Admitting: Obstetrics and Gynecology

## 2023-11-04 VITALS — BP 110/60 | Ht 68.0 in | Wt 195.0 lb

## 2023-11-04 DIAGNOSIS — Z124 Encounter for screening for malignant neoplasm of cervix: Secondary | ICD-10-CM | POA: Diagnosis present

## 2023-11-04 DIAGNOSIS — D251 Intramural leiomyoma of uterus: Secondary | ICD-10-CM | POA: Diagnosis not present

## 2023-11-04 DIAGNOSIS — Z1231 Encounter for screening mammogram for malignant neoplasm of breast: Secondary | ICD-10-CM

## 2023-11-04 DIAGNOSIS — F419 Anxiety disorder, unspecified: Secondary | ICD-10-CM

## 2023-11-04 DIAGNOSIS — Z01411 Encounter for gynecological examination (general) (routine) with abnormal findings: Secondary | ICD-10-CM | POA: Diagnosis not present

## 2023-11-04 DIAGNOSIS — Z8041 Family history of malignant neoplasm of ovary: Secondary | ICD-10-CM

## 2023-11-04 DIAGNOSIS — Z1151 Encounter for screening for human papillomavirus (HPV): Secondary | ICD-10-CM | POA: Insufficient documentation

## 2023-11-04 DIAGNOSIS — Z1211 Encounter for screening for malignant neoplasm of colon: Secondary | ICD-10-CM

## 2023-11-04 DIAGNOSIS — D252 Subserosal leiomyoma of uterus: Secondary | ICD-10-CM | POA: Diagnosis not present

## 2023-11-04 DIAGNOSIS — R634 Abnormal weight loss: Secondary | ICD-10-CM

## 2023-11-04 DIAGNOSIS — G4709 Other insomnia: Secondary | ICD-10-CM

## 2023-11-04 DIAGNOSIS — R5382 Chronic fatigue, unspecified: Secondary | ICD-10-CM

## 2023-11-04 DIAGNOSIS — Z01419 Encounter for gynecological examination (general) (routine) without abnormal findings: Secondary | ICD-10-CM

## 2023-11-04 MED ORDER — PHENTERMINE HCL 37.5 MG PO TABS: 37.5000 mg | ORAL_TABLET | Freq: Every morning | ORAL | 0 refills | Status: DC

## 2023-11-04 MED ORDER — NORETHINDRONE ACETATE 5 MG PO TABS: ORAL_TABLET | ORAL | 3 refills | Status: AC

## 2023-11-04 NOTE — Progress Notes (Signed)
 PCP:  Eliverto Bette Hover, MD   Chief Complaint  Patient presents with   Gynecologic Exam    No concerns     HPI:      [name] is a 48 y.o. No obstetric history on file. who LMP was No LMP recorded. Patient has had an ablation., presents today for her annual examination.  Her menses are absent on aygestin  5 mg. Has BTB if misses pills. Was having AUB and diagnosed with 3 leio 4/21, s/p UAE due to leio, was on depo lupron , changed to aygestin  daily. Leio decreased in size on 2 GYN u/s. Dr. Lake kept pt on aygestin  after 10/22 appt.  Hx of IDA due to AUB, no longer taking Fe supp; normal CBC 4/25.  Hx of HTN.   Has night sweats and trouble sleeping. With significant fatigue; wakes about 2:00-3:00 and can't go back to sleep; alarm is at 4:30. Tries to go to bed about 8:00 PM. Doesn't snore or wake up choking but has a hard time staying awake at work/falling asleep. Has enlarged thyroid . PCP tried to do sleep study ref but pt didn't hear about appt. Not exercising, hasn't done melatonin or tried her hydroxyzine  for sleep.   Sex activity: single partner, contraception - on aygestin  daily. No pain/bleeding/dryness. Last Pap: 10/20/20 Results were: neg/neg HPV DNA. Likes yearly paps but not indicated.  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 Is trying to decrease caffeine and change bras; hasn't tried Vit E or evening primrose oil.  There is a FH of breast cancer in 2 maternal cousins. There is a FH of ovarian cancer in her mat aunt, genetic testing hasn't been done. Pt has declined genetic testing in past. The patient does do self-breast exams.   Tobacco use: Quit tobacco several yrs ago Alcohol use: none No drug use.  Exercise: not active, got out of habit of going to gym  Colonoscopy: never; had appt 1/26  She does get  adequate calcium and Vitamin D in her diet.  She has a hx of enlarged thyroid , followed by PCP. Other labs with PCP, too   Takes hydroxyzine  sparingly prn anxiety; doesn't need RF currently  PCP referred pt to wt loss clinic. Would like RF on phentermine  for 1 more month till sees wt mgmt; also helps with energy for work.    Past Medical History:  Diagnosis Date   Anxiety    Cervical dysplasia    Depression    Family history of ovarian cancer    doesn't meet BCBS Fed genetic testing guidelines.   Heart palpitations    Dr[facility] Tobie [facility]   Leiomyoma 2014   Pre-diabetes    Vitamin D deficiency 11/2015    Past Surgical History:  Procedure Laterality Date   COLPOSCOPY     LEEP     OTHER SURGICAL HISTORY  07/31/2023   Right Hand    Family History  Problem Relation Age of Onset   Ovarian cancer Maternal Aunt 50   Hypertension Father    Other Mother        fibroids/leiomyoma of uterus   Breast cancer Cousin    Breast cancer Cousin     Social History   Socioeconomic History   Marital status: Married    Spouse name: Not on file  Number of children: Not on file   Years of education: Not on file   Highest education level: Not on file  Occupational History   Not on file  Tobacco Use   Smoking status: Former   Smokeless tobacco: Never  Vaping Use   Vaping status: Never Used  Substance and Sexual Activity   Alcohol use: Yes    Comment: occ   Drug use: No   Sexual activity: Yes    Birth control/protection: Pill  Other Topics Concern   Not on file  Social History Narrative   Not on file   Social Drivers of Health   Financial Resource Strain: Medium Risk (11/18/2022)   Received from [facility]   Overall Financial Resource Strain (CARDIA)    Difficulty of Paying Living Expenses: Somewhat hard  Food Insecurity: Food Insecurity Present (11/18/2022)   Received from [facility]   Hunger Vital Sign    Within the past 12 months,  you worried that your food would run out before you got the money to buy more.: Sometimes true    Within the past 12 months, the food you bought just didn't last and you didn't have money to get more.: Never true  Transportation Needs: No Transportation Needs (11/18/2022)   Received from [facility]    In the past 12 months, has lack of transportation kept you from medical appointments or from getting medications?: No    Lack of Transportation (Non-Medical): No  Physical Activity: Not on file  Stress: Not on file  Social Connections: Not on file  Intimate Partner Violence: Not on file    Current Meds  Medication Sig   Cholecalciferol (VITAMIN D-3 PO) Take by mouth.   hydrochlorothiazide (MICROZIDE) 12.5 MG capsule Take by mouth.   hydrOXYzine  (ATARAX ) 25 MG tablet Take 1 tablet (25 mg total) by mouth every 8 (eight) hours as needed for itching.   ibuprofen  (ADVIL ) 100 MG chewable tablet Chew 800 mg by mouth.   ipratropium (ATROVENT) 0.06 % nasal spray Place into the nose.   TURMERIC PO Take by mouth.   [DISCONTINUED] norethindrone  (AYGESTIN ) 5 MG tablet TAKE 1 TABLET(5 MG) BY MOUTH DAILY     ROS:  Review of Systems  Constitutional:  Positive for fatigue. Negative for fever and unexpected weight change.  Respiratory:  Negative for cough, shortness of breath and wheezing.   Cardiovascular:  Negative for chest pain, palpitations and leg swelling.  Gastrointestinal:  Negative for blood in stool, constipation, diarrhea, nausea and vomiting.  Endocrine: Negative for cold intolerance, heat intolerance and polyuria.  Genitourinary:  Negative for dyspareunia, dysuria, flank pain, frequency, genital sores, hematuria, menstrual problem, pelvic pain, urgency, vaginal bleeding, vaginal discharge and vaginal pain.  Musculoskeletal:  Negative for back pain, joint swelling and myalgias.  Skin:  Negative for rash.  Neurological:  Negative for dizziness,  syncope, light-headedness, numbness and headaches.  Hematological:  Negative for adenopathy.  Psychiatric/Behavioral:  Positive for agitation and dysphoric mood. Negative for confusion, sleep disturbance and suicidal ideas. The patient is not nervous/anxious.      Objective: BP 110/60   Ht 5' 8 (1.727 m)   Wt 195 lb (88.5 kg)   BMI 29.65 kg/m    Physical Exam Constitutional:      Appearance: She is well-developed.  Genitourinary:     Vulva normal.     Right Labia: No rash, tenderness or lesions.    Left Labia: No  tenderness, lesions or rash.    No vaginal discharge, erythema or tenderness.      Right Adnexa: not tender and no mass present.    Left Adnexa: not tender and no mass present.    No cervical motion tenderness, friability or polyp.     Uterus is enlarged.     Uterus is not tender.  Breasts:    Right: No mass, nipple discharge, skin change or tenderness.     Left: No mass, nipple discharge, skin change or tenderness.  Neck:     Thyroid : Thyromegaly present.  Cardiovascular:     Rate and Rhythm: Normal rate and regular rhythm.     Heart sounds: Normal heart sounds. No murmur heard. Pulmonary:     Effort: Pulmonary effort is normal.     Breath sounds: Normal breath sounds.  Abdominal:     Palpations: Abdomen is soft.     Tenderness: There is no abdominal tenderness. There is no guarding or rebound.  Musculoskeletal:        General: Normal range of motion.     Cervical back: Normal range of motion.  Lymphadenopathy:     Cervical: No cervical adenopathy.  Neurological:     General: No focal deficit present.     Mental Status: She is alert and oriented to person, place, and time.     Cranial Nerves: No cranial nerve deficit.  Skin:    General: Skin is warm and dry.  Psychiatric:        Mood and Affect: Mood normal.        Behavior: Behavior normal.        Thought Content: Thought content normal.        Judgment: Judgment normal.  Vitals reviewed.      Assessment/Plan:  Encounter for annual routine gynecological examination  Cervical cancer screening - Plan: Cytology - PAP  Screening for HPV (human papillomavirus) - Plan: Cytology - PAP  Encounter for screening mammogram for malignant neoplasm of breast; pt current on mammo; has MRI scheduled due to LT breast pain but may postpone it since mammo neg. Add Vit E/evening primrose oil/d/c caffeine.   Family history of ovarian cancer--MyRisk testing discussed and pt to f/u if desires  Intramural and subserous leiomyoma of uterus - Plan: norethindrone  (AYGESTIN ) 5 MG tablet; Rx RF eRxd, doing well  Screening for colon cancer--as 1/26 appt  Other insomnia - Plan: Ambulatory referral to Sleep Studies; try melatonin/add exercise. Refer to sleep study due to enlarged thyroid  and sx during the day. Discussed sx can also be due to stress/perimenopause but on prog already and sx severity not related to hormones.   Chronic fatigue - Plan: Ambulatory referral to Sleep Studies  Weight loss - Plan: phentermine  (ADIPEX-P ) 37.5 MG tablet; Rx RF for 1 month, then has wt mgmt appt. Has lost wt with Rx in past.    Meds ordered this encounter  Medications   phentermine  (ADIPEX-P ) 37.5 MG tablet    Sig: Take 1 tablet (37.5 mg total) by mouth every morning.    Dispense:  30 tablet    Refill:  0    Supervising Provider:   ROBY, MICIA [[id number]]   norethindrone  (AYGESTIN ) 5 MG tablet    Sig: TAKE 1 TABLET(5 MG) BY MOUTH DAILY    Dispense:  90 tablet    Refill:  3    Supervising Provider:   ROBY, MICIA [[id number]]      GYN counsel breast self exam, mammography screening, adequate intake  of calcium and vitamin D, diet and exercise     F/U  Return in about 1 year (around 11/03/2024).  Jalani Cullifer B. Kinsley Nicklaus, PA-C 11/04/2023 5:03 PM

## 2023-11-04 NOTE — Patient Instructions (Signed)
 I value your feedback and you entrusting [place] with your care. If you get a [facility] patient survey, I would appreciate you taking the time to let us know about your experience today. Thank you! ? ? ?

## 2023-11-05 ENCOUNTER — Encounter: Payer: Self-pay | Admitting: Obstetrics and Gynecology

## 2023-11-07 LAB — CYTOLOGY - PAP
Comment: NEGATIVE
Diagnosis: NEGATIVE
High risk HPV: NEGATIVE

## 2023-11-08 ENCOUNTER — Other Ambulatory Visit

## 2023-12-10 ENCOUNTER — Encounter: Payer: Self-pay | Admitting: Obstetrics and Gynecology

## 2023-12-10 ENCOUNTER — Other Ambulatory Visit: Payer: Self-pay | Admitting: Obstetrics and Gynecology

## 2023-12-10 DIAGNOSIS — R634 Abnormal weight loss: Secondary | ICD-10-CM

## 2023-12-10 MED ORDER — PHENTERMINE HCL 37.5 MG PO TABS: 37.5000 mg | ORAL_TABLET | Freq: Every morning | ORAL | 0 refills | Status: AC

## 2023-12-10 NOTE — Progress Notes (Signed)
 Rx RF until  appt with wt mgmt in 2 wks.  Meds ordered this encounter  Medications   phentermine  (ADIPEX-P ) 37.5 MG tablet    Sig: Take 1 tablet (37.5 mg total) by mouth every morning.    Dispense:  30 tablet    Refill:  0    Supervising Provider:   LEIGH SOBER [[id number]]

## 2024-01-05 ENCOUNTER — Ambulatory Visit: Admitting: Dermatology

## 2024-01-15 ENCOUNTER — Encounter: Payer: Self-pay | Admitting: Dermatology

## 2024-01-15 ENCOUNTER — Ambulatory Visit: Admitting: Dermatology

## 2024-01-15 DIAGNOSIS — L918 Other hypertrophic disorders of the skin: Secondary | ICD-10-CM

## 2024-01-15 DIAGNOSIS — L821 Other seborrheic keratosis: Secondary | ICD-10-CM | POA: Diagnosis not present

## 2024-01-15 NOTE — Patient Instructions (Signed)

## 2024-01-15 NOTE — Progress Notes (Unsigned)
" ° °  New Patient Visit   Subjective  Donna Zuniga is a 49 y.o. female who presents for the following: spots that are irritated at neck and face. She has had some removed in the past. Patient also concerned about the dark circles and puffiness under eyes.   The following portions of the chart were reviewed this encounter and updated as appropriate: medications, allergies, medical history  Review of Systems:  No other skin or systemic complaints except as noted in HPI or Assessment and Plan.  Objective  Well appearing patient in no apparent distress; mood and affect are within normal limits.   A focused examination was performed of the following areas: Face, neck  Relevant exam findings are noted in the Assessment and Plan.    Assessment & Plan   SEBORRHEIC KERATOSIS - Stuck-on, waxy, tan-brown papules and/or plaques  - Benign-appearing - Discussed benign etiology and prognosis. - Observe - Call for any changes - discussed that removal of these is considered cosmetic if they are not inflamed or  Irritated - pathology on previously removed lesion shows it was an ISK, which would be covered by insurance  Acrochordons (Skin Tags) - Fleshy, skin-colored pedunculated papules - Benign appearing.  - Observe. - If desired, they can be removed with an in office procedure that is not covered by insurance. Cosmetic fee applies - Please call the clinic if you notice any new or changing lesions. - Patient will follow up with PCP for removal.   SEBORRHEIC KERATOSES   ACROCHORDON    Return if symptoms worsen or fail to improve.  LILLETTE Lonell Drones, RMA, am acting as scribe for Boneta Sharps, MD .   Documentation: I have reviewed the above documentation for accuracy and completeness, and I agree with the above.  Boneta Sharps, MD    "

## 2024-01-23 ENCOUNTER — Ambulatory Visit: Admission: RE | Admit: 2024-01-23 | Source: Home / Self Care | Admitting: Gastroenterology

## 2024-01-23 ENCOUNTER — Encounter: Admission: RE | Payer: Self-pay | Source: Home / Self Care

## 2024-01-23 SURGERY — COLONOSCOPY
Anesthesia: General

## 2024-01-27 ENCOUNTER — Encounter: Payer: Self-pay | Admitting: Obstetrics and Gynecology
